# Patient Record
Sex: Male | Born: 1945
Health system: Southern US, Community
[De-identification: ages and names within clinical notes are randomized; demographics above are authoritative.]

## PROBLEM LIST (undated history)

## (undated) DIAGNOSIS — I10 Essential (primary) hypertension: Secondary | ICD-10-CM

## (undated) DIAGNOSIS — K5790 Diverticulosis of intestine, part unspecified, without perforation or abscess without bleeding: Secondary | ICD-10-CM

## (undated) DIAGNOSIS — K219 Gastro-esophageal reflux disease without esophagitis: Secondary | ICD-10-CM

## (undated) DIAGNOSIS — J45909 Unspecified asthma, uncomplicated: Secondary | ICD-10-CM

## (undated) DIAGNOSIS — E785 Hyperlipidemia, unspecified: Secondary | ICD-10-CM

## (undated) DIAGNOSIS — M1990 Unspecified osteoarthritis, unspecified site: Secondary | ICD-10-CM

## (undated) DIAGNOSIS — C801 Malignant (primary) neoplasm, unspecified: Secondary | ICD-10-CM

## (undated) DIAGNOSIS — N400 Enlarged prostate without lower urinary tract symptoms: Secondary | ICD-10-CM

## (undated) HISTORY — DX: Gastro-esophageal reflux disease without esophagitis: K21.9

## (undated) HISTORY — DX: Essential (primary) hypertension: I10

## (undated) HISTORY — DX: Malignant (primary) neoplasm, unspecified: C80.1

## (undated) HISTORY — DX: Unspecified asthma, uncomplicated: J45.909

## (undated) HISTORY — DX: Unspecified osteoarthritis, unspecified site: M19.90

## (undated) HISTORY — DX: Benign prostatic hyperplasia without lower urinary tract symptoms: N40.0

## (undated) HISTORY — DX: Hyperlipidemia, unspecified: E78.5

## (undated) HISTORY — DX: Diverticulosis of intestine, part unspecified, without perforation or abscess without bleeding: K57.90

## (undated) HISTORY — PX: COLONOSCOPY: SHX174

---

## 2000-01-25 ENCOUNTER — Emergency Department (HOSPITAL_COMMUNITY): Admission: EM | Admit: 2000-01-25 | Discharge: 2000-01-25 | Payer: Self-pay | Admitting: Emergency Medicine

## 2000-02-20 ENCOUNTER — Encounter: Payer: Self-pay | Admitting: Gastroenterology

## 2001-12-30 ENCOUNTER — Ambulatory Visit (HOSPITAL_COMMUNITY): Admission: RE | Admit: 2001-12-30 | Discharge: 2001-12-30 | Payer: Self-pay | Admitting: *Deleted

## 2004-07-28 ENCOUNTER — Ambulatory Visit: Payer: Self-pay | Admitting: Gastroenterology

## 2004-08-19 ENCOUNTER — Ambulatory Visit: Payer: Self-pay | Admitting: Gastroenterology

## 2004-08-19 LAB — HM COLONOSCOPY: HM Colonoscopy: NORMAL

## 2005-12-25 ENCOUNTER — Emergency Department (HOSPITAL_COMMUNITY): Admission: EM | Admit: 2005-12-25 | Discharge: 2005-12-26 | Payer: Self-pay | Admitting: Emergency Medicine

## 2006-03-27 ENCOUNTER — Ambulatory Visit: Payer: Self-pay | Admitting: Family Medicine

## 2006-03-29 ENCOUNTER — Ambulatory Visit: Payer: Self-pay | Admitting: Family Medicine

## 2006-06-11 ENCOUNTER — Ambulatory Visit: Payer: Self-pay | Admitting: Family Medicine

## 2006-08-13 ENCOUNTER — Ambulatory Visit: Payer: Self-pay | Admitting: Family Medicine

## 2006-12-31 ENCOUNTER — Ambulatory Visit: Payer: Self-pay | Admitting: Family Medicine

## 2007-01-01 ENCOUNTER — Ambulatory Visit: Payer: Self-pay | Admitting: Family Medicine

## 2007-02-12 ENCOUNTER — Ambulatory Visit: Payer: Self-pay | Admitting: Family Medicine

## 2007-04-16 ENCOUNTER — Ambulatory Visit: Payer: Self-pay | Admitting: Family Medicine

## 2007-05-01 ENCOUNTER — Ambulatory Visit: Payer: Self-pay | Admitting: Family Medicine

## 2007-06-04 ENCOUNTER — Ambulatory Visit: Payer: Self-pay | Admitting: Family Medicine

## 2007-08-28 ENCOUNTER — Ambulatory Visit: Payer: Self-pay | Admitting: Family Medicine

## 2007-09-30 ENCOUNTER — Ambulatory Visit: Payer: Self-pay | Admitting: Family Medicine

## 2008-01-29 ENCOUNTER — Ambulatory Visit: Payer: Self-pay | Admitting: Family Medicine

## 2008-03-02 ENCOUNTER — Ambulatory Visit: Payer: Self-pay | Admitting: Family Medicine

## 2008-03-10 ENCOUNTER — Ambulatory Visit: Payer: Self-pay | Admitting: Family Medicine

## 2008-05-04 ENCOUNTER — Ambulatory Visit: Payer: Self-pay | Admitting: Family Medicine

## 2008-09-16 ENCOUNTER — Ambulatory Visit: Payer: Self-pay | Admitting: Family Medicine

## 2009-01-18 ENCOUNTER — Ambulatory Visit: Payer: Self-pay | Admitting: Family Medicine

## 2009-01-29 ENCOUNTER — Ambulatory Visit: Payer: Self-pay | Admitting: Family Medicine

## 2009-01-29 ENCOUNTER — Encounter: Payer: Self-pay | Admitting: Gastroenterology

## 2009-01-29 ENCOUNTER — Telehealth: Payer: Self-pay | Admitting: Gastroenterology

## 2009-02-01 DIAGNOSIS — E7800 Pure hypercholesterolemia, unspecified: Secondary | ICD-10-CM | POA: Insufficient documentation

## 2009-02-01 DIAGNOSIS — E1169 Type 2 diabetes mellitus with other specified complication: Secondary | ICD-10-CM | POA: Insufficient documentation

## 2009-02-01 DIAGNOSIS — E1159 Type 2 diabetes mellitus with other circulatory complications: Secondary | ICD-10-CM | POA: Insufficient documentation

## 2009-02-01 DIAGNOSIS — I10 Essential (primary) hypertension: Secondary | ICD-10-CM | POA: Insufficient documentation

## 2009-02-01 DIAGNOSIS — K5730 Diverticulosis of large intestine without perforation or abscess without bleeding: Secondary | ICD-10-CM | POA: Insufficient documentation

## 2009-02-01 DIAGNOSIS — J45909 Unspecified asthma, uncomplicated: Secondary | ICD-10-CM | POA: Insufficient documentation

## 2009-02-02 ENCOUNTER — Ambulatory Visit: Payer: Self-pay | Admitting: Gastroenterology

## 2009-02-02 DIAGNOSIS — K219 Gastro-esophageal reflux disease without esophagitis: Secondary | ICD-10-CM | POA: Insufficient documentation

## 2009-02-02 DIAGNOSIS — K921 Melena: Secondary | ICD-10-CM | POA: Insufficient documentation

## 2009-02-19 ENCOUNTER — Ambulatory Visit: Payer: Self-pay | Admitting: Gastroenterology

## 2009-09-15 ENCOUNTER — Ambulatory Visit: Payer: Self-pay | Admitting: Family Medicine

## 2009-11-01 ENCOUNTER — Ambulatory Visit: Payer: Self-pay | Admitting: Family Medicine

## 2009-11-04 ENCOUNTER — Ambulatory Visit: Payer: Self-pay | Admitting: Family Medicine

## 2009-12-06 ENCOUNTER — Ambulatory Visit: Payer: Self-pay | Admitting: Family Medicine

## 2010-02-23 ENCOUNTER — Ambulatory Visit: Payer: Self-pay | Admitting: Family Medicine

## 2010-03-15 ENCOUNTER — Ambulatory Visit: Admission: RE | Admit: 2010-03-15 | Discharge: 2010-06-13 | Payer: Self-pay | Admitting: Radiation Oncology

## 2010-06-13 ENCOUNTER — Ambulatory Visit: Admission: RE | Admit: 2010-06-13 | Discharge: 2010-06-30 | Payer: Self-pay | Admitting: Radiation Oncology

## 2010-07-28 ENCOUNTER — Ambulatory Visit: Payer: Self-pay | Admitting: Family Medicine

## 2010-08-31 ENCOUNTER — Ambulatory Visit
Admission: RE | Admit: 2010-08-31 | Discharge: 2010-08-31 | Payer: Self-pay | Source: Home / Self Care | Attending: Family Medicine | Admitting: Family Medicine

## 2010-09-12 ENCOUNTER — Ambulatory Visit
Admission: RE | Admit: 2010-09-12 | Discharge: 2010-09-12 | Payer: Self-pay | Source: Home / Self Care | Attending: Family Medicine | Admitting: Family Medicine

## 2010-12-26 ENCOUNTER — Other Ambulatory Visit: Payer: Self-pay

## 2010-12-26 ENCOUNTER — Telehealth: Payer: Self-pay | Admitting: Family Medicine

## 2010-12-26 NOTE — Telephone Encounter (Signed)
Called med in to [facility]

## 2010-12-26 NOTE — Telephone Encounter (Signed)
Called [facility] to put in omprazole 40 mg 1 qd # 90 with 4 rf

## 2010-12-30 NOTE — Procedures (Signed)
[facility]  Patient:    Ernest Hughes, Ernest Hughes Visit Number: [id number] MRN: [id number]          Service Type: END Location: ENDO Attending Physician:  Sabino Gasser Dictated by:   Sabino Gasser, M.D. Proc. Date: 12/30/01 Admit Date:  12/30/2001                             Procedure Report  PROCEDURE:  Colonoscopy.  INDICATION:  Colon cancer screening.  ANESTHESIA:  Demerol 70, Versed 7 mg.  DESCRIPTION OF PROCEDURE:  With the patient mildly sedated in the left lateral decubitus position, a rectal exam was performed which was unremarkable. Subsequently the Olympus videoscopic colonoscope was inserted into the rectum and passed under direct vision to the cecum, identified by the ileocecal valve and appendiceal orifice, both of which were photographed.  From this point, the colonoscope was slowly withdrawn, taking circumferential views of the entire colonic mucosa, stopping only in the rectum which appeared normal on direct and showed small hemorrhoids on retroflexed view.  The endoscope was straightened and withdrawn.  The patients vital signs and pulse oximeter remained stable.  The patient tolerated the procedure well without apparent complications.  FINDINGS:  Internal hemorrhoids, otherwise unremarkable examination.  PLAN:  Repeat examination in possibly 5-10 years. Dictated by:   Sabino Gasser, M.D. Attending Physician:  Sabino Gasser DD:  12/30/01 TD:  12/30/01 Job: [id number]

## 2011-03-02 ENCOUNTER — Encounter: Payer: Self-pay | Admitting: Family Medicine

## 2011-03-22 ENCOUNTER — Encounter: Payer: Self-pay | Admitting: Medical

## 2011-03-22 ENCOUNTER — Ambulatory Visit (INDEPENDENT_AMBULATORY_CARE_PROVIDER_SITE_OTHER): Payer: Medicare Other | Admitting: Medical

## 2011-03-22 VITALS — BP 130/88 | HR 60 | Temp 97.7°F | Resp 16 | Ht 69.5 in | Wt 156.0 lb

## 2011-03-22 DIAGNOSIS — T63441A Toxic effect of venom of bees, accidental (unintentional), initial encounter: Secondary | ICD-10-CM

## 2011-03-22 DIAGNOSIS — T6391XA Toxic effect of contact with unspecified venomous animal, accidental (unintentional), initial encounter: Secondary | ICD-10-CM

## 2011-03-22 MED ORDER — HYDROCORTISONE 2.5 % EX CREA: TOPICAL_CREAM | Freq: Two times a day (BID) | CUTANEOUS | Status: DC

## 2011-03-22 NOTE — Patient Instructions (Signed)
Apply ice pack to the sting locations  Hydrocortisone cream topically for itching.   Elevate the extremities to reduce swelling.  You can take 1/2- 1 tablet of OTC Benadryl once to twice daily.   If any area becomes more red, hard, painful, hot, or if you have fever, chills, nausea, call or return.  These would be signs of infection.

## 2011-03-22 NOTE — Progress Notes (Signed)
Subjective:   HPI Ernest Hughes is a 65 y.o. male who presents for bee stings.  He was outside Monday and got into some yellow jackets.  They stung him on left hand, left leg, lower back.  Used Benadryl, cream topically.  Wanted to come in to make sure stinger wasn't still in the leg.  Has some redness of the bites.   No other aggravating or relieving factors.  No other c/o.  The following portions of the patient's history were reviewed and updated as appropriate: allergies, current medications, past family history, past medical history, past social history, past surgical history and problem list.  Past Medical History  Diagnosis Date  . GERD (gastroesophageal reflux disease)   . Hypertension   . BPH (benign prostatic hyperplasia)   . Cancer     PROSTATE  . Diverticulosis     Review of Systems Gen: fever, chills, sweats GI: no nausea, vomiting Lungs: no SOB, wheezing Ext: no edema    Objective:   Physical Exam  General appearance: alert, no distress, WD/WN, AA male, pleasant Skin; left medial leg just proximal to knee with silver dollar size area of mild swelling and erythema, central sting marking, but not clear if small 1mm size dark area is stinger or just the puncture site.  There is similar smaller les erythematous patch on left dorsal wrist and lower right back both from sting.  No area is indurated or fluctuance.  They just appear inflamed.  No drainage.     Assessment :    Encounter Diagnosis  Name Primary?  . Bee sting Yes     Plan:    Script for 2.5% hydrocortisone given.  Call/return if not improved in 48 hours.   Patient Instructions  Apply ice pack to the sting locations  Hydrocortisone cream topically for itching.   Elevate the extremities to reduce swelling.  You can take 1/2- 1 tablet of OTC Benadryl once to twice daily.   If any area becomes more red, hard, painful, hot, or if you have fever, chills, nausea, call or return.  These would be signs of  infection.

## 2011-05-22 ENCOUNTER — Encounter: Payer: Self-pay | Admitting: Family Medicine

## 2011-05-22 ENCOUNTER — Ambulatory Visit (INDEPENDENT_AMBULATORY_CARE_PROVIDER_SITE_OTHER): Payer: Medicare Other | Admitting: Family Medicine

## 2011-05-22 VITALS — BP 134/86 | HR 60 | Ht 69.0 in | Wt 185.0 lb

## 2011-05-22 DIAGNOSIS — Z8546 Personal history of malignant neoplasm of prostate: Secondary | ICD-10-CM | POA: Insufficient documentation

## 2011-05-22 DIAGNOSIS — L723 Sebaceous cyst: Secondary | ICD-10-CM

## 2011-05-22 DIAGNOSIS — Z23 Encounter for immunization: Secondary | ICD-10-CM

## 2011-05-22 DIAGNOSIS — Z79899 Other long term (current) drug therapy: Secondary | ICD-10-CM

## 2011-05-22 DIAGNOSIS — C61 Malignant neoplasm of prostate: Secondary | ICD-10-CM

## 2011-05-22 DIAGNOSIS — K219 Gastro-esophageal reflux disease without esophagitis: Secondary | ICD-10-CM

## 2011-05-22 DIAGNOSIS — I10 Essential (primary) hypertension: Secondary | ICD-10-CM

## 2011-05-22 LAB — COMPREHENSIVE METABOLIC PANEL
Albumin: 4.2 g/dL (ref 3.5–5.2)
BUN: 16 mg/dL (ref 6–23)
CO2: 27 mEq/L (ref 19–32)
Glucose, Bld: 90 mg/dL (ref 70–99)
Potassium: 4.3 mEq/L (ref 3.5–5.3)
Sodium: 139 mEq/L (ref 135–145)
Total Protein: 7.2 g/dL (ref 6.0–8.3)

## 2011-05-22 LAB — LIPID PANEL
HDL: 49 mg/dL (ref >39)
LDL Cholesterol: 73 mg/dL (ref 0–99)
Total CHOL/HDL Ratio: 2.8 Ratio
VLDL: 14 mg/dL (ref 0–40)

## 2011-05-22 NOTE — Progress Notes (Signed)
Subjective:    Patient ID: Ernest Hughes, male    DOB: 02/10/1946, 65 y.o.   MRN: [id number]  HPI He is here for his initial Medicare examination. He is being followed by urology and has had radiation. Apparently his PSA is slowly rising. The note from urology was reviewed. He is scheduled for another PSA and possible bone scan in several months. His reflux is giving him very little difficulty Also said some difficulty with some rectal lesions that he would like me to look at. He is using Metamucil to help with some difficulty with constipation. He also said some difficulty with his left eye draining but has seen Dr. Dione Booze who has him on drops. He is retired and he and his wife are helping to take care of the grandchildren and also doing a lot of traveling.   Review of Systems  Constitutional: Negative.   HENT: Negative.   Eyes: Positive for discharge.  Respiratory: Negative.   Cardiovascular: Negative.   Gastrointestinal: Negative.   Musculoskeletal: Negative.   Skin: Negative.   Hematological: Negative.   Psychiatric/Behavioral: Negative.        Objective:   Physical Exam BP 134/86  Pulse 60  Ht 5\' 9"  (1.753 m)  Wt 185 lb (83.915 kg)  BMI 27.32 kg/m2  General Appearance:    Alert, cooperative, no distress, appears stated age  Head:    Normocephalic, without obvious abnormality, atraumatic  Eyes:    PERRL, conjunctiva/corneas clear, EOM's intact, fundi    benign  Ears:    Normal TM's and external ear canals  Nose:   Nares normal, mucosa normal, no drainage or sinus   tenderness  Throat:   Lips, mucosa, and tongue normal; teeth and gums normal  Neck:   Supple, no lymphadenopathy;  thyroid:  no   enlargement/tenderness/nodules; no carotid   bruit or JVD  Back:    Spine nontender, no curvature, ROM normal, no CVA     tenderness  Lungs:     Clear to auscultation bilaterally without wheezes, rales or     ronchi; respirations unlabored  Chest Wall:    No tenderness or deformity     Heart:    Regular rate and rhythm, S1 and S2 normal, no murmur, rub   or gallop  Breast Exam:    No chest wall tenderness, masses or gynecomastia  Abdomen:     Soft, non-tender, nondistended, normoactive bowel sounds,    no masses, no hepatosplenomegaly  Genitalia:    Normal male external genitalia without lesions.  Testicles without masses.  No inguinal hernias.  Rectal:    a small sebaceous cyst that was easily expressed this material is noted at the 9:00 position.   Extremities:   No clubbing, cyanosis or edema  Pulses:   2+ and symmetric all extremities  Skin:   Skin color, texture, turgor normal, no rashes or lesions  Lymph nodes:   Cervical, supraclavicular, and axillary nodes normal  Neurologic:   CNII-XII intact, normal strength, sensation and gait; reflexes 2+ and symmetric throughout          Psych:   Normal mood, affect, hygiene and grooming.           Assessment & Plan:   1. Prostate cancer  CBC with Differential, Comprehensive metabolic panel, Lipid panel  2. Sebaceous cyst    3. GERD (gastroesophageal reflux disease)    4. Hypertension  CBC with Differential, Comprehensive metabolic panel, Lipid panel  5. Encounter for long-term (current)  use of other medications  CBC with Differential, Comprehensive metabolic panel, Lipid panel   Encouraged him to take his medications separate from taking the Metamucil. He will continue his followup with urology. Recheck here as needed.

## 2011-05-23 LAB — CBC WITH DIFFERENTIAL/PLATELET
HCT: 42.8 % (ref 39.0–52.0)
Hemoglobin: 14.4 g/dL (ref 13.0–17.0)
Lymphs Abs: 1.2 10*3/uL (ref 0.7–4.0)
MCH: 29 pg (ref 26.0–34.0)
Monocytes Absolute: 0.3 10*3/uL (ref 0.1–1.0)
Monocytes Relative: 9 % (ref 3–12)
Neutro Abs: 2.1 10*3/uL (ref 1.7–7.7)
Neutrophils Relative %: 57 % (ref 43–77)
RBC: 4.97 MIL/uL (ref 4.22–5.81)

## 2011-09-13 ENCOUNTER — Telehealth: Payer: Self-pay | Admitting: Family Medicine

## 2011-09-13 MED ORDER — AMLODIPINE-OLMESARTAN 5-20 MG PO TABS: 1.0000 | ORAL_TABLET | Freq: Every day | ORAL | Status: DC

## 2011-09-13 NOTE — Telephone Encounter (Signed)
[facility] called in

## 2011-09-14 ENCOUNTER — Telehealth: Payer: Self-pay | Admitting: Internal Medicine

## 2011-09-14 MED ORDER — AMLODIPINE-OLMESARTAN 5-20 MG PO TABS: 1.0000 | ORAL_TABLET | Freq: Every day | ORAL | Status: DC

## 2011-09-14 NOTE — Telephone Encounter (Signed)
Sent to wrong pharmacy redid it and sent to [facility]

## 2011-10-10 ENCOUNTER — Encounter: Payer: Self-pay | Admitting: Family Medicine

## 2011-10-10 ENCOUNTER — Ambulatory Visit (INDEPENDENT_AMBULATORY_CARE_PROVIDER_SITE_OTHER): Payer: Medicare Other | Admitting: Family Medicine

## 2011-10-10 VITALS — BP 110/70 | HR 82 | Temp 97.8°F | Wt 188.0 lb

## 2011-10-10 DIAGNOSIS — J029 Acute pharyngitis, unspecified: Secondary | ICD-10-CM

## 2011-10-10 NOTE — Progress Notes (Signed)
  Subjective:    Patient ID: Ernest Hughes, male    DOB: 25-Nov-1945, 66 y.o.   MRN: [id number]  HPI He has a three-day history that started with diarrhea followed by sore throat and hoarse voice. No earache, fever, chills, cough or congestion until yesterday and now he has a nonproductive.   Review of Systems     Objective:   Physical Exam alert and in no distress. Tympanic membranes and canals are normal. Throat is clear. Tonsils are normal. Neck is supple without adenopathy or thyromegaly. Cardiac exam shows a regular sinus rhythm without murmurs or gallops. Lungs are clear to auscultation. Screen is negative       Assessment & Plan:   1. Sore throat  POCT rapid strep A  2. Pharyngitis, acute     Symptomatic care including using Afrin nasal spray at night.

## 2011-10-10 NOTE — Patient Instructions (Signed)
Afrin nasal spray only at night sleeping brief review nose.

## 2011-10-19 ENCOUNTER — Other Ambulatory Visit: Payer: Self-pay | Admitting: Family Medicine

## 2011-11-28 ENCOUNTER — Ambulatory Visit (INDEPENDENT_AMBULATORY_CARE_PROVIDER_SITE_OTHER): Payer: Medicare Other | Admitting: Family Medicine

## 2011-11-28 ENCOUNTER — Encounter: Payer: Self-pay | Admitting: Family Medicine

## 2011-11-28 VITALS — BP 130/82 | HR 72 | Wt 189.0 lb

## 2011-11-28 DIAGNOSIS — R1310 Dysphagia, unspecified: Secondary | ICD-10-CM

## 2011-11-28 DIAGNOSIS — D1779 Benign lipomatous neoplasm of other sites: Secondary | ICD-10-CM

## 2011-11-28 DIAGNOSIS — K130 Diseases of lips: Secondary | ICD-10-CM

## 2011-11-28 DIAGNOSIS — D171 Benign lipomatous neoplasm of skin and subcutaneous tissue of trunk: Secondary | ICD-10-CM

## 2011-11-28 NOTE — Patient Instructions (Signed)
Call when you need the patches for your trip. Keep him on the lesion on her lip. If it does not go away, I will send you to a dermatologist. Make sure you chew your food really well.

## 2011-11-28 NOTE — Progress Notes (Signed)
  Subjective:    Patient ID: Ernest Hughes, male    DOB: August 13, 1946, 66 y.o.   MRN: [id number]  HPI He is here for consult concerning several issues. He has a several week history of a slightly swollen area on the mid upper lip. He thinks it has gotten slightly smaller. He also has a lesion on his back and he would like me to look at. He also complains of intermittent difficulty with swallowing. He feels as if food is going down the wrong pipe however he describes drinking water and helping eliminate this symptom. He's had no nausea, vomiting, abdominal pain.   Review of Systems     Objective:   Physical Exam Alert and in no distress. Slightly erythematous raised lesion is noted over the midportion of the upper lip. It is approximately 0.5 cm in size. Exam of his back does show a round smooth movable lesion over the mid back area on the right approximately 4 cm in size      Assessment & Plan:   1. Lipoma of back   2. Dysphagia   3. Lip lesion    I reassured him that nothing needed to be done for the lipoma. He is to watch the lip lesion in his feet has difficulty, he will call for dermatology referral. In terms of the swallowing difficulty, iron recommended that he chew his food better and have smaller sizes. If he has further difficulty, he will call.

## 2011-12-26 ENCOUNTER — Telehealth: Payer: Self-pay | Admitting: Family Medicine

## 2011-12-26 NOTE — Telephone Encounter (Signed)
Called in per jcl 

## 2011-12-26 NOTE — Telephone Encounter (Signed)
Call in Transderm-Scop a total of 4 patches. Use one every 3 days. Have him and his wife both use them

## 2012-03-08 ENCOUNTER — Other Ambulatory Visit: Payer: Self-pay | Admitting: Family Medicine

## 2012-05-02 ENCOUNTER — Ambulatory Visit (INDEPENDENT_AMBULATORY_CARE_PROVIDER_SITE_OTHER): Payer: Medicare Other | Admitting: Family Medicine

## 2012-05-02 ENCOUNTER — Encounter: Payer: Self-pay | Admitting: Family Medicine

## 2012-05-02 VITALS — BP 118/70 | HR 60 | Wt 186.0 lb

## 2012-05-02 DIAGNOSIS — L259 Unspecified contact dermatitis, unspecified cause: Secondary | ICD-10-CM

## 2012-05-02 DIAGNOSIS — Z23 Encounter for immunization: Secondary | ICD-10-CM

## 2012-05-02 DIAGNOSIS — R600 Localized edema: Secondary | ICD-10-CM

## 2012-05-02 DIAGNOSIS — L309 Dermatitis, unspecified: Secondary | ICD-10-CM

## 2012-05-02 DIAGNOSIS — R609 Edema, unspecified: Secondary | ICD-10-CM

## 2012-05-02 NOTE — Progress Notes (Signed)
  Subjective:    Patient ID: Ernest Hughes, male    DOB: Jun 28, 1946, 66 y.o.   MRN: [id number]  HPI A week ago he noted swelling to the right face as well as numbness. He has seen no rash in this area. There's been no change with eating, earache, difficulty swallowing. He did use Benadryl. He also has some lesions present on his face underneath his beard that have been giving him difficulty.   Review of Systems     Objective:   Physical Exam The right masseter area does appear to be slightly more prominent than left however palpation shows no changes. No submandibular adenopathy or lesions. Throat is clear. Exam of the lips does show some slight erythema and raised lesions.       Assessment & Plan:   1. Dermatitis  Ambulatory referral to Dermatology  2. Facial edema     etiology of the facial lesions is unclear. The edema is probably perception rather than reality. Explained that I did not find anything wrong with his jaw, salivary glands or lymph nodes. Recommend watchful waiting for this. Flu shot was given. Discussed benefits and risks.

## 2012-06-17 ENCOUNTER — Encounter: Payer: Self-pay | Admitting: Family Medicine

## 2012-06-17 ENCOUNTER — Ambulatory Visit (INDEPENDENT_AMBULATORY_CARE_PROVIDER_SITE_OTHER): Payer: Medicare Other | Admitting: Family Medicine

## 2012-06-17 VITALS — BP 128/80 | HR 60 | Wt 188.0 lb

## 2012-06-17 DIAGNOSIS — H6120 Impacted cerumen, unspecified ear: Secondary | ICD-10-CM

## 2012-06-17 DIAGNOSIS — K118 Other diseases of salivary glands: Secondary | ICD-10-CM

## 2012-06-17 NOTE — Progress Notes (Signed)
  Subjective:    Patient ID: Ernest Hughes, male    DOB: 06/07/1946, 66 y.o.   MRN: [id number]  HPI He is again having difficulty with fullness sensation in the right mandibular area. He cannot particularly relate this to any particular foods or activities. He does complain of some decreased hearing on the right.  Review of Systems     Objective:   Physical Exam The right tympanic membrane is normal after cerumen was removed from the ear. Left canal and TM normal. Full since sedation noted in the right salivary gland area. No tenderness to palpation. No submandibular adenopathy or lesions. Exam of the oral mucosa shows no lesions.       Assessment & Plan:   1. Salivary duct obstruction   2. Cerumen impaction    recommend he use lemon drops regularly for the next several days and if continued difficulty, ENT referral will be made.

## 2012-06-17 NOTE — Patient Instructions (Signed)
Use lemon drops for the next several days and if no improvement, call me and I will refer you to ENT

## 2012-06-21 ENCOUNTER — Ambulatory Visit (INDEPENDENT_AMBULATORY_CARE_PROVIDER_SITE_OTHER): Payer: Medicare Other | Admitting: Family Medicine

## 2012-06-21 DIAGNOSIS — H1130 Conjunctival hemorrhage, unspecified eye: Secondary | ICD-10-CM

## 2012-06-21 DIAGNOSIS — K118 Other diseases of salivary glands: Secondary | ICD-10-CM

## 2012-06-21 NOTE — Progress Notes (Signed)
  Subjective:    Patient ID: Ernest Hughes, male    DOB: 1946-03-05, 66 y.o.   MRN: [id number]  HPI He is here for consult concerning redness in his right eye. He has no history of injury to this. He's had no coughing or sneezing. He also continues to have difficulty with right mandibular swelling that has not responded to limit drops.   Review of Systems     Objective:   Physical Exam Alert and in no distress. Right conjunctiva is red. Anterior chamber and cornea is normal. The right mandibular area is still slightly swollen.       Assessment & Plan:   1. Conjunctival hemorrhage    2. Other specified diseases of the salivary glands  Ambulatory referral to ENT   I reassured him that the hemorrhage was probably spontaneous another indication of any underlying disease. Recommended watchful waiting.

## 2012-09-12 ENCOUNTER — Telehealth: Payer: Self-pay | Admitting: Family Medicine

## 2012-09-12 MED ORDER — ATORVASTATIN CALCIUM 10 MG PO TABS: 10.0000 mg | ORAL_TABLET | Freq: Every day | ORAL | Status: DC

## 2012-09-12 NOTE — Telephone Encounter (Signed)
done

## 2012-10-17 ENCOUNTER — Encounter: Payer: Self-pay | Admitting: Family Medicine

## 2012-10-17 ENCOUNTER — Ambulatory Visit (INDEPENDENT_AMBULATORY_CARE_PROVIDER_SITE_OTHER): Payer: Medicare Other | Admitting: Family Medicine

## 2012-10-17 VITALS — BP 130/80 | HR 72 | Temp 98.5°F | Wt 186.0 lb

## 2012-10-17 DIAGNOSIS — A084 Viral intestinal infection, unspecified: Secondary | ICD-10-CM

## 2012-10-17 DIAGNOSIS — A088 Other specified intestinal infections: Secondary | ICD-10-CM

## 2012-10-17 NOTE — Patient Instructions (Signed)
Viral Gastroenteritis Viral gastroenteritis is also known as stomach flu. This condition affects the stomach and intestinal tract. It can cause sudden diarrhea and vomiting. The illness typically lasts 3 to 8 days. Most people develop an immune response that eventually gets rid of the virus. While this natural response develops, the virus can make you quite ill. CAUSES  Many different viruses can cause gastroenteritis, such as rotavirus or noroviruses. You can catch one of these viruses by consuming contaminated food or water. You may also catch a virus by sharing utensils or other personal items with an infected person or by touching a contaminated surface. SYMPTOMS  The most common symptoms are diarrhea and vomiting. These problems can cause a severe loss of body fluids (dehydration) and a body salt (electrolyte) imbalance. Other symptoms may include:  Fever.  Headache.  Fatigue.  Abdominal pain. DIAGNOSIS  Your caregiver can usually diagnose viral gastroenteritis based on your symptoms and a physical exam. A stool sample may also be taken to test for the presence of viruses or other infections. TREATMENT  This illness typically goes away on its own. Treatments are aimed at rehydration. The most serious cases of viral gastroenteritis involve vomiting so severely that you are not able to keep fluids down. In these cases, fluids must be given through an intravenous line (IV). HOME CARE INSTRUCTIONS   Drink enough fluids to keep your urine clear or pale yellow. Drink small amounts of fluids frequently and increase the amounts as tolerated.  Ask your caregiver for specific rehydration instructions.  Avoid:  Foods high in sugar.  Alcohol.  Carbonated drinks.  Tobacco.  Juice.  Caffeine drinks.  Extremely hot or cold fluids.  Fatty, greasy foods.  Too much intake of anything at one time.  Dairy products until 24 to 48 hours after diarrhea stops.  You may consume probiotics.  Probiotics are active cultures of beneficial bacteria. They may lessen the amount and number of diarrheal stools in adults. Probiotics can be found in yogurt with active cultures and in supplements.  Wash your hands well to avoid spreading the virus.  Only take over-the-counter or prescription medicines for pain, discomfort, or fever as directed by your caregiver. Do not give aspirin to children. Antidiarrheal medicines are not recommended.  Ask your caregiver if you should continue to take your regular prescribed and over-the-counter medicines.  Keep all follow-up appointments as directed by your caregiver. SEEK IMMEDIATE MEDICAL CARE IF:   You are unable to keep fluids down.  You do not urinate at least once every 6 to 8 hours.  You develop shortness of breath.  You notice blood in your stool or vomit. This may look like coffee grounds.  You have abdominal pain that increases or is concentrated in one small area (localized).  You have persistent vomiting or diarrhea.  You have a fever.  The patient is a child younger than 3 months, and he or she has a fever.  The patient is a child older than 3 months, and he or she has a fever and persistent symptoms.  The patient is a child older than 3 months, and he or she has a fever and symptoms suddenly get worse.  The patient is a baby, and he or she has no tears when crying. MAKE SURE YOU:   Understand these instructions.  Will watch your condition.  Will get help right away if you are not doing well or get worse. Document Released: 07/31/2005 Document Revised: 10/23/2011 Document Reviewed: 05/17/2011   Exit[employer] Patient Information 2013 [place], [place]. Use Imodium help slow you down

## 2012-10-17 NOTE — Progress Notes (Signed)
  Subjective:    Patient ID: Ernest Hughes., male    DOB: Dec 10, 1945, 67 y.o.   MRN: [id number]  HPI He has a three-day history this started with headache and chills followed by diarrhea,4x day. No nausea or vomiting. No one else around him is gotten sick. He did eat tonight for a church but has not heard of anyone else getting sick. No recent travel. No history of recent antibiotic use. He has been using Advil for the headache.   Review of Systems     Objective:   Physical Exam alert and in no distress. Tympanic membranes and canals are normal. Throat is clear. Tonsils are normal. Neck is supple without adenopathy or thyromegaly. Cardiac exam shows a regular sinus rhythm without murmurs or gallops. Lungs are clear to auscultation. Donald exam shows active bowel sounds without masses or tenderness.        Assessment & Plan:  Viral gastroenteritis recommend supportive care. Information given concerning proper treatment in his AVS.

## 2012-12-09 ENCOUNTER — Other Ambulatory Visit: Payer: Self-pay

## 2012-12-09 MED ORDER — AMLODIPINE-OLMESARTAN 5-20 MG PO TABS: 1.0000 | ORAL_TABLET | Freq: Every day | ORAL | Status: DC

## 2012-12-09 NOTE — Telephone Encounter (Signed)
SENT IN [place]

## 2012-12-11 ENCOUNTER — Encounter: Payer: Self-pay | Admitting: Internal Medicine

## 2012-12-30 ENCOUNTER — Ambulatory Visit (INDEPENDENT_AMBULATORY_CARE_PROVIDER_SITE_OTHER): Payer: Medicare Other | Admitting: Family Medicine

## 2012-12-30 ENCOUNTER — Encounter: Payer: Self-pay | Admitting: Family Medicine

## 2012-12-30 VITALS — BP 114/70 | HR 68 | Ht 67.0 in | Wt 180.0 lb

## 2012-12-30 DIAGNOSIS — K219 Gastro-esophageal reflux disease without esophagitis: Secondary | ICD-10-CM

## 2012-12-30 DIAGNOSIS — K5730 Diverticulosis of large intestine without perforation or abscess without bleeding: Secondary | ICD-10-CM

## 2012-12-30 DIAGNOSIS — C61 Malignant neoplasm of prostate: Secondary | ICD-10-CM

## 2012-12-30 DIAGNOSIS — E7800 Pure hypercholesterolemia, unspecified: Secondary | ICD-10-CM

## 2012-12-30 DIAGNOSIS — I10 Essential (primary) hypertension: Secondary | ICD-10-CM

## 2012-12-30 DIAGNOSIS — Z79899 Other long term (current) drug therapy: Secondary | ICD-10-CM

## 2012-12-30 DIAGNOSIS — C44319 Basal cell carcinoma of skin of other parts of face: Secondary | ICD-10-CM

## 2012-12-30 DIAGNOSIS — C44310 Basal cell carcinoma of skin of unspecified parts of face: Secondary | ICD-10-CM

## 2012-12-30 LAB — CBC WITH DIFFERENTIAL/PLATELET
Basophils Absolute: 0 10*3/uL (ref 0.0–0.1)
Basophils Relative: 1 % (ref 0–1)
MCHC: 33.7 g/dL (ref 30.0–36.0)
Monocytes Absolute: 0.3 10*3/uL (ref 0.1–1.0)
Neutro Abs: 1.6 10*3/uL — ABNORMAL LOW (ref 1.7–7.7)
Neutrophils Relative %: 44 % (ref 43–77)
Platelets: 168 10*3/uL (ref 150–400)
RDW: 15.7 % — ABNORMAL HIGH (ref 11.5–15.5)

## 2012-12-30 LAB — POCT URINALYSIS DIPSTICK
Blood, UA: NEGATIVE
Glucose, UA: NEGATIVE
Ketones, UA: NEGATIVE
Spec Grav, UA: 1.02

## 2012-12-30 MED ORDER — ATORVASTATIN CALCIUM 10 MG PO TABS: 10.0000 mg | ORAL_TABLET | Freq: Every day | ORAL | Status: DC

## 2012-12-30 MED ORDER — OMEPRAZOLE 40 MG PO CPDR: DELAYED_RELEASE_CAPSULE | ORAL | Status: DC

## 2012-12-30 MED ORDER — AMLODIPINE-OLMESARTAN 5-20 MG PO TABS: 1.0000 | ORAL_TABLET | Freq: Every day | ORAL | Status: DC

## 2012-12-30 NOTE — Progress Notes (Signed)
  Subjective:    Patient ID: Ernest Hughes., male    DOB: 1945/09/12, 67 y.o.   MRN: [id number]  HPI He is here for a complete examination. He does need a refill on his blood pressure medication. He is up-to-date on his immunizations and has had a colonoscopy. He gets routine followup concerning his prostate cancer.  He does have reflux disease but does not have symptoms on a regular basis. Continues on his Lipitor without difficulties. He has had some discomfort with his right foot but finds that wearing an orthotic does help. He also has a lesion present on his nose that apparently is not healing properly. He is retired and enjoying his retirement. Family and social history were reviewed.   Review of Systems Negative except as above    Objective:   Physical Exam BP 114/70  Pulse 68  Ht 5\' 7"  (1.702 m)  Wt 180 lb (81.647 kg)  BMI 28.19 kg/m2  General Appearance:    Alert, cooperative, no distress, appears stated age  Head:    Normocephalic, without obvious abnormality, atraumatic  Eyes:    PERRL, conjunctiva/corneas clear, EOM's intact, fundi    benign  Ears:    Normal TM's and external ear canals  Nose:   Nares normal, mucosa normal, no drainage or sinus   tenderness  Throat:   Lips, mucosa, and tongue normal; teeth and gums normal  Neck:   Supple, no lymphadenopathy;  thyroid:  no   enlargement/tenderness/nodules; no carotid   bruit or JVD  Back:    Spine nontender, no curvature, ROM normal, no CVA     tenderness  Lungs:     Clear to auscultation bilaterally without wheezes, rales or     ronchi; respirations unlabored  Chest Wall:    No tenderness or deformity   Heart:    Regular rate and rhythm, S1 and S2 normal, no murmur, rub   or gallop  Breast Exam:    No chest wall tenderness, masses or gynecomastia  Abdomen:     Soft, non-tender, nondistended, normoactive bowel sounds,    no masses, no hepatosplenomegaly  Genitalia:  deferred  Rectal:  deferred  Extremities:   No  clubbing, cyanosis or edema  Pulses:   2+ and symmetric all extremities  Skin:   Skin color, texture, turgor normal, no rashes. A very small round slightly waxy lesion is noted on the tip of the nose.  Lymph nodes:   Cervical, supraclavicular, and axillary nodes normal  Neurologic:   CNII-XII intact, normal strength, sensation and gait; reflexes 2+ and symmetric throughout          Psych:   Normal mood, affect, hygiene and grooming.          Assessment & Plan:  HYPERTENSION - Plan: POCT urinalysis dipstick, amLODipine-olmesartan (AZOR) 5-20 MG per tablet, CBC with Differential, Comprehensive metabolic panel  GERD - Plan: omeprazole (PRILOSEC) 40 MG capsule  HYPERCHOLESTEROLEMIA - Plan: atorvastatin (LIPITOR) 10 MG tablet, Lipid panel  DIVERTICULOSIS, COLON  Prostate cancer  BCE (basal cell epithelioma), face - Plan: Ambulatory referral to Dermatology  Encounter for long-term (current) use of other medications - Plan: CBC with Differential, Comprehensive metabolic panel, Lipid panel I encouraged him to continue to take good care of himself. Also recommend using the PPI on an as-needed basis. He is also to use the orthotic on a regular basis.

## 2012-12-30 NOTE — Progress Notes (Signed)
DR.LUPTON [phone] January 20 2013 AT 9:30 AM

## 2012-12-30 NOTE — Patient Instructions (Addendum)
Use the medicine for acid reflux as needed. Use the arch supports. Don't change you stance  When you play golf

## 2012-12-31 LAB — COMPREHENSIVE METABOLIC PANEL
AST: 16 U/L (ref 0–37)
Albumin: 3.8 g/dL (ref 3.5–5.2)
Alkaline Phosphatase: 58 U/L (ref 39–117)
BUN: 14 mg/dL (ref 6–23)
Potassium: 4 mEq/L (ref 3.5–5.3)
Sodium: 140 mEq/L (ref 135–145)

## 2012-12-31 LAB — LIPID PANEL
HDL: 43 mg/dL (ref >39)
LDL Cholesterol: 82 mg/dL (ref 0–99)
Total CHOL/HDL Ratio: 3.3 Ratio

## 2012-12-31 NOTE — Progress Notes (Signed)
Quick Note:  CALLED PT CELL # LEFT MESSAGE LABS LOOK GOOD CONTINUE ON PRESENT MEDS AND TO REVIEW LABS PLEASE GO ON MY CHART ______

## 2013-03-13 ENCOUNTER — Encounter: Payer: Self-pay | Admitting: Family Medicine

## 2013-03-13 ENCOUNTER — Ambulatory Visit (INDEPENDENT_AMBULATORY_CARE_PROVIDER_SITE_OTHER): Payer: Medicare Other | Admitting: Family Medicine

## 2013-03-13 VITALS — BP 122/74 | HR 85 | Temp 98.2°F | Wt 187.0 lb

## 2013-03-13 DIAGNOSIS — J019 Acute sinusitis, unspecified: Secondary | ICD-10-CM

## 2013-03-13 DIAGNOSIS — R51 Headache: Secondary | ICD-10-CM

## 2013-03-13 DIAGNOSIS — R519 Headache, unspecified: Secondary | ICD-10-CM

## 2013-03-13 MED ORDER — AMOXICILLIN-POT CLAVULANATE 875-125 MG PO TABS: 1.0000 | ORAL_TABLET | Freq: Two times a day (BID) | ORAL | Status: DC

## 2013-03-13 MED ORDER — TRAMADOL HCL 50 MG PO TABS: 50.0000 mg | ORAL_TABLET | Freq: Three times a day (TID) | ORAL | Status: DC | PRN

## 2013-03-13 NOTE — Progress Notes (Signed)
  Subjective:    Patient ID: Ernest Hughes., male    DOB: 1946-04-13, 67 y.o.   MRN: [id number]  HPI Approximately 4 days ago he noted the onset of nasal congestion followed by headache that has slowly been getting worse, with fever chills and fatigue. He also complains of tooth discomfort. No earache, PND, sore throat. He also complains of left flank pain that is intermittent in nature the last 3 days. No nausea, vomiting, diarrhea, chest pain or shortness of breath. He does have a family reunion scheduled for tomorrow.   Review of Systems     Objective:   Physical Exam alert and in no distress. Tympanic membranes and canals are normal. Throat is clear. Tonsils are normal. Neck is supple without adenopathy or thyromegaly. Cardiac exam shows a regular sinus rhythm without murmurs or gallops. Lungs are clear to auscultation. No chest wall tenderness. Abdominal exam shows no masses or tenderness Nasal mucosa is normal but has tenderness over all sinuses. Exam of his chest and abdomen shows no palpable tenderness or lesions to     Assessment & Plan:  Acute sinusitis - Plan: amoxicillin-clavulanate (AUGMENTIN) 875-125 MG per tablet  Headache - Plan: traMADol (ULTRAM) 50 MG tablet  also discussed the rib pain and at this point the Aleve should help

## 2013-03-13 NOTE — Patient Instructions (Addendum)
Take 2 Aleve twice per day for the aches and pains. I will call in another pain medicine that you can take as well as the Aleve. Take all the antibiotic and if not only back to normal when you finish give me a call. Stay on all your other medications

## 2013-03-18 ENCOUNTER — Other Ambulatory Visit: Payer: Self-pay | Admitting: Family Medicine

## 2013-04-07 ENCOUNTER — Other Ambulatory Visit: Payer: Self-pay | Admitting: Family Medicine

## 2013-05-19 ENCOUNTER — Encounter: Payer: Self-pay | Admitting: Family Medicine

## 2013-05-19 ENCOUNTER — Ambulatory Visit (INDEPENDENT_AMBULATORY_CARE_PROVIDER_SITE_OTHER): Payer: Medicare Other | Admitting: Family Medicine

## 2013-05-19 VITALS — BP 124/74 | HR 88 | Wt 186.0 lb

## 2013-05-19 DIAGNOSIS — IMO0001 Reserved for inherently not codable concepts without codable children: Secondary | ICD-10-CM

## 2013-05-19 DIAGNOSIS — S6390XA Sprain of unspecified part of unspecified wrist and hand, initial encounter: Secondary | ICD-10-CM

## 2013-05-19 DIAGNOSIS — Z23 Encounter for immunization: Secondary | ICD-10-CM

## 2013-05-19 NOTE — Progress Notes (Signed)
  Subjective:    Patient ID: Ernest Hughes., male    DOB: 11-Apr-1946, 67 y.o.   MRN: [id number]  HPI He injured his left third finger at home. He describes having his finger fully extended in getting it caught and rotated. He complains of pain on the palmar surface of the PIP joint when he fully extends it.   Review of Systems     Objective:   Physical Exam Exam of the left third finger shows no swelling with normal motion. Full extension of all joints is normal however pain is elicited over the PIP joint with full extension. Full flexion of all joints is normal. Slight tenderness palpation over the palmar surface of the PIP joint.       Assessment & Plan:  Need for prophylactic vaccination and inoculation against influenza - Plan: Flu vaccine HIGH DOSE PF (Fluzone Tri High dose)  Finger strain, left, initial encounter  I explained that I think that he stretched and the flexor mechanism at the PIP joint but not to the extent that there is any permanent damage. Recommend conservative care mainly with buddy taping. Flu shot given with risks and benefits discussed.

## 2013-08-21 ENCOUNTER — Ambulatory Visit (INDEPENDENT_AMBULATORY_CARE_PROVIDER_SITE_OTHER): Payer: Medicare Other | Admitting: Family Medicine

## 2013-08-21 ENCOUNTER — Encounter: Payer: Self-pay | Admitting: Family Medicine

## 2013-08-21 VITALS — BP 128/84 | HR 81 | Wt 192.0 lb

## 2013-08-21 DIAGNOSIS — M79609 Pain in unspecified limb: Secondary | ICD-10-CM

## 2013-08-21 DIAGNOSIS — M79605 Pain in left leg: Secondary | ICD-10-CM

## 2013-08-21 NOTE — Progress Notes (Signed)
   Subjective:    Patient ID: Ernest Ora., male    DOB: 07-22-1946, 68 y.o.   MRN: [id number]  HPI He complains of a two-week history of left leg pain. He notes this especially at night. He does complain of a tingling sensation to the lateral thigh area. No weakness. He cannot relate this to position or physical activity. He cannot relate anything making it better or worse. He has tried minimal doses of ibuprofen with some benefit.  Review of Systems     Objective:   Physical Exam Alert and in no distress. Full motion of the hip, knees and ankles. Negative straight leg raising. Pulses were normal in the anterior tibial and dorsalis pedis pulse      Assessment & Plan:  Left leg pain  I will have him use 800 mg 3 times a day of ibuprofen and pay attention to anything that makes this better or worse. Reassured him that I found nothing of any major significance. He will return if symptoms continue.

## 2013-08-21 NOTE — Patient Instructions (Signed)
Take 4 Advil 3 times per day for the next 10 days and pay attention to anything that makes it better or worse

## 2013-09-30 ENCOUNTER — Other Ambulatory Visit: Payer: Self-pay | Admitting: Family Medicine

## 2013-12-12 HISTORY — PX: BELPHAROPTOSIS REPAIR: SHX369

## 2013-12-31 ENCOUNTER — Other Ambulatory Visit: Payer: Self-pay | Admitting: Family Medicine

## 2013-12-31 NOTE — Telephone Encounter (Signed)
PATIENT NEEDS AN OFFICE VISIT ASAP.

## 2014-01-09 ENCOUNTER — Telehealth: Payer: Self-pay | Admitting: Family Medicine

## 2014-01-09 DIAGNOSIS — I10 Essential (primary) hypertension: Secondary | ICD-10-CM

## 2014-01-09 MED ORDER — AMLODIPINE-OLMESARTAN 5-20 MG PO TABS: 1.0000 | ORAL_TABLET | Freq: Every day | ORAL | Status: DC

## 2014-01-09 NOTE — Telephone Encounter (Signed)
Fax refill request from [facility]  [phone] azor 5/20 #90 last dispensed on 10/13/13

## 2014-02-03 ENCOUNTER — Other Ambulatory Visit: Payer: Self-pay | Admitting: Family Medicine

## 2014-02-18 ENCOUNTER — Ambulatory Visit (INDEPENDENT_AMBULATORY_CARE_PROVIDER_SITE_OTHER): Payer: Medicare Other | Admitting: Family Medicine

## 2014-02-18 ENCOUNTER — Encounter: Payer: Self-pay | Admitting: Family Medicine

## 2014-02-18 VITALS — BP 122/72 | HR 72 | Temp 99.1°F | Ht 69.5 in | Wt 188.0 lb

## 2014-02-18 DIAGNOSIS — M545 Low back pain, unspecified: Secondary | ICD-10-CM

## 2014-02-18 DIAGNOSIS — R071 Chest pain on breathing: Secondary | ICD-10-CM

## 2014-02-18 DIAGNOSIS — M79605 Pain in left leg: Secondary | ICD-10-CM

## 2014-02-18 DIAGNOSIS — R0789 Other chest pain: Secondary | ICD-10-CM

## 2014-02-18 LAB — POCT URINALYSIS DIPSTICK
Bilirubin, UA: NEGATIVE
Glucose, UA: NEGATIVE
KETONES UA: NEGATIVE
LEUKOCYTES UA: NEGATIVE
NITRITE UA: NEGATIVE
Protein, UA: NEGATIVE
Spec Grav, UA: 1.015
Urobilinogen, UA: NEGATIVE
pH, UA: 5

## 2014-02-18 MED ORDER — NAPROXEN 500 MG PO TABS: ORAL_TABLET | ORAL | Status: DC

## 2014-02-18 NOTE — Patient Instructions (Signed)
Use heat 15 minutes three times daily.  I recommend doing stretches, and massaging the area after applying the heat. Avoid heavy lifting, bending, twisting. Drink plenty of fluids--I cannot guarantee that there isn't a tiny stone contributing to some of your pain, but I think that most all of your pain is muscular.  STOP taking advil.  Instead, take naproxen twice daily with food.  Continue to take your omeprazole daily. You may also use tylenol, if needed for pain, but don't use any other over-the-counter pain medications. If your muscular pain in the left lower back is not improving after a few days, then we should add in a muscle relaxant.  Call us if you feel that you need this.   Back Pain, Adult Low back pain is very common. About 1 in 5 people have back pain.The cause of low back pain is rarely dangerous. The pain often gets better over time.About half of people with a sudden onset of back pain feel better in just 2 weeks. About 8 in 10 people feel better by 6 weeks.  CAUSES Some common causes of back pain include:  Strain of the muscles or ligaments supporting the spine.  Wear and tear (degeneration) of the spinal discs.  Arthritis.  Direct injury to the back. DIAGNOSIS Most of the time, the direct cause of low back pain is not known.However, back pain can be treated effectively even when the exact cause of the pain is unknown.Answering your caregiver's questions about your overall health and symptoms is one of the most accurate ways to make sure the cause of your pain is not dangerous. If your caregiver needs more information, he or she may order lab work or imaging tests (X-rays or MRIs).However, even if imaging tests show changes in your back, this usually does not require surgery. HOME CARE INSTRUCTIONS For many people, back pain returns.Since low back pain is rarely dangerous, it is often a condition that people can learn to [facility] their own.   Remain active. It is  stressful on the back to sit or stand in one place. Do not sit, drive, or stand in one place for more than 30 minutes at a time. Take short walks on level surfaces as soon as pain allows.Try to increase the length of time you walk each day.  Do not stay in bed.Resting more than 1 or 2 days can delay your recovery.  Do not avoid exercise or work.Your body is made to move.It is not dangerous to be active, even though your back may hurt.Your back will likely heal faster if you return to being active before your pain is gone.  Pay attention to your body when you bend and lift. Many people have less discomfortwhen lifting if they bend their knees, keep the load close to their bodies,and avoid twisting. Often, the most comfortable positions are those that put less stress on your recovering back.  Find a comfortable position to sleep. Use a firm mattress and lie on your side with your knees slightly bent. If you lie on your back, put a pillow under your knees.  Only take over-the-counter or prescription medicines as directed by your caregiver. Over-the-counter medicines to reduce pain and inflammation are often the most helpful.Your caregiver may prescribe muscle relaxant drugs.These medicines help dull your pain so you can more quickly return to your normal activities and healthy exercise.  Put ice on the injured area.  Put ice in a plastic bag.  Place a towel between your skin  and the bag.  Leave the ice on for 15-20 minutes, 03-04 times a day for the first 2 to 3 days. After that, ice and heat may be alternated to reduce pain and spasms.  Ask your caregiver about trying back exercises and gentle massage. This may be of some benefit.  Avoid feeling anxious or stressed.Stress increases muscle tension and can worsen back pain.It is important to recognize when you are anxious or stressed and learn ways to manage it.Exercise is a great option. SEEK MEDICAL CARE IF:  You have pain that is  not relieved with rest or medicine.  You have pain that does not improve in 1 week.  You have new symptoms.  You are generally not feeling well. SEEK IMMEDIATE MEDICAL CARE IF:   You have pain that radiates from your back into your legs.  You develop new bowel or bladder control problems.  You have unusual weakness or numbness in your arms or legs.  You develop nausea or vomiting.  You develop abdominal pain.  You feel faint. Document Released: 07/31/2005 Document Revised: 01/30/2012 Document Reviewed: 12/19/2010 [facility] Patient Information 2015 [place], [place]. This information is not intended to replace advice given to you by your health care provider. Make sure you discuss any questions you have with your health care provider.

## 2014-02-18 NOTE — Progress Notes (Signed)
Chief Complaint  Patient presents with  . Hip Pain    started Sunday am. Has pain that goes from his low back on the left side through his hip and down his leg. He is concerned about his kidney. (UA showed trace blood)   Patient presents with complaint of pain at his left hip and down his left leg.  Sometimes he has a throbbing in his lateral ribs also. He is worried about his kidneys, due to his enlarged prostate.  He notes some increased frequency of urination, which he attributes to tea/caffeine, and improved when he cut back.  He sees Dr. Janice Norrie, and PSA was only slightly higher, has f/u scheduled with him in 6 months (he has h/o prostate cancer, treated with radiation). He is compliant with flomax, and feels like his bladder empties well.  He played golf 2 days prior to onset of pain, no known strain, injury or pain.  Sunday  Morning he woke up with the pain, had a hard time getting out of bed.  Pain has gotten progressively worse.  Last night the pain kept him up.  He hadn't played golf since May the 18th (date of eye surgery).  He tried tylenol at first, then started taking advil.  He took 2 advil twice on Monday with some relief of pain.  Yesterday he took 2 in the morning, 1 in the afternoon, and 1 during the night, due to pain.  He took 1 advil this morning.  Denies any abdominal pain--he is cautious about taking too much advil due to his reflux.  No h/o ulcer, just reflux.  He feels like the left thigh (laterally) is sometimes numb/tingly.  Denies any weakness in the leg.  He describes the pain in his lateral back/flank as "throbbing".  Past Medical History  Diagnosis Date  . GERD (gastroesophageal reflux disease)   . Hypertension   . BPH (benign prostatic hyperplasia)   . Cancer     PROSTATE--treated with radiation  . Diverticulosis    Past Surgical History  Procedure Laterality Date  . Colonoscopy      Dr. Sharlett Iles  . Belpharoptosis repair  12/2013    bilateral   History    Social History  . Marital Status: Married    Spouse Name: N/A    Number of Children: N/A  . Years of Education: N/A   Occupational History  . Not on file.   Social History Main Topics  . Smoking status: Never Smoker   . Smokeless tobacco: Never Used  . Alcohol Use: Yes     Comment: has some wine once in a while   . Drug Use: No  . Sexual Activity: Not Currently   Other Topics Concern  . Not on file   Social History Narrative  . No narrative on file   Outpatient Encounter Prescriptions as of 02/18/2014  Medication Sig Note  . amLODipine-olmesartan (AZOR) 5-20 MG per tablet Take 1 tablet by mouth daily.   Marland Kitchen aspirin 81 MG tablet Take 81 mg by mouth daily.     Marland Kitchen atorvastatin (LIPITOR) 10 MG tablet TAKE 1 TABLET BY MOUTH ONCE A DAY   . Ginkgo Biloba (GINKOBA) 40 MG TABS Take 40 mg by mouth daily.    Marland Kitchen ibuprofen (ADVIL,MOTRIN) 200 MG tablet Take 200 mg by mouth every 6 (six) hours as needed. 02/18/2014: Took one today  . omeprazole (PRILOSEC) 40 MG capsule TAKE 1 CAPSULE DAILY   . psyllium (METAMUCIL) 58.6 % powder Take 1  packet by mouth as needed.     . Tamsulosin HCl (FLOMAX) 0.4 MG CAPS Take 0.4 mg by mouth 2 (two) times daily.    . naproxen (NAPROSYN) 500 MG tablet Take regularly for a week, then as needed for pain   . [DISCONTINUED] traMADol (ULTRAM) 50 MG tablet Take 1 tablet (50 mg total) by mouth every 8 (eight) hours as needed for pain.    No Known Allergies  ROS:  Denies fevers, chills, dysuria, urgency, hematuria.  Denies bleeding, bruising, rash.  Denies nausea, vomiting, abdominal pain, bowel changes.  Denies LE weakness.  +mild intermittent numbness L lateral thigh.  No chest pain, palpitations, headaches, dizziness, shortness of breath or other complaints except as noted in HPI.  PHYSICAL EXAM: BP 122/72  Pulse 72  Temp(Src) 99.1 F (37.3 C) (Tympanic)  Ht 5' 9.5" (1.765 m)  Wt 188 lb (85.276 kg)  BMI 27.37 kg/m2 Pleasant, talkative male.  He has moderate  discomfort with position changes, "pulling" in his left lower back. Neck: no lymphadenopathy or spinal tenderness Back: Spine nontender.  No CVA tenderness, no SI joint tenderness.  Sciatic notch nontender.  Mild discomfort with pyriformis stretch on left, but full ROM.   Soft tissue swelling c/w lipoma right mid-lower back (unchanged per pt). Tender at left paraspinous muscles in lower lumbar region Pain laterally and in hamstring with SLR He is tender along his lower lateral ribs Heart: regular rate and rhythm Lungs: clear bilaterally Abdomen: mild tenderness L mid-abdomen. No rebound tenderness, guarding or mass nontender over trochanteric bursa, slight discomfort with IT band stretch, nontender. Neuro: alert and oriented. DTR's symmetric, normal strength, gait Psych: slightly anxious.  Normal hygiene, grooming, speech, eye contact.  Urine--trace blood, otherwise normal.  ASSESSMENT/PLAN:  LBP radiating to left leg - some mild muscle spasm on exam.  heat, stretches, NSAID, massage. consider adding muscle relaxant if not improving - Plan: POCT Urinalysis Dipstick  Left low back pain, with sciatica presence unspecified - Plan: naproxen (NAPROSYN) 500 MG tablet  Chest wall pain - left sided, at lower ribs.  likely muscular strain (related to golf?). Heat, NSAIDs  We discussed the trace blood in the urine (that wasn't present on last check, per computer). Can't rule out small stone, but his pain is more consistent with muscular pain.  Use heat 15 minutes three times daily.  I recommend doing stretches, and massaging the area after applying the heat. Avoid heavy lifting, bending, twisting. Drink plenty of fluids--I cannot guarantee that there isn't a tiny stone contributing to some of your pain, but I think that most all of your pain is muscular.  STOP taking advil.  Instead, take naproxen twice daily with food.  Continue to take your omeprazole daily. You may also use tylenol, if  needed for pain, but don't use any other over-the-counter pain medications. If your muscular pain in the left lower back is not improving after a few days, then we should add in a muscle relaxant.  Call us if you feel that you need this.  Risks/side effects of meds reviewed Ddx of his pain complaints were reviewed.

## 2014-03-03 ENCOUNTER — Other Ambulatory Visit: Payer: Self-pay | Admitting: Family Medicine

## 2014-03-03 NOTE — Telephone Encounter (Signed)
PLEASE, SCHEDULE A OFFICE VISIT TO FOLLOW UP ON YOUR MEDICATIONS ASAP.

## 2014-04-02 ENCOUNTER — Other Ambulatory Visit: Payer: Self-pay | Admitting: Family Medicine

## 2014-04-02 NOTE — Telephone Encounter (Signed)
CALLED AND LEFT MESSAGE ON PT CELL # THAT I COULD SEND IN 30 DAYS OF CHOLESTEROL MED BUT HE NEEDS TO CALL AND MAKE APPOINTMENT FOR MED CHECK OR PHYSICAL LABS NEED TO BE DONE

## 2014-04-02 NOTE — Telephone Encounter (Signed)
DONE

## 2014-05-05 ENCOUNTER — Ambulatory Visit (INDEPENDENT_AMBULATORY_CARE_PROVIDER_SITE_OTHER): Payer: Medicare Other | Admitting: Family Medicine

## 2014-05-05 ENCOUNTER — Encounter: Payer: Self-pay | Admitting: Family Medicine

## 2014-05-05 ENCOUNTER — Telehealth: Payer: Self-pay | Admitting: Family Medicine

## 2014-05-05 VITALS — BP 122/78 | HR 60 | Ht 69.0 in | Wt 189.0 lb

## 2014-05-05 DIAGNOSIS — Z0000 Encounter for general adult medical examination without abnormal findings: Secondary | ICD-10-CM

## 2014-05-05 DIAGNOSIS — E7800 Pure hypercholesterolemia, unspecified: Secondary | ICD-10-CM

## 2014-05-05 DIAGNOSIS — I10 Essential (primary) hypertension: Secondary | ICD-10-CM

## 2014-05-05 DIAGNOSIS — B351 Tinea unguium: Secondary | ICD-10-CM | POA: Insufficient documentation

## 2014-05-05 DIAGNOSIS — Z23 Encounter for immunization: Secondary | ICD-10-CM

## 2014-05-05 DIAGNOSIS — C61 Malignant neoplasm of prostate: Secondary | ICD-10-CM

## 2014-05-05 DIAGNOSIS — K219 Gastro-esophageal reflux disease without esophagitis: Secondary | ICD-10-CM

## 2014-05-05 LAB — POCT URINALYSIS DIPSTICK
Bilirubin, UA: NEGATIVE
Blood, UA: NEGATIVE
Glucose, UA: NEGATIVE
KETONES UA: NEGATIVE
Leukocytes, UA: NEGATIVE
Nitrite, UA: NEGATIVE
Protein, UA: NEGATIVE
Spec Grav, UA: 1.015
Urobilinogen, UA: NEGATIVE
pH, UA: 6

## 2014-05-05 MED ORDER — OMEPRAZOLE 40 MG PO CPDR: DELAYED_RELEASE_CAPSULE | ORAL | Status: DC

## 2014-05-05 MED ORDER — ATORVASTATIN CALCIUM 10 MG PO TABS: ORAL_TABLET | ORAL | Status: DC

## 2014-05-05 MED ORDER — AMLODIPINE-OLMESARTAN 5-20 MG PO TABS: 1.0000 | ORAL_TABLET | Freq: Every day | ORAL | Status: DC

## 2014-05-05 NOTE — Telephone Encounter (Signed)
Pt called advising his right arm is painful from the flu vaccine.  I advised to him to hold a cold compress to arm and take what ever he usually takes for pain.  He will call us tomorrow if no better.  He states he usually takes Advil.

## 2014-05-05 NOTE — Progress Notes (Signed)
Subjective:    Patient ID: Ernest Ora., male    DOB: [birth year], 68 y.o.   MRN: [id number]  HPI He is here for a complete examination. He has been under stress recently dealing with his children and grandchildren. He seems to have pulled himself away from this slightly by getting more involved in taking care of his own concerns and happiness. He does have prostate cancer and did have radiation on this. He is being followed by urology for this. He does have reflux symptoms and is doing well with every other day dosing. He continues on his but pressure medication without difficulty. He does have thickening of the nails and is wondering about medication for this. His immunizations are up to date. He would like a flu shot today. He had a colonoscopy this year. He is having some difficulty with anal discomfort and has been using an antifungal.   Review of Systems  All other systems reviewed and are negative.      Objective:   Physical Exam BP 122/78  Pulse 60  Ht 5\' 9"  (1.753 m)  Wt 189 lb (85.73 kg)  BMI 27.90 kg/m2  SpO2 99%  General Appearance:    Alert, cooperative, no distress, appears stated age  Head:    Normocephalic, without obvious abnormality, atraumatic  Eyes:    PERRL, conjunctiva/corneas clear, EOM's intact, fundi    benign  Ears:    Normal TM's and external ear canals  Nose:   Nares normal, mucosa normal, no drainage or sinus   tenderness  Throat:   Lips, mucosa, and tongue normal; teeth and gums normal  Neck:   Supple, no lymphadenopathy;  thyroid:  no   enlargement/tenderness/nodules; no carotid   bruit or JVD  Back:    Spine nontender, no curvature, ROM normal, no CVA     tenderness  Lungs:     Clear to auscultation bilaterally without wheezes, rales or     ronchi; respirations unlabored  Chest Wall:    No tenderness or deformity   Heart:    Regular rate and rhythm, S1 and S2 normal, no murmur, rub   or gallop  Breast Exam:    No chest wall tenderness, masses  or gynecomastia  Abdomen:     Soft, non-tender, nondistended, normoactive bowel sounds,    no masses, no hepatosplenomegaly    rectal exam shows no lesions.      Extremities:   No clubbing, cyanosis or edema  Pulses:   2+ and symmetric all extremities  Skin:   Skin color, texture, turgor normal, no rashes or lesions  Lymph nodes:   Cervical, supraclavicular, and axillary nodes normal  Neurologic:   CNII-XII intact, normal strength, sensation and gait; reflexes 2+ and symmetric throughout          Psych:   Normal mood, affect, hygiene and grooming.          Assessment & Plan:  Routine general medical examination at a health care facility - Plan: POCT Urinalysis Dipstick, CBC with Differential, Comprehensive metabolic panel, Lipid panel  Need for prophylactic vaccination against Streptococcus pneumoniae (pneumococcus) - Plan: Pneumococcal polysaccharide vaccine 23-valent greater than or equal to 2yo subcutaneous/IM  Need for prophylactic vaccination and inoculation against influenza - Plan: Flu vaccine HIGH DOSE PF (Fluzone Tri High dose)  Prostate cancer - Plan: PSA  HYPERTENSION - Plan: CBC with Differential, Comprehensive metabolic panel, amLODipine-olmesartan (AZOR) 5-20 MG per tablet  HYPERCHOLESTEROLEMIA - Plan: Lipid panel, atorvastatin (LIPITOR) 10  MG tablet  Gastroesophageal reflux disease without esophagitis - Plan: CBC with Differential, Comprehensive metabolic panel, omeprazole (PRILOSEC) 40 MG capsule  Onychomycosis  I discussed treatment of his toenails with medication and the length of time and he is decided not to pursue this. Recommend cortisone cream for the anal area. Continue on present medications.

## 2014-05-06 LAB — CBC WITH DIFFERENTIAL/PLATELET

## 2014-05-06 LAB — COMPREHENSIVE METABOLIC PANEL
ALT: 20 U/L (ref 0–53)
AST: 17 U/L (ref 0–37)
Albumin: 4.1 g/dL (ref 3.5–5.2)
Alkaline Phosphatase: 64 U/L (ref 39–117)
BUN: 18 mg/dL (ref 6–23)
CALCIUM: 9.1 mg/dL (ref 8.4–10.5)
CO2: 24 mEq/L (ref 19–32)
CREATININE: 1.16 mg/dL (ref 0.50–1.35)
Chloride: 104 mEq/L (ref 96–112)
Glucose, Bld: 84 mg/dL (ref 70–99)
Potassium: 4.4 mEq/L (ref 3.5–5.3)
Sodium: 138 mEq/L (ref 135–145)
Total Bilirubin: 0.5 mg/dL (ref 0.2–1.2)
Total Protein: 7.2 g/dL (ref 6.0–8.3)

## 2014-05-06 LAB — LIPID PANEL
CHOL/HDL RATIO: 2.8 ratio
Cholesterol: 139 mg/dL (ref 0–200)
HDL: 50 mg/dL (ref >39)
LDL Cholesterol: 74 mg/dL (ref 0–99)
TRIGLYCERIDES: 76 mg/dL (ref <150)
VLDL: 15 mg/dL (ref 0–40)

## 2014-05-06 LAB — PSA: PSA: 0.64 ng/mL (ref <=4.00)

## 2014-05-07 ENCOUNTER — Other Ambulatory Visit: Payer: Self-pay | Admitting: Family Medicine

## 2014-05-21 ENCOUNTER — Telehealth: Payer: Self-pay | Admitting: Internal Medicine

## 2014-05-21 NOTE — Telephone Encounter (Signed)
Pt called and states he received his blood work and wants to know why his CBC with Diff was cancelled. I asked Denice Paradise why it was cancelled and she said she is not sure but it states that the specimen clotted. Pt wants to know if he can come back in and get his cbc done again

## 2014-05-22 ENCOUNTER — Other Ambulatory Visit: Payer: Self-pay

## 2014-05-22 DIAGNOSIS — K2100 Gastro-esophageal reflux disease with esophagitis, without bleeding: Secondary | ICD-10-CM

## 2014-05-22 NOTE — Telephone Encounter (Signed)
Left message with his wife that he could call and make a nurse visit and have his cbc redrawn order are in system

## 2014-05-22 NOTE — Telephone Encounter (Signed)
Set this up 

## 2014-05-27 ENCOUNTER — Other Ambulatory Visit: Payer: Medicare Other

## 2014-05-27 DIAGNOSIS — K2100 Gastro-esophageal reflux disease with esophagitis, without bleeding: Secondary | ICD-10-CM

## 2014-05-27 LAB — CBC WITH DIFFERENTIAL/PLATELET
BASOS ABS: 0 10*3/uL (ref 0.0–0.1)
BASOS PCT: 1 % (ref 0–1)
EOS ABS: 0.1 10*3/uL (ref 0.0–0.7)
Eosinophils Relative: 2 % (ref 0–5)
HCT: 40.9 % (ref 39.0–52.0)
Hemoglobin: 13.7 g/dL (ref 13.0–17.0)
Lymphocytes Relative: 37 % (ref 12–46)
Lymphs Abs: 1.7 10*3/uL (ref 0.7–4.0)
MCH: 28.8 pg (ref 26.0–34.0)
MCHC: 33.5 g/dL (ref 30.0–36.0)
MCV: 85.9 fL (ref 78.0–100.0)
MONOS PCT: 8 % (ref 3–12)
Monocytes Absolute: 0.4 10*3/uL (ref 0.1–1.0)
NEUTROS ABS: 2.3 10*3/uL (ref 1.7–7.7)
NEUTROS PCT: 52 % (ref 43–77)
PLATELETS: 182 10*3/uL (ref 150–400)
RBC: 4.76 MIL/uL (ref 4.22–5.81)
RDW: 14.6 % (ref 11.5–15.5)
WBC: 4.5 10*3/uL (ref 4.0–10.5)

## 2014-06-03 ENCOUNTER — Encounter: Payer: Self-pay | Admitting: Family Medicine

## 2014-06-03 ENCOUNTER — Ambulatory Visit (INDEPENDENT_AMBULATORY_CARE_PROVIDER_SITE_OTHER): Payer: Medicare Other | Admitting: Family Medicine

## 2014-06-03 VITALS — BP 122/78 | HR 80 | Temp 98.8°F | Ht 69.25 in | Wt 190.0 lb

## 2014-06-03 DIAGNOSIS — M545 Low back pain, unspecified: Secondary | ICD-10-CM

## 2014-06-03 MED ORDER — NAPROXEN 500 MG PO TABS: 500.0000 mg | ORAL_TABLET | Freq: Two times a day (BID) | ORAL | Status: DC

## 2014-06-03 NOTE — Progress Notes (Signed)
Chief Complaint  Patient presents with  . back pain    hurt back playing golf yesterday. lower back pain. took tynelol once got back to house and then took 2 more later on. no relief   While swinging his driver playing golf yesterday, he developed acute onset of pain across the lower back (bilaterally).  Started as a stinging; wasn't able to bend down to pick up his tee, hard to straighten himself up.  He tried stretching some.  He was unable to complete the last 4 holes.  He took 2 ibuprofen when he got home from golf, which helped some.  He didn't try ice or heat.  When he got up at 3 am to go to the bathroom he had more pain, so he took more advil.  He feels better lying flat on his back than on his side. Currently pain is 6/10.  Denies radiation of pain, no numbness or tingling, denies weakness.  No bowel or bladder dysfunction.  PMH, PSH, SOC were reviewed  Outpatient Encounter Prescriptions as of 06/03/2014  Medication Sig Note  . amLODipine-olmesartan (AZOR) 5-20 MG per tablet Take 1 tablet by mouth daily.   Marland Kitchen aspirin 81 MG tablet Take 81 mg by mouth daily.     Marland Kitchen atorvastatin (LIPITOR) 10 MG tablet TAKE 1 TABLET BY MOUTH ONCE A DAY   . Ginkgo Biloba (GINKOBA) 40 MG TABS Take 40 mg by mouth daily.    Marland Kitchen omeprazole (PRILOSEC) 40 MG capsule TAKE 1 CAPSULE DAILY   . psyllium (METAMUCIL) 58.6 % powder Take 1 packet by mouth as needed.     . Tamsulosin HCl (FLOMAX) 0.4 MG CAPS Take 0.4 mg by mouth 2 (two) times daily.    Marland Kitchen ibuprofen (ADVIL,MOTRIN) 200 MG tablet Take 200 mg by mouth every 6 (six) hours as needed. 06/03/2014: Took 2 yesterday and 2 at 3am today  also has been taking Coricidin (last dose was 2 nights ago)  ROS:  Denies fevers, chills, numbness, tingling, weakness, bowel or bladder problems.  No URI symptoms, chest pain, shortness of breath or other concerns, except as noted in HPI  PHYSICAL EXAM:  BP 122/78  Pulse 80  Temp(Src) 98.8 F (37.1 C)  Ht 5' 9.25" (1.759 m)  Wt  190 lb (86.183 kg)  BMI 27.85 kg/m2  Well developed, pleasant older male in no distress Back: mild tenderness across entire lumbar spine.  He has mild tenderness at paraspinous muscles bilaterally (more lateral, not those right next to spine), only minimal spasm noted.  Very mild R SI joint tenderness. nontender at sciatic notch.  Strength 5/5, normal sensation.  DTR's 2+ and symmetric.  Negative straight leg raise  ASSESSMENT/PLAN:  Bilateral low back pain without sciatica - Plan: naproxen (NAPROSYN) 500 MG tablet  Suspect strain.  No evidence of radiculopathy. Supportive measures.  Reviewed proper lifting. NSAID precautions were reviewed with the patient.  Patient should take medication with food, discontinue if develops GI side effects, not to take other OTC NSAIDs at the same time, and not to use longer than recommended.   STOP taking advil.  Instead, take naproxen twice daily with food. Continue to take your omeprazole daily.  You may also use tylenol, if needed for pain, but don't use any other over-the-counter pain medications.  If your muscular pain in the left lower back is not improving after a few days, then we should add in a muscle relaxant. Call us if you feel that you need this.

## 2014-06-03 NOTE — Patient Instructions (Signed)
STOP taking advil.  Instead, take naproxen twice daily with food. Continue to take your omeprazole daily.  You may also use tylenol, if needed for pain, but don't use any other over-the-counter pain medications.  If your muscular pain in the left lower back is not improving after a few days, then we should add in a muscle relaxant. Call us if you feel that you need this.   Back Pain, Adult Low back pain is very common. About 1 in 5 people have back pain.The cause of low back pain is rarely dangerous. The pain often gets better over time.About half of people with a sudden onset of back pain feel better in just 2 weeks. About 8 in 10 people feel better by 6 weeks.  CAUSES Some common causes of back pain include:  Strain of the muscles or ligaments supporting the spine.  Wear and tear (degeneration) of the spinal discs.  Arthritis.  Direct injury to the back. DIAGNOSIS Most of the time, the direct cause of low back pain is not known.However, back pain can be treated effectively even when the exact cause of the pain is unknown.Answering your caregiver's questions about your overall health and symptoms is one of the most accurate ways to make sure the cause of your pain is not dangerous. If your caregiver needs more information, he or she may order lab work or imaging tests (X-rays or MRIs).However, even if imaging tests show changes in your back, this usually does not require surgery. HOME CARE INSTRUCTIONS For many people, back pain returns.Since low back pain is rarely dangerous, it is often a condition that people can learn to [facility] their own.   Remain active. It is stressful on the back to sit or stand in one place. Do not sit, drive, or stand in one place for more than 30 minutes at a time. Take short walks on level surfaces as soon as pain allows.Try to increase the length of time you walk each day.  Do not stay in bed.Resting more than 1 or 2 days can delay your  recovery.  Do not avoid exercise or work.Your body is made to move.It is not dangerous to be active, even though your back may hurt.Your back will likely heal faster if you return to being active before your pain is gone.  Pay attention to your body when you bend and lift. Many people have less discomfortwhen lifting if they bend their knees, keep the load close to their bodies,and avoid twisting. Often, the most comfortable positions are those that put less stress on your recovering back.  Find a comfortable position to sleep. Use a firm mattress and lie on your side with your knees slightly bent. If you lie on your back, put a pillow under your knees.  Only take over-the-counter or prescription medicines as directed by your caregiver. Over-the-counter medicines to reduce pain and inflammation are often the most helpful.Your caregiver may prescribe muscle relaxant drugs.These medicines help dull your pain so you can more quickly return to your normal activities and healthy exercise.  Put ice on the injured area.  Put ice in a plastic bag.  Place a towel between your skin and the bag.  Leave the ice on for 15-20 minutes, 03-04 times a day for the first 2 to 3 days. After that, ice and heat may be alternated to reduce pain and spasms.  Ask your caregiver about trying back exercises and gentle massage. This may be of some benefit.  Avoid feeling  anxious or stressed.Stress increases muscle tension and can worsen back pain.It is important to recognize when you are anxious or stressed and learn ways to manage it.Exercise is a great option. SEEK MEDICAL CARE IF:  You have pain that is not relieved with rest or medicine.  You have pain that does not improve in 1 week.  You have new symptoms.  You are generally not feeling well. SEEK IMMEDIATE MEDICAL CARE IF:   You have pain that radiates from your back into your legs.  You develop new bowel or bladder control problems.  You  have unusual weakness or numbness in your arms or legs.  You develop nausea or vomiting.  You develop abdominal pain.  You feel faint. Document Released: 07/31/2005 Document Revised: 01/30/2012 Document Reviewed: 12/02/2013 [facility] Patient Information 2015 [place], [place]. This information is not intended to replace advice given to you by your health care provider. Make sure you discuss any questions you have with your health care provider.

## 2015-01-08 ENCOUNTER — Other Ambulatory Visit: Payer: Self-pay | Admitting: Family Medicine

## 2015-02-01 ENCOUNTER — Ambulatory Visit (INDEPENDENT_AMBULATORY_CARE_PROVIDER_SITE_OTHER): Payer: Medicare Other | Admitting: Family Medicine

## 2015-02-01 ENCOUNTER — Encounter: Payer: Self-pay | Admitting: Family Medicine

## 2015-02-01 VITALS — BP 100/60 | HR 65 | Wt 192.0 lb

## 2015-02-01 DIAGNOSIS — K219 Gastro-esophageal reflux disease without esophagitis: Secondary | ICD-10-CM

## 2015-02-01 DIAGNOSIS — E7800 Pure hypercholesterolemia, unspecified: Secondary | ICD-10-CM

## 2015-02-01 DIAGNOSIS — R072 Precordial pain: Secondary | ICD-10-CM

## 2015-02-01 DIAGNOSIS — L503 Dermatographic urticaria: Secondary | ICD-10-CM | POA: Diagnosis not present

## 2015-02-01 DIAGNOSIS — I10 Essential (primary) hypertension: Secondary | ICD-10-CM | POA: Diagnosis not present

## 2015-02-01 LAB — CBC WITH DIFFERENTIAL/PLATELET
BASOS PCT: 0 % (ref 0–1)
Basophils Absolute: 0 10*3/uL (ref 0.0–0.1)
EOS ABS: 0.1 10*3/uL (ref 0.0–0.7)
EOS PCT: 3 % (ref 0–5)
HCT: 41.2 % (ref 39.0–52.0)
Hemoglobin: 13.5 g/dL (ref 13.0–17.0)
Lymphocytes Relative: 38 % (ref 12–46)
Lymphs Abs: 1.8 10*3/uL (ref 0.7–4.0)
MCH: 27.9 pg (ref 26.0–34.0)
MCHC: 32.8 g/dL (ref 30.0–36.0)
MCV: 85.1 fL (ref 78.0–100.0)
MONO ABS: 0.4 10*3/uL (ref 0.1–1.0)
MPV: 10.1 fL (ref 8.6–12.4)
Monocytes Relative: 8 % (ref 3–12)
NEUTROS ABS: 2.4 10*3/uL (ref 1.7–7.7)
Neutrophils Relative %: 51 % (ref 43–77)
Platelets: 175 10*3/uL (ref 150–400)
RBC: 4.84 MIL/uL (ref 4.22–5.81)
RDW: 15.4 % (ref 11.5–15.5)
WBC: 4.8 10*3/uL (ref 4.0–10.5)

## 2015-02-01 NOTE — Progress Notes (Signed)
   Subjective:    Patient ID: Ernest Ora., male    DOB: 04/08/1946, 69 y.o.   MRN: [id number]  HPI  His main concern today is that in the last 3 weeks he has had 4 episodes of chest and back pain with radiation into left shoulder and into his teeth. He would last roughly 4 minutes. There is no associated shortness of breath, weakness, diaphoresis. He cannot relate this to any particular activities. He notes nothing makes this better or worse. There is no family history of heart disease nor has he had a past history of difficulty. He does have underlying reflux symptoms But states that these symptoms are different.Marland Kitchen He also has hypercholesterolemia. He also is had difficulty with diffuse itching especially when he scratches an area. He did bring in pictures which do show evidence of dermatographia.   Review of Systems     Objective:   Physical Exam  Alert and in no distress. Cardiac exam shows regular rhythm without murmurs or gallops. Lungs are clear to auscultation. EKG shows no acute changes. It was compared with a previous EKG and there is essentially no change. Exam of his skin does show evidence of Dermatographia         Assessment & Plan:  HYPERCHOLESTEROLEMIA - Plan: Lipid panel  Essential hypertension - Plan: EKG 12-Lead, CBC with Differential/Platelet, Comprehensive metabolic panel  Gastroesophageal reflux disease without esophagitis  Precordial pain - Plan: EKG 12-Lead, CBC with Differential/Platelet, Comprehensive metabolic panel, Lipid panel  Dermatographia Discussed the use of an and his pain help with his skin. I will also refer him to cardiology to be safe. He did see a cardiologist in the past and will call me with the name. Also discussed possibility referring him to dermatology.

## 2015-02-02 ENCOUNTER — Telehealth: Payer: Self-pay | Admitting: Internal Medicine

## 2015-02-02 LAB — COMPREHENSIVE METABOLIC PANEL
ALK PHOS: 57 U/L (ref 39–117)
ALT: 12 U/L (ref 0–53)
AST: 13 U/L (ref 0–37)
Albumin: 3.7 g/dL (ref 3.5–5.2)
BILIRUBIN TOTAL: 0.4 mg/dL (ref 0.2–1.2)
BUN: 15 mg/dL (ref 6–23)
CO2: 29 meq/L (ref 19–32)
CREATININE: 1.07 mg/dL (ref 0.50–1.35)
Calcium: 8.7 mg/dL (ref 8.4–10.5)
Chloride: 102 mEq/L (ref 96–112)
Glucose, Bld: 67 mg/dL — ABNORMAL LOW (ref 70–99)
Potassium: 4.7 mEq/L (ref 3.5–5.3)
Sodium: 140 mEq/L (ref 135–145)
TOTAL PROTEIN: 6.6 g/dL (ref 6.0–8.3)

## 2015-02-02 LAB — LIPID PANEL
Cholesterol: 126 mg/dL (ref 0–200)
HDL: 48 mg/dL (ref >=40)
LDL Cholesterol: 38 mg/dL (ref 0–99)
Total CHOL/HDL Ratio: 2.6 Ratio
Triglycerides: 198 mg/dL — ABNORMAL HIGH (ref <150)
VLDL: 40 mg/dL (ref 0–40)

## 2015-02-02 NOTE — Telephone Encounter (Signed)
Okay but what for?

## 2015-02-02 NOTE — Telephone Encounter (Signed)
Dr.Lalonde what do I need to send patient to cardiology for

## 2015-02-02 NOTE — Telephone Encounter (Signed)
Pt needs a referral to cardiologist as he can not find the one he used to go to. Please call pt and let him know when the appt is scheduled. If he is does not answer you can leave a detailed message on his phone

## 2015-02-02 NOTE — Telephone Encounter (Signed)
Set him up to see Dr. Wynonia Lawman

## 2015-03-11 ENCOUNTER — Encounter: Payer: Self-pay | Admitting: Family Medicine

## 2015-04-01 ENCOUNTER — Other Ambulatory Visit: Payer: Self-pay | Admitting: Family Medicine

## 2015-04-20 ENCOUNTER — Encounter: Payer: Self-pay | Admitting: Family Medicine

## 2015-04-20 ENCOUNTER — Ambulatory Visit (INDEPENDENT_AMBULATORY_CARE_PROVIDER_SITE_OTHER): Payer: Medicare Other | Admitting: Family Medicine

## 2015-04-20 VITALS — BP 110/70 | HR 64 | Wt 189.4 lb

## 2015-04-20 DIAGNOSIS — R55 Syncope and collapse: Secondary | ICD-10-CM

## 2015-04-20 DIAGNOSIS — Z23 Encounter for immunization: Secondary | ICD-10-CM | POA: Diagnosis not present

## 2015-04-20 NOTE — Progress Notes (Signed)
   Subjective:    Patient ID: Ernest Ora., male    DOB: 1946-03-11, 69 y.o.   MRN: [id number]  HPI He is here for follow-up visit. He was seen on September 4 in the emergency room and evaluated for a syncopal episode. He did bring in the blood work and EKG. This was reviewed and both were normal. He does state that he did not eat breakfast the morning that this occurred nor did he drink his normal amounts of fluids. He was diagnosed with a vasovagal response.   Review of Systems     Objective:   Physical Exam Urgent and in no distress. Cardiac exam shows regular rhythm without murmurs or gallop her lungs are clear to auscultation.       Assessment & Plan:  Vasovagal reaction  Need for prophylactic vaccination and inoculation against influenza - Plan: Flu vaccine HIGH DOSE PF (Fluzone High dose)  I explained that this probably was a vasovagal response. Encouraged him to make sure that he does not miss breakfast and keep his fluid status adequate. Flu shot also given.

## 2015-04-29 ENCOUNTER — Other Ambulatory Visit: Payer: Self-pay | Admitting: Family Medicine

## 2015-05-10 ENCOUNTER — Ambulatory Visit (INDEPENDENT_AMBULATORY_CARE_PROVIDER_SITE_OTHER): Payer: Medicare Other | Admitting: Family Medicine

## 2015-05-10 ENCOUNTER — Encounter: Payer: Self-pay | Admitting: Family Medicine

## 2015-05-10 VITALS — BP 130/90 | HR 67 | Ht 69.0 in | Wt 187.0 lb

## 2015-05-10 DIAGNOSIS — I10 Essential (primary) hypertension: Secondary | ICD-10-CM | POA: Diagnosis not present

## 2015-05-10 DIAGNOSIS — K219 Gastro-esophageal reflux disease without esophagitis: Secondary | ICD-10-CM | POA: Diagnosis not present

## 2015-05-10 DIAGNOSIS — Z0000 Encounter for general adult medical examination without abnormal findings: Secondary | ICD-10-CM

## 2015-05-10 DIAGNOSIS — C61 Malignant neoplasm of prostate: Secondary | ICD-10-CM | POA: Diagnosis not present

## 2015-05-10 DIAGNOSIS — E7800 Pure hypercholesterolemia, unspecified: Secondary | ICD-10-CM

## 2015-05-10 MED ORDER — ATORVASTATIN CALCIUM 10 MG PO TABS: 10.0000 mg | ORAL_TABLET | Freq: Every day | ORAL | Status: DC

## 2015-05-10 MED ORDER — AMLODIPINE-OLMESARTAN 5-20 MG PO TABS: 1.0000 | ORAL_TABLET | Freq: Every day | ORAL | Status: DC

## 2015-05-10 NOTE — Progress Notes (Signed)
   Subjective:    Patient ID: Ernest Ora., male    DOB: 1946-05-19, 69 y.o.   MRN: [id number]  HPI He is here for a complete examination. He is enjoying his retirement. He does play golf and always has things to do on the "Honey Do" list! He and his wife are getting along quite well. He remains physically active with golf and other activities in and around the house. He does have an advanced directive. No issues with deconditioning or risk for fall. He does have a history of prostate cancer and gets follow-up concerning this. He also continues on his Lipitor as well as urologic medications and blood pressure medications. He has had recent blood work. Social and family history was also reviewed. He has had no difficulty with chest pain, shortness of breath, abdominal distress. Family and social history as well as immunizations and health maintenance was reviewed.   Review of Systems  All other systems reviewed and are negative.      Objective:   Physical Exam BP 130/90 mmHg  Pulse 67  Ht 5\' 9"  (1.753 m)  Wt 187 lb (84.823 kg)  BMI 27.60 kg/m2  SpO2 97%  General Appearance:    Alert, cooperative, no distress, appears stated age  Head:    Normocephalic, without obvious abnormality, atraumatic  Eyes:    PERRL, conjunctiva/corneas clear, EOM's intact, fundi    benign  Ears:    Normal TM's and external ear canals  Nose:   Nares normal, mucosa normal, no drainage or sinus   tenderness  Throat:   Lips, mucosa, and tongue normal; teeth and gums normal  Neck:   Supple, no lymphadenopathy;  thyroid:  no   enlargement/tenderness/nodules; no carotid   bruit or JVD  Back:    Spine nontender, no curvature, ROM normal, no CVA     tenderness  Lungs:     Clear to auscultation bilaterally without wheezes, rales or     ronchi; respirations unlabored  Chest Wall:    No tenderness or deformity   Heart:    Regular rate and rhythm, S1 and S2 normal, no murmur, rub   or gallop     Abdomen:      Soft, non-tender, nondistended, normoactive bowel sounds,    no masses, no hepatosplenomegaly        Extremities:   No clubbing, cyanosis or edema  Pulses:   2+ and symmetric all extremities  Skin:   Skin color, texture, turgor normal, no rashes or lesions  Lymph nodes:   Cervical, supraclavicular, and axillary nodes normal  Neurologic:   CNII-XII intact, normal strength, sensation and gait; reflexes 2+ and symmetric throughout          Psych:   Normal mood, affect, hygiene and grooming.          Assessment & Plan:  Routine general medical examination at a health care facility  Prostate cancer  HYPERCHOLESTEROLEMIA  Essential hypertension  Gastroesophageal reflux disease without esophagitis he is also due for colon cancer screening. He was given information concerning [employer]

## 2015-05-10 NOTE — Patient Instructions (Signed)
Try Axid Zantac or Pepcid and see if it works

## 2015-05-12 ENCOUNTER — Encounter: Payer: Self-pay | Admitting: Family Medicine

## 2015-05-25 LAB — COLOGUARD: COLOGUARD: NEGATIVE

## 2015-05-28 LAB — HM COLONOSCOPY: HM COLON: NORMAL

## 2015-05-31 ENCOUNTER — Telehealth: Payer: Self-pay

## 2015-05-31 NOTE — Telephone Encounter (Signed)
Called to inform pt wife cologaurd was negative

## 2015-06-02 ENCOUNTER — Encounter: Payer: Self-pay | Admitting: Family Medicine

## 2015-07-02 ENCOUNTER — Other Ambulatory Visit: Payer: Self-pay | Admitting: Family Medicine

## 2015-07-19 ENCOUNTER — Encounter: Payer: Self-pay | Admitting: Gastroenterology

## 2015-11-02 ENCOUNTER — Other Ambulatory Visit: Payer: Self-pay | Admitting: Family Medicine

## 2016-01-17 ENCOUNTER — Ambulatory Visit (INDEPENDENT_AMBULATORY_CARE_PROVIDER_SITE_OTHER): Payer: Medicare Other | Admitting: Family Medicine

## 2016-01-17 ENCOUNTER — Encounter: Payer: Self-pay | Admitting: Family Medicine

## 2016-01-17 VITALS — BP 126/82 | HR 70 | Wt 192.0 lb

## 2016-01-17 DIAGNOSIS — M5442 Lumbago with sciatica, left side: Secondary | ICD-10-CM

## 2016-01-17 NOTE — Patient Instructions (Signed)
Take 4 Advil 3 times per day and you can also take 2 Tylenol 4 times per day for pain relief

## 2016-01-17 NOTE — Progress Notes (Signed)
   Subjective:    Patient ID: Ernest Ora., male    DOB: 1946/07/26, 70 y.o.   MRN: [id number]  HPI he complains of a one-week history of left-sided low back pain. He has been slowly getting worse and has radiated down his left leg to the inner medial thigh area. He has had previous difficulty with low back pain and did respond to conservative care in the past. No numbness, tingling, weakness, bowel or bladder problems. He has been using Tylenol very sparingly.  Review of Systems     Objective:   Physical Exam Pain on motion of his back. No tenderness to palpation. DTRs normal. Straight leg raising positive at 45 with positive sciatic stretch. Normal strength.       Assessment & Plan:  Left-sided low back pain with left-sided sciatica - Plan: DG Lumbar Spine Complete He does have radicular symptoms at this point. Recommend anti-inflammatory. Discussed use of codeine but at this time he would like to hold off on this. I will follow-up in 10 days unless his symptoms worsen. Will also get LS spine. He is comfortable with this approach.

## 2016-01-18 ENCOUNTER — Other Ambulatory Visit: Payer: Self-pay

## 2016-01-18 ENCOUNTER — Ambulatory Visit
Admission: RE | Admit: 2016-01-18 | Discharge: 2016-01-18 | Disposition: A | Discharge status: Home / Self Care | Payer: Medicare Other | Source: Ambulatory Visit | Attending: Family Medicine | Admitting: Family Medicine

## 2016-01-18 DIAGNOSIS — M5442 Lumbago with sciatica, left side: Secondary | ICD-10-CM

## 2016-01-18 IMAGING — CR DG LUMBAR SPINE COMPLETE 4+V
5 series · 5 of 5 positions shown · non-contrast
Comparison: None available in PACs

CLINICAL DATA: Three weeks of worsening low back pain radiating
into the left buttock and leg with no known trauma

EXAM:
LUMBAR SPINE - COMPLETE 4+ VIEW

[w lumbar spine ap]
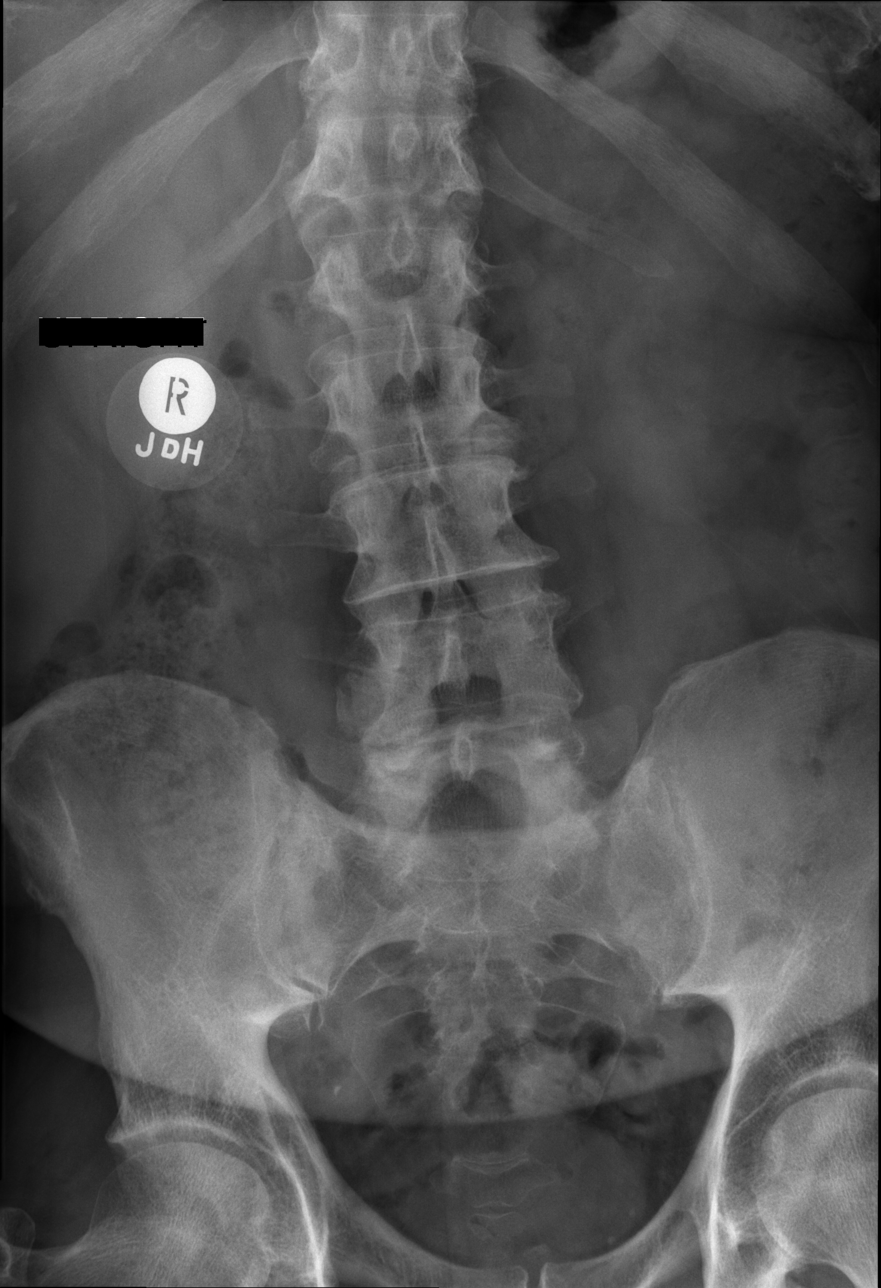

[w lumbar spine obl (1 of 2)]
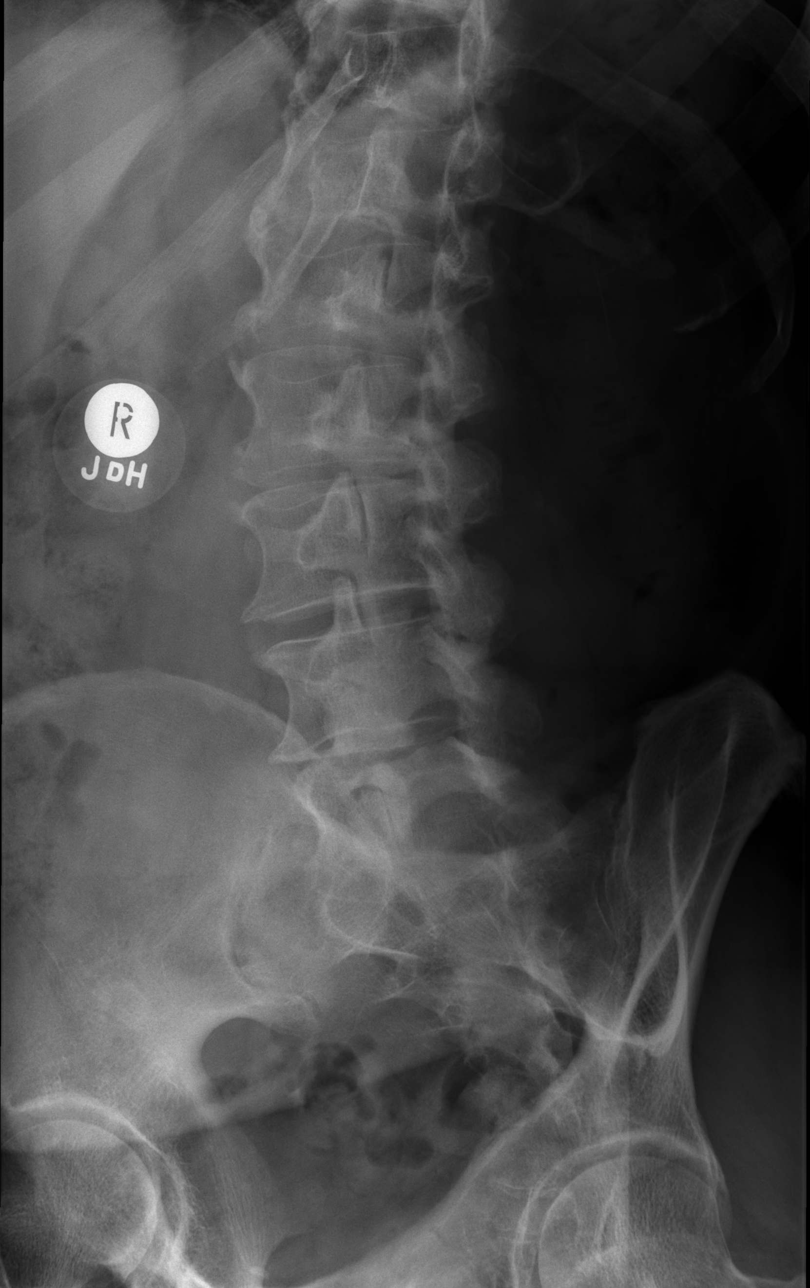

[w lumbar spine obl (2 of 2)]
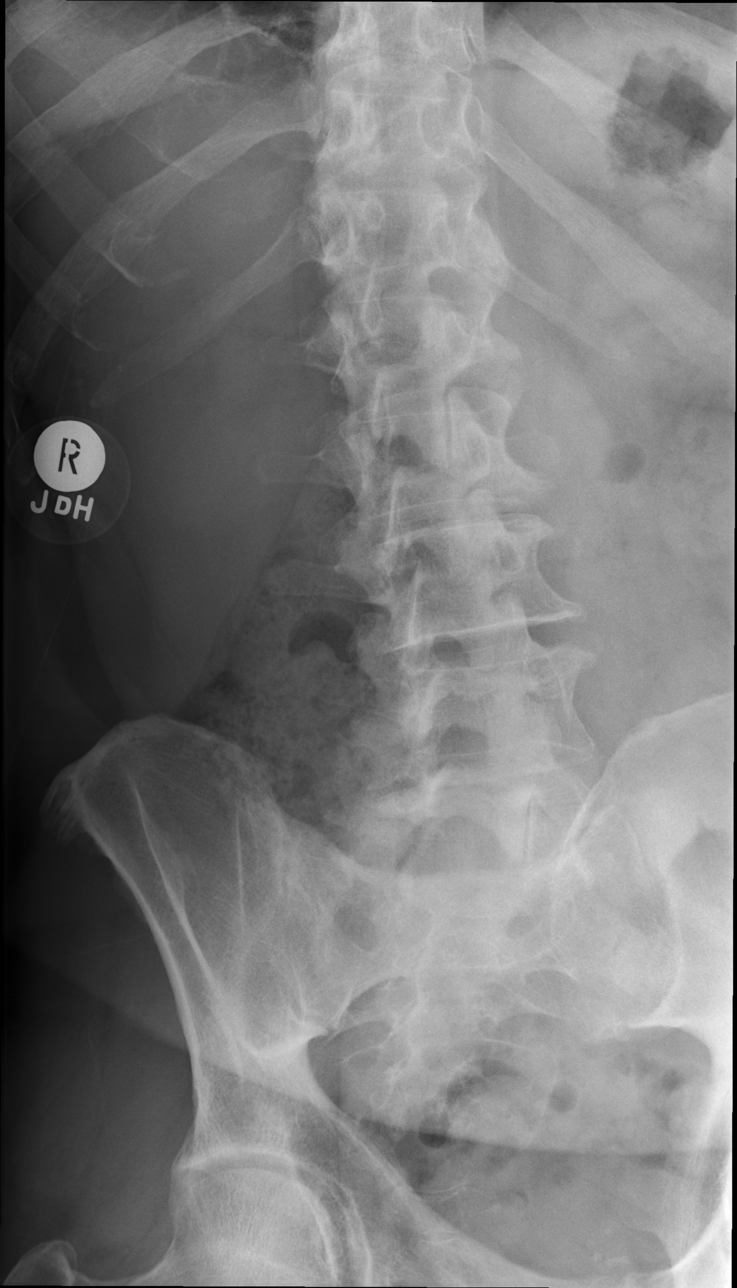

[w lumbar spine lat]
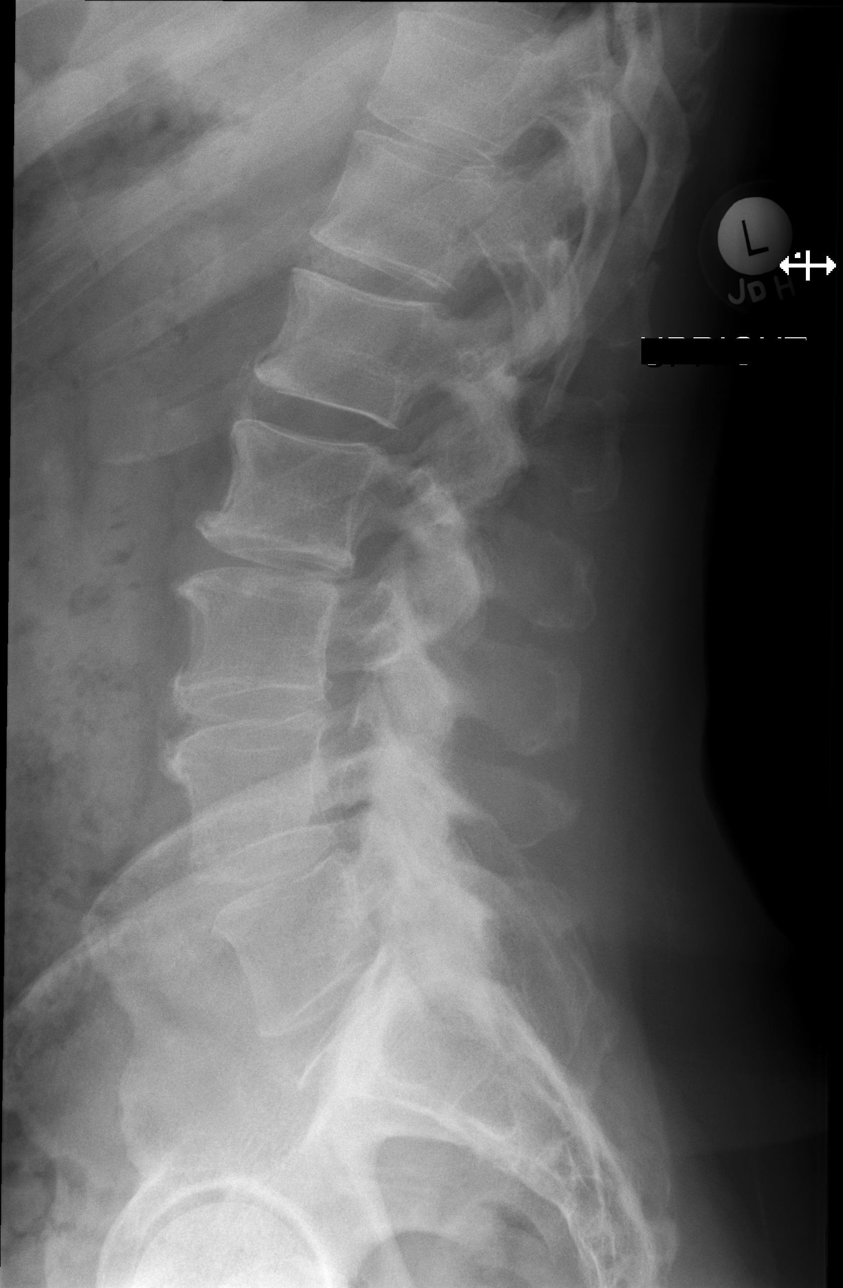

[w lumbar l-5 s-1 spot]
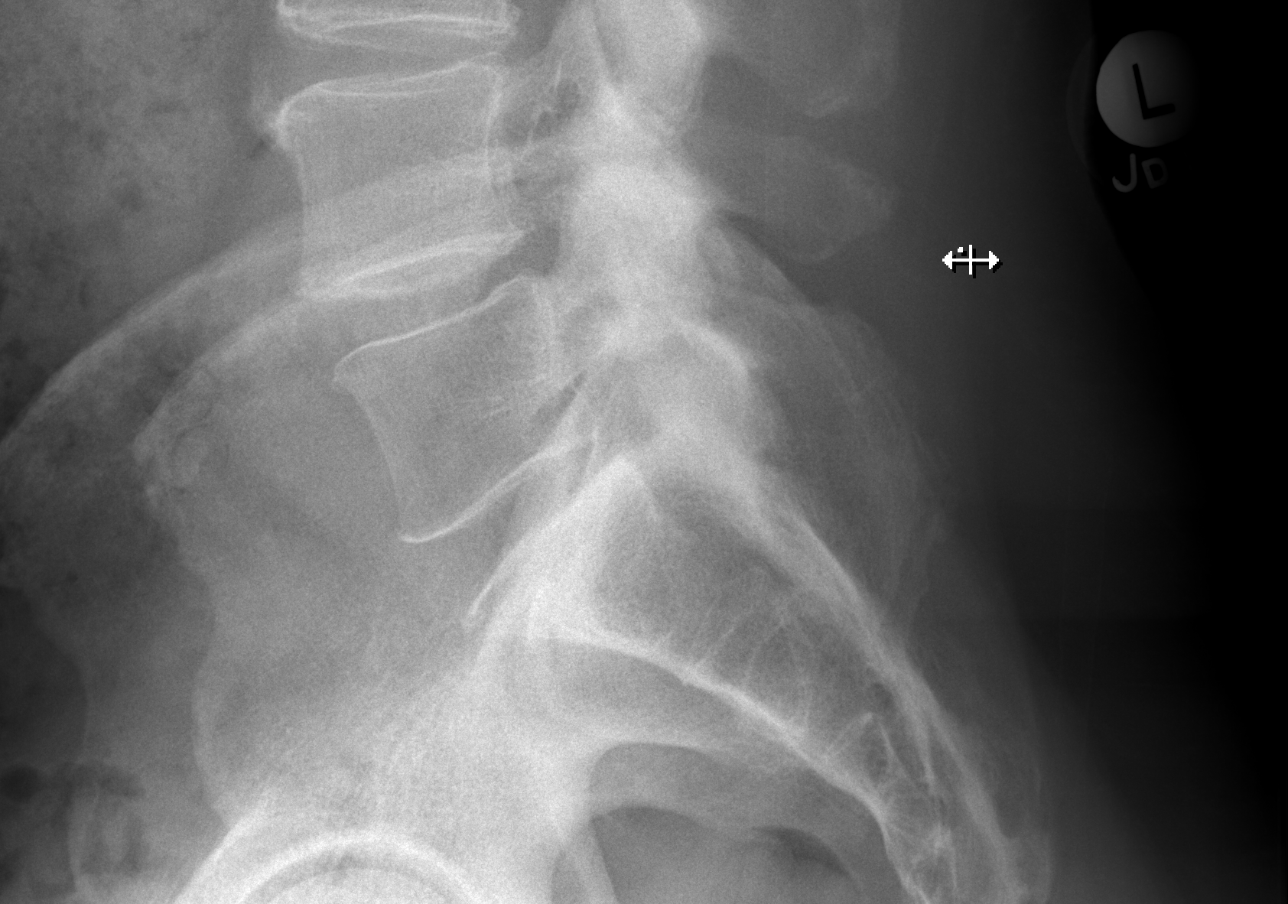

[5 of 5 positions shown; findings below may reference images not displayed]

FINDINGS: The lumbar vertebral bodies are preserved in height. There is mild
disc space narrowing at L2-3, L3-4, and L4-5. This is accentuated by
mild dextrocurvature centered at L2-3. The pedicles and transverse
processes are intact. There is facet joint hypertrophy at L4-5 and
at L5-S1. The sacrum and SI joints are unremarkable.
IMPRESSION: Mild degenerative disc disease at L2-3, L3-4, and L4-5 without
high-grade disc space narrowing. There is facet joint hypertrophy at
L4-5 and L5-S1.

If the patient's radicular symptoms persist, lumbar spine MRI may be
a useful next imaging step.

## 2016-01-19 ENCOUNTER — Telehealth: Payer: Self-pay | Admitting: Family Medicine

## 2016-01-19 NOTE — Telephone Encounter (Signed)
Pt called and stated that he got his results and has additional questions. He would like to speak to you. Please call pt at [phone]. Pt was informed that JCL not in office today.

## 2016-01-26 ENCOUNTER — Encounter: Payer: Self-pay | Admitting: Family Medicine

## 2016-01-26 ENCOUNTER — Ambulatory Visit (INDEPENDENT_AMBULATORY_CARE_PROVIDER_SITE_OTHER): Payer: Medicare Other | Admitting: Family Medicine

## 2016-01-26 VITALS — BP 104/70 | HR 67 | Wt 192.0 lb

## 2016-01-26 DIAGNOSIS — M25512 Pain in left shoulder: Secondary | ICD-10-CM | POA: Diagnosis not present

## 2016-01-26 DIAGNOSIS — M7552 Bursitis of left shoulder: Secondary | ICD-10-CM

## 2016-01-26 DIAGNOSIS — M5442 Lumbago with sciatica, left side: Secondary | ICD-10-CM

## 2016-01-26 MED ORDER — TRIAMCINOLONE ACETONIDE 40 MG/ML IJ SUSP
40.0000 mg | Freq: Once | INTRAMUSCULAR | Status: AC
  Administered 2016-01-26: 40 mg via INTRAMUSCULAR

## 2016-01-26 MED ORDER — LIDOCAINE HCL (PF) 1 % IJ SOLN
2.0000 mL | Freq: Once | INTRAMUSCULAR | Status: AC
  Administered 2016-01-26: 2 mL via INTRADERMAL

## 2016-01-26 NOTE — Patient Instructions (Signed)
Cutting back on the Advil and increase your physical activity based on discomfort

## 2016-01-26 NOTE — Progress Notes (Signed)
   Subjective:    Patient ID: Renie Ora., male    DOB: [birth year], 70 y.o.   MRN: [id number]  HPI He is here for a recheck. He states that he is feeling 75% better. He has been using a back brace. No numbness, tingling or weakness is noted. He also has a one-month history of difficulty with left shoulder pain especially with abduction and external rotation. He has had difficulty with this several years ago and did get relief with an injection. She had injury. No numbness tingling or weakness.   Review of Systems     Objective:   Physical Exam Back not examined. Straight leg raising is positive at 75. Left shoulder exam shows negative drop arm test and supraspinatus testing was uncomfortable. Slight crepitus was noted with near's and Hawkins test. No laxity noted.       Assessment & Plan:  Left shoulder pain - Plan: triamcinolone acetonide (KENALOG-40) injection 40 mg, lidocaine (PF) (XYLOCAINE) 1 % injection 2 mL  Left-sided low back pain with left-sided sciatica  Shoulder bursitis, left After discussion with him he has decided to have an injection. 40 mg of Kenalog and 3 mL of Xylocaine was injected into the left subacromial area. Small amount of blood was initially aspirated the stopped. It was slightly difficult to inject but he did get good relief of his symptoms relatively quickly. He will call if further trouble.  He will also continue with conservative care for his back slowly increasing his physical activity based on discomfort. I will not necessarily schedule him back unless he runs into some trouble.

## 2016-05-09 ENCOUNTER — Other Ambulatory Visit: Payer: Self-pay | Admitting: Family Medicine

## 2016-05-10 NOTE — Telephone Encounter (Signed)
LM on pt VCM that he is due for OV.

## 2016-06-16 ENCOUNTER — Encounter: Payer: Self-pay | Admitting: Family Medicine

## 2016-06-16 ENCOUNTER — Ambulatory Visit (INDEPENDENT_AMBULATORY_CARE_PROVIDER_SITE_OTHER): Payer: Medicare Other | Admitting: Family Medicine

## 2016-06-16 VITALS — BP 132/84 | HR 78 | Ht 70.0 in | Wt 190.0 lb

## 2016-06-16 DIAGNOSIS — E7800 Pure hypercholesterolemia, unspecified: Secondary | ICD-10-CM | POA: Diagnosis not present

## 2016-06-16 DIAGNOSIS — C61 Malignant neoplasm of prostate: Secondary | ICD-10-CM | POA: Diagnosis not present

## 2016-06-16 DIAGNOSIS — G8929 Other chronic pain: Secondary | ICD-10-CM | POA: Diagnosis not present

## 2016-06-16 DIAGNOSIS — Z1159 Encounter for screening for other viral diseases: Secondary | ICD-10-CM | POA: Diagnosis not present

## 2016-06-16 DIAGNOSIS — Z23 Encounter for immunization: Secondary | ICD-10-CM

## 2016-06-16 DIAGNOSIS — B351 Tinea unguium: Secondary | ICD-10-CM

## 2016-06-16 DIAGNOSIS — I10 Essential (primary) hypertension: Secondary | ICD-10-CM

## 2016-06-16 DIAGNOSIS — L738 Other specified follicular disorders: Secondary | ICD-10-CM | POA: Diagnosis not present

## 2016-06-16 DIAGNOSIS — M25512 Pain in left shoulder: Secondary | ICD-10-CM

## 2016-06-16 DIAGNOSIS — Z0000 Encounter for general adult medical examination without abnormal findings: Secondary | ICD-10-CM

## 2016-06-16 DIAGNOSIS — K219 Gastro-esophageal reflux disease without esophagitis: Secondary | ICD-10-CM | POA: Diagnosis not present

## 2016-06-16 LAB — CBC WITH DIFFERENTIAL/PLATELET
BASOS ABS: 0 {cells}/uL (ref 0–200)
Basophils Relative: 0 %
EOS PCT: 2 %
Eosinophils Absolute: 116 cells/uL (ref 15–500)
HCT: 41.9 % (ref 38.5–50.0)
Hemoglobin: 14 g/dL (ref 13.2–17.1)
Lymphocytes Relative: 43 %
Lymphs Abs: 2494 cells/uL (ref 850–3900)
MCH: 29.3 pg (ref 27.0–33.0)
MCHC: 33.4 g/dL (ref 32.0–36.0)
MCV: 87.7 fL (ref 80.0–100.0)
MONOS PCT: 8 %
MPV: 10.1 fL (ref 7.5–12.5)
Monocytes Absolute: 464 cells/uL (ref 200–950)
NEUTROS ABS: 2726 {cells}/uL (ref 1500–7800)
NEUTROS PCT: 47 %
PLATELETS: 191 10*3/uL (ref 140–400)
RBC: 4.78 MIL/uL (ref 4.20–5.80)
RDW: 14.8 % (ref 11.0–15.0)
WBC: 5.8 10*3/uL (ref 4.0–10.5)

## 2016-06-16 LAB — POCT URINALYSIS DIPSTICK
Bilirubin, UA: NEGATIVE
Blood, UA: NEGATIVE
Glucose, UA: NEGATIVE
KETONES UA: NEGATIVE
LEUKOCYTES UA: NEGATIVE
Nitrite, UA: NEGATIVE
PH UA: 7
Protein, UA: NEGATIVE
Spec Grav, UA: 1.015
UROBILINOGEN UA: NEGATIVE

## 2016-06-16 MED ORDER — TERBINAFINE HCL 250 MG PO TABS: 250.0000 mg | ORAL_TABLET | Freq: Every day | ORAL | 0 refills | Status: DC

## 2016-06-16 MED ORDER — ATORVASTATIN CALCIUM 10 MG PO TABS: 10.0000 mg | ORAL_TABLET | Freq: Every day | ORAL | 3 refills | Status: DC

## 2016-06-16 MED ORDER — AMLODIPINE-OLMESARTAN 5-20 MG PO TABS: 1.0000 | ORAL_TABLET | Freq: Every day | ORAL | 3 refills | Status: DC

## 2016-06-16 NOTE — Patient Instructions (Signed)
  Mr. Bostic , Thank you for taking time to come for your Medicare Wellness Visit. I appreciate your ongoing commitment to your health goals. Please review the following plan we discussed and let me know if I can assist you in the future.   These are the goals we discussed: Goals    None      This is a list of the screening recommended for you and due dates:  Health Maintenance  Topic Date Due  .  Hepatitis C: One time screening is recommended by Center for Disease Control  (CDC) for  adults born from [birth year] through 1965.   Apr 02, 1946  . Tetanus Vaccine  06/15/2015  . Flu Shot  03/14/2016  . Colon Cancer Screening  05/27/2025  . Shingles Vaccine  Completed  . Pneumonia vaccines  Completed

## 2016-06-16 NOTE — Progress Notes (Signed)
Subjective:  - HPI  Ernest Hughes. is a 70 y.o. male who presents for a complete physicaland an annual wellness visit.  Medical care team includes:  Dr.McKinen Urolog.  Dr.[facility] Preventative care: Last ophthalmology visit:07/14/15 Last dental visit: 10/17 Last colonoscopy:05/28/15 Last prostate exam: 12/2015 Last EKG:04/18/15 Last labs:02/01/15  Prior vaccinations: TD or Tdap:06/23/05 Influenza:06/16/16 Pneumococcal: 01/29/08 05/05/14 Shingles/Zostavax:06/11/06 Advanced directive:yes Concerns: He continues to have difficulty with left shoulder pain. He states the injection lasted roughly 1 month. He notes difficulty with full range of motion of the left arm. He also is noting a numb sensation along the lateral aspect of the leg but states that stretching does help with that. He does have a history of prostate cancer and is being followed by urology for this. He continues on Azor for treatment of his hypertension. He is taking Zantac on a regular basis for treatment of his reflux symptoms. He doesn't complain of thickening of his toenails. This is been going on for several years. He also has had some follicular type lesions present in his beard that is causing some slight difficulty. He continues on atorvastatin for his hyperlipidemia. He has no other concerns or complaints. He is now retired and enjoying his retirement. He does exercise regularly.  Reviewed their medical, surgical, family, social, medication, and allergy history and updated chart as appropriate.    Review of Systems Negative except as above     Objective:   Physical Exam  Vitals:   06/16/16 0931  BP: 132/84  Pulse: 78    General appearance: alert, no distress, WD/WN,  Skin:Several follicular lesions noted in the beard area on his face especially on the right. HEENT: normocephalic, conjunctiva/corneas normal, sclerae anicteric, PERRLA, EOMi, nares patent, no discharge or erythema, pharynx normal Oral  cavity: MMM, tongue normal, teeth normal Neck: supple, no lymphadenopathy, no thyromegaly, no masses, normal ROM Chest: non tender, normal shape and expansion Heart: RRR, normal S1, S2, no murmurs Lungs: CTA bilaterally, no wheezes, rhonchi, or rales Abdomen: +bs, soft, non tender, non distended, no masses, no hepatomegaly, no splenomegaly, no bruits  Musculoskeletal: upper extremities non tender, no obvious deformity, decreased range of motion noted of the left shoulder lower extremities non tender, no obvious deformity, normal ROM throughout Extremities: no edema, no cyanosis, no clubbing and thickening of toenails on both feet his noted. Pulses: 2+ symmetric, upper and lower extremities, normal cap refill Neurological: alert, oriented x 3, CN2-12 intact, strength normal upper extremities and lower extremities, sensation normal throughout, DTRs 2+ throughout, no cerebellar signs, gait normal Psychiatric: normal affect, behavior normal, pleasant   Assessment and Plan :   Routine general medical examination at a health care facility - Plan: POCT Urinalysis Dipstick, CBC with Differential/Platelet, Comprehensive metabolic panel, Lipid panel  Need for prophylactic vaccination and inoculation against influenza - Plan: Flu vaccine HIGH DOSE PF (Fluzone High dose)  Chronic left shoulder pain - Plan: Ambulatory referral to Physical Therapy  Prostate cancer ([place])  HYPERCHOLESTEROLEMIA - Plan: Lipid panel, atorvastatin (LIPITOR) 10 MG tablet  Essential hypertension - Plan: amLODipine-olmesartan (AZOR) 5-20 MG tablet  Gastroesophageal reflux disease without esophagitis  Onychomycosis - Plan: terbinafine (LAMISIL) 250 MG tablet  Need for hepatitis C screening test - Plan: Hepatitis C antibody  Folliculitis barbae Cursed him to continue with his physical activities. I will refer him to physical therapy for rehabilitation his shoulder and also low back. Continue on his previous medications. I  will also give him Lamisil. Discussed the need  to return here in 3 months for recheck and the fact that it is very slow growing. Discussed the folliculitis with him and recommended he continue to treat this conservatively by having a beard. Continue to be followed by urology. Also recommend he use the Zantac for his reflux symptoms on an as-needed basis.  Physical exam - discussed healthy lifestyle, diet, exercise, preventative care, vaccinations, and addressed their concerns.   Follow-up 3 months.

## 2016-06-17 LAB — HEPATITIS C ANTIBODY: HCV AB: NEGATIVE

## 2016-06-17 LAB — LIPID PANEL
CHOLESTEROL: 147 mg/dL (ref 125–200)
HDL: 55 mg/dL (ref >=40)
LDL Cholesterol: 67 mg/dL (ref <130)
TRIGLYCERIDES: 126 mg/dL (ref <150)
Total CHOL/HDL Ratio: 2.7 Ratio (ref <=5.0)
VLDL: 25 mg/dL (ref <30)

## 2016-06-17 LAB — COMPREHENSIVE METABOLIC PANEL
ALK PHOS: 62 U/L (ref 40–115)
ALT: 15 U/L (ref 9–46)
AST: 15 U/L (ref 10–35)
Albumin: 3.9 g/dL (ref 3.6–5.1)
BILIRUBIN TOTAL: 0.5 mg/dL (ref 0.2–1.2)
BUN: 13 mg/dL (ref 7–25)
CO2: 27 mmol/L (ref 20–31)
Calcium: 8.8 mg/dL (ref 8.6–10.3)
Chloride: 104 mmol/L (ref 98–110)
Creat: 1.03 mg/dL (ref 0.70–1.18)
GLUCOSE: 95 mg/dL (ref 65–99)
Potassium: 4.4 mmol/L (ref 3.5–5.3)
SODIUM: 138 mmol/L (ref 135–146)
Total Protein: 7.2 g/dL (ref 6.1–8.1)

## 2016-06-20 ENCOUNTER — Encounter: Payer: Self-pay | Admitting: Internal Medicine

## 2016-06-28 ENCOUNTER — Ambulatory Visit: Payer: Medicare Other | Attending: Family Medicine | Admitting: Physical Therapy

## 2016-06-28 DIAGNOSIS — G8929 Other chronic pain: Secondary | ICD-10-CM | POA: Diagnosis present

## 2016-06-28 DIAGNOSIS — M25612 Stiffness of left shoulder, not elsewhere classified: Secondary | ICD-10-CM

## 2016-06-28 DIAGNOSIS — M25512 Pain in left shoulder: Secondary | ICD-10-CM | POA: Insufficient documentation

## 2016-06-28 NOTE — Therapy (Signed)
[address] [place], [place], [address] Phone: [phone]   Fax:  [phone]  Physical Therapy Evaluation  Patient Details  Name: Ernest Hughes. MRN: [id number] Date of Birth: [birth year] Referring Provider: Dr. Redmond School  Encounter Date: 06/28/2016      PT End of Session - 06/28/16 1103    Visit Number 1   Date for PT Re-Evaluation 08/23/16   PT Start Time [id number]   PT Stop Time 1110   PT Time Calculation (min) 55 min   Activity Tolerance Patient tolerated treatment well;Patient limited by pain   Behavior During Therapy [facility] for tasks assessed/performed      Past Medical History:  Diagnosis Date  . BPH (benign prostatic hyperplasia)   . Cancer ([place])    PROSTATE--treated with radiation  . Diverticulosis   . GERD (gastroesophageal reflux disease)   . Hypertension     Past Surgical History:  Procedure Laterality Date  . BELPHAROPTOSIS REPAIR  12/2013   bilateral  . COLONOSCOPY     Dr. Sharlett Iles    There were no vitals filed for this visit.       Subjective Assessment - 06/28/16 1018    Subjective Patient presents with left shoulder pain. He is unable to sleep on the shoulder without it feeling numb. He reports that the arm is more stiff when it is "inactive".  As the day progresses, the patient is able to move it more however it remains painful and he has to do motions very slowly.    Limitations Lifting;House hold activities   Diagnostic tests not sure if he has recevied imaging on the left shoulder   Patient Stated Goals achieve full ROM, learn proper exercises   Currently in Pain? Yes   Pain Score 3   at worst: 7/10   Pain Location Shoulder   Pain Orientation Left;Anterior;Lateral   Pain Descriptors / Indicators Aching;Sharp   Pain Type Chronic pain   Pain Radiating Towards down to fingers   Pain Onset Other (comment)  "about a year"   Pain Frequency Intermittent   Aggravating  Factors  overhead motions, golf swing (on backswing),    Pain Relieving Factors advil (2 every morning)   Effect of Pain on Daily Activities unable to use left arm with same consistency   Multiple Pain Sites Yes   Pain Score 3  at worst: 7/10   Pain Location Back   Pain Orientation Left            [facility] PT Assessment - 06/28/16 0001      Assessment   Medical Diagnosis left shoulder pain   Referring Provider Dr. Redmond School   Hand Dominance Left   Next MD Visit "90 days"   Prior Therapy no     Precautions   Precautions None     Balance Screen   Has the patient fallen in the past 6 months No     [place] residence   Living Arrangements Spouse/significant other   Type of [place] to enter   Entrance Stairs-Number of Steps 13     Prior Function   Level of [place] Retired   Leisure golf, walking, grandkids, bowling (unable to do)     ROM / Strength   AROM / PROM / Strength AROM;Strength;PROM     AROM   Overall AROM Comments ER/IR bilaterally [facility]  AROM Assessment Site Shoulder   Right/Left Shoulder Right;Left   Right Shoulder Flexion 155 Degrees   Right Shoulder ABduction 145 Degrees   Left Shoulder Flexion 105 Degrees  P!   Left Shoulder ABduction 90 Degrees  p!     PROM   PROM Assessment Site Shoulder   Right/Left Shoulder Left   Left Shoulder Flexion 115 Degrees   Left Shoulder ABduction 105 Degrees     Strength   Strength Assessment Site Shoulder;Elbow   Right/Left Shoulder Right;Left   Right Shoulder Flexion 5/5   Right Shoulder ABduction 5/5   Left Shoulder Flexion 4-/5  P!   Left Shoulder Extension --  P!   Left Shoulder ABduction 4-/5   Left Shoulder Internal Rotation --  P!   Right/Left Elbow Right;Left   Right Elbow Flexion 5/5   Right Elbow Extension 5/5   Left Elbow Flexion 4+/5   Left Elbow Extension 4+/5  Slight p! in posterior arm     Palpation    Palpation comment TTP posterior GH joint     Special Tests    Special Tests Rotator Cuff Impingement   Rotator Cuff Impingment tests Full Can test;Empty Can test;Hawkins- Kennedy test     Hawkins-Kennedy test   Findings Positive   Side Left     Empty Can test   Findings Positive   Side Left     Full Can test   Findings Positive   Side Left                   [facility] Adult PT Treatment/Exercise - 06/28/16 0001      Modalities   Modalities Electrical Stimulation;Moist Heat     Moist Heat Therapy   Number Minutes Moist Heat 15 Minutes   Moist Heat Location Shoulder     Electrical Stimulation   Electrical Stimulation Location Left Shoulder   Electrical Stimulation Goals Pain;Strength                PT Education - 06/28/16 1102    Education provided Yes   Education Details HEP: HS stretch, gastroc stretch, piriformis stetch, TA activation (for low back); towel slide shoulder flexion/abduction   Person(s) Educated Patient   Methods Handout;Explanation;Demonstration   Comprehension Verbalized understanding;Returned demonstration          PT Short Term Goals - 06/28/16 1129      PT SHORT TERM GOAL #1   Title Pt will demonstrate proper recall with HEP.   Time 1   Period Weeks   Status New           PT Long Term Goals - 06/28/16 1129      PT LONG TERM GOAL #1   Title Patient will demonstrate 4+/5 strength in the left shoulder with flexion and abduction in order to show increased strength and be able to perform functional lifting tasks.   Time 8   Period Weeks   Status New     PT LONG TERM GOAL #2   Title Patient will report 50% decrease in worst pain rating.   Time 8   Period Weeks   Status New     PT LONG TERM GOAL #3   Title Patient will be able to perform overhead activities with no increase in symptoms in order to help with household activities more consistently.   Time 8   Period Weeks   Status New               Plan  -  06/28/16 1103    Clinical Impression Statement Patient presents with limited AROM/PROM in left shoulder flexion/abduction. Functionally, patient is still able to perform IR/ER actvities such as shaving head and putting on belt with left arm however he states there is some pain in the shoulder region. Patient has positive Michel Bickers test, empty can test and full can tests which may be indicative of shoulder impingement in the left shoulder.  Patient is also TTP in the posterior [place] joint region around teres minor and posterior deltiod however he reports his pain with movement in the anterolateral portion of the shoulder. Patient strength tests with left shoulder flexion and abduction is limited by pain.   Rehab Potential Good   PT Frequency 2x / week   PT Duration 8 weeks   PT Treatment/Interventions ADLs/Self Care Home Management;Cryotherapy;Electrical Stimulation;Moist Heat;Ultrasound;Functional mobility training;Patient/family education;Therapeutic exercise;Therapeutic activities;Balance training;Manual techniques;Passive range of motion;Taping   PT Next Visit Plan PROM, AROM, resistance exercises, postural correction   PT Home Exercise Plan Low back exercise handout, shoulder stretches   Consulted and Agree with Plan of Care Patient      Patient will benefit from skilled therapeutic intervention in order to improve the following deficits and impairments:  Decreased activity tolerance, Decreased mobility, Decreased range of motion, Decreased strength, Hypomobility, Impaired flexibility, Improper body mechanics, Postural dysfunction, Pain  Visit Diagnosis: Chronic left shoulder pain  Stiffness of left shoulder, not elsewhere classified     Problem List Patient Active Problem List   Diagnosis Date Noted  . Onychomycosis 05/05/2014  . Prostate cancer ([place]) 05/22/2011  . GERD 02/02/2009  . HYPERCHOLESTEROLEMIA 02/01/2009  . Essential hypertension 02/01/2009  . DIVERTICULOSIS, COLON  02/01/2009    Toy Baker, SPT 06/28/2016, 11:39 AM  [address], [place], [address] Phone: [phone]   Fax:  [phone]  Name: Ernest Hughes. MRN: [id number] Date of Birth: [birth year]

## 2016-07-03 ENCOUNTER — Ambulatory Visit: Payer: Medicare Other | Admitting: Physical Therapy

## 2016-07-03 ENCOUNTER — Encounter: Payer: Self-pay | Admitting: Physical Therapy

## 2016-07-03 DIAGNOSIS — M25512 Pain in left shoulder: Secondary | ICD-10-CM | POA: Diagnosis not present

## 2016-07-03 DIAGNOSIS — G8929 Other chronic pain: Secondary | ICD-10-CM

## 2016-07-03 DIAGNOSIS — M25612 Stiffness of left shoulder, not elsewhere classified: Secondary | ICD-10-CM

## 2016-07-03 NOTE — Therapy (Signed)
[address] [place], [place], [address] Phone: [phone]   Fax:  [phone]  Physical Therapy Treatment  Patient Details  Name: Ernest Hughes. MRN: [id number] Date of Birth: Mar 07, 1946 Referring Provider: Dr. Redmond School  Encounter Date: 07/03/2016      PT End of Session - 07/03/16 1602    Visit Number 2   Date for PT Re-Evaluation 08/23/16   PT Start Time 1515   PT Stop Time 1614   PT Time Calculation (min) 59 min   Activity Tolerance Patient tolerated treatment well   Behavior During Therapy [facility] for tasks assessed/performed      Past Medical History:  Diagnosis Date  . BPH (benign prostatic hyperplasia)   . Cancer ([place])    PROSTATE--treated with radiation  . Diverticulosis   . GERD (gastroesophageal reflux disease)   . Hypertension     Past Surgical History:  Procedure Laterality Date  . BELPHAROPTOSIS REPAIR  12/2013   bilateral  . COLONOSCOPY     Dr. Sharlett Iles    There were no vitals filed for this visit.      Subjective Assessment - 07/03/16 1524    Subjective "Im doing pretty good" "The shoulder about the same, was taking tylenol but when I leave Im going to Advil"   Currently in Pain? Yes   Pain Score 5    Pain Location Shoulder   Pain Orientation Left                         [facility] Adult PT Treatment/Exercise - 07/03/16 0001      Exercises   Exercises Shoulder     Shoulder Exercises: Seated   Row 10 reps;Weights  x2,    Row Weight (lbs) 20lb    External Rotation 10 reps;Theraband  x2   Theraband Level (Shoulder External Rotation) Level 1 (Yellow)     Shoulder Exercises: Standing   Flexion 10 reps;Left  up wall with pillow case    ABduction 10 reps  up wall with pillow case    Other Standing Exercises Shoulder flex 2lb 2x10; Shoulder abd 2lb 2x10   Other Standing Exercises CW/CCW x10 each; Wall push ups with ball 2x10     Shoulder Exercises:  ROM/Strengthening   UBE (Upper Arm Bike) L1 x8 min      Modalities   Modalities Electrical Stimulation;Moist Heat     Moist Heat Therapy   Number Minutes Moist Heat 15 Minutes   Moist Heat Location Shoulder     Electrical Stimulation   Electrical Stimulation Location Left Shoulder   Electrical Stimulation Action IFC   Electrical Stimulation Parameters sidelying   Electrical Stimulation Goals Pain                  PT Short Term Goals - 06/28/16 1129      PT SHORT TERM GOAL #1   Title Pt will demonstrate proper recall with HEP.   Time 1   Period Weeks   Status New           PT Long Term Goals - 06/28/16 1129      PT LONG TERM GOAL #1   Title Patient will demonstrate 4+/5 strength in the left shoulder with flexion and abduction in order to show increased strength and be able to perform functional lifting tasks.   Time 8   Period Weeks   Status New  PT LONG TERM GOAL #2   Title Patient will report 50% decrease in worst pain rating.   Time 8   Period Weeks   Status New     PT LONG TERM GOAL #3   Title Patient will be able to perform overhead activities with no increase in symptoms in order to help with household activities more consistently.   Time 8   Period Weeks   Status New               Plan - 07/03/16 1603    Clinical Impression Statement Pt tolerated an initial progression to exercises well. P demos some limitation with L shoulder flexions and abduction in regards to strength at end range. Pt reports most difficulty with abduction.   Rehab Potential Good   PT Frequency 2x / week   PT Duration 8 weeks   PT Treatment/Interventions ADLs/Self Care Home Management;Cryotherapy;Electrical Stimulation;Moist Heat;Ultrasound;Functional mobility training;Patient/family education;Therapeutic exercise;Therapeutic activities;Balance training;Manual techniques;Passive range of motion;Taping   PT Next Visit Plan PROM, AROM, resistance exercises,  postural correction      Patient will benefit from skilled therapeutic intervention in order to improve the following deficits and impairments:  Decreased activity tolerance, Decreased mobility, Decreased range of motion, Decreased strength, Hypomobility, Impaired flexibility, Improper body mechanics, Postural dysfunction, Pain  Visit Diagnosis: Stiffness of left shoulder, not elsewhere classified  Chronic left shoulder pain     Problem List Patient Active Problem List   Diagnosis Date Noted  . Onychomycosis 05/05/2014  . Prostate cancer ([place]) 05/22/2011  . GERD 02/02/2009  . HYPERCHOLESTEROLEMIA 02/01/2009  . Essential hypertension 02/01/2009  . DIVERTICULOSIS, COLON 02/01/2009    [name], PTA 07/03/2016, 4:06 PM  [address] [place], [place], [address] Phone: [phone]   Fax:  [phone]  Name: Ernest Hughes. MRN: [id number] Date of Birth: [birth year]

## 2016-07-05 ENCOUNTER — Encounter: Payer: Self-pay | Admitting: Physical Therapy

## 2016-07-05 ENCOUNTER — Ambulatory Visit: Payer: Medicare Other | Admitting: Physical Therapy

## 2016-07-05 DIAGNOSIS — M25512 Pain in left shoulder: Secondary | ICD-10-CM | POA: Diagnosis not present

## 2016-07-05 DIAGNOSIS — G8929 Other chronic pain: Secondary | ICD-10-CM

## 2016-07-05 DIAGNOSIS — M25612 Stiffness of left shoulder, not elsewhere classified: Secondary | ICD-10-CM

## 2016-07-05 NOTE — Therapy (Signed)
[facility] [place] [address] [place], [place], [address] Phone: [phone]   Fax:  [phone]  Physical Therapy Treatment  Patient Details  Name: Ernest Hughes. MRN: [id number] Date of Birth: 1946/02/22 Referring Provider: Dr. Susann Givens  Encounter Date: 07/05/2016      PT End of Session - 07/05/16 1231    Visit Number 3   Date for PT Re-Evaluation 08/23/16   PT Start Time 1145   PT Stop Time 1230   PT Time Calculation (min) 45 min   Activity Tolerance Patient tolerated treatment well   Behavior During Therapy [facility] for tasks assessed/performed      Past Medical History:  Diagnosis Date  . BPH (benign prostatic hyperplasia)   . Cancer ([facility])    PROSTATE--treated with radiation  . Diverticulosis   . GERD (gastroesophageal reflux disease)   . Hypertension     Past Surgical History:  Procedure Laterality Date  . BELPHAROPTOSIS REPAIR  12/2013   bilateral  . COLONOSCOPY     Dr. Jarold Motto    There were no vitals filed for this visit.      Subjective Assessment - 07/05/16 1154    Subjective "Pretty good"   Currently in Pain? Yes   Pain Score 4    Pain Location Shoulder   Pain Orientation Left                         [facility] Adult PT Treatment/Exercise - 07/05/16 0001      Shoulder Exercises: Seated   Row 10 reps;Weights  x2   Row Weight (lbs) 25   Other Seated Exercises Lat 25lb 2x10     Shoulder Exercises: Standing   Other Standing Exercises 3 way scap stabe ret tband x10 each    Other Standing Exercises half moon on wall with pillow case 2x10;  Wall push ups with ball 2x10     Shoulder Exercises: ROM/Strengthening   UBE (Upper Arm Bike) L4 90frd/3rev     Manual Therapy   Manual Therapy Passive ROM   Passive ROM L shoulder all directions.                   PT Short Term Goals - 06/28/16 1129      PT SHORT TERM GOAL #1   Title Pt will demonstrate proper recall  with HEP.   Time 1   Period Weeks   Status New           PT Long Term Goals - 07/05/16 1238      PT LONG TERM GOAL #1   Title Patient will demonstrate 4+/5 strength in the left shoulder with flexion and abduction in order to show increased strength and be able to perform functional lifting tasks.   Status On-going     PT LONG TERM GOAL #2   Title Patient will report 50% decrease in worst pain rating.   Status Partially Met     PT LONG TERM GOAL #3   Title Patient will be able to perform overhead activities with no increase in symptoms in order to help with household activities more consistently.   Status On-going               Plan - 07/05/16 1231    Clinical Impression Statement Pt reports some soreness after last PT treatment. Pt tolerated all of today's exercises well, he does report some difficulty with half moons against  wall. Pt initially reports pain at end range with MT. As MT progresses pt able to reach full PROM without pain at end range. Pt denied modality post treatment to assess tolerance of exercises.   Rehab Potential Good   PT Frequency 2x / week   PT Duration 8 weeks   PT Treatment/Interventions ADLs/Self Care Home Management;Cryotherapy;Electrical Stimulation;Moist Heat;Ultrasound;Functional mobility training;Patient/family education;Therapeutic exercise;Therapeutic activities;Balance training;Manual techniques;Passive range of motion;Taping   PT Next Visit Plan PROM, AROM, resistance exercises, postural correction      Patient will benefit from skilled therapeutic intervention in order to improve the following deficits and impairments:  Decreased activity tolerance, Decreased mobility, Decreased range of motion, Decreased strength, Hypomobility, Impaired flexibility, Improper body mechanics, Postural dysfunction, Pain  Visit Diagnosis: Stiffness of left shoulder, not elsewhere classified  Chronic left shoulder pain     Problem List Patient  Active Problem List   Diagnosis Date Noted  . Onychomycosis 05/05/2014  . Prostate cancer ([place]) 05/22/2011  . GERD 02/02/2009  . HYPERCHOLESTEROLEMIA 02/01/2009  . Essential hypertension 02/01/2009  . DIVERTICULOSIS, COLON 02/01/2009    [name], PTA 07/05/2016, 12:40 PM  [address] [place], [place], [address] Phone: [phone]   Fax:  [phone]  Name: Ernest Hughes. MRN: [id number] Date of Birth: 1945-12-06

## 2016-07-10 ENCOUNTER — Ambulatory Visit: Payer: Medicare Other | Admitting: Physical Therapy

## 2016-07-10 ENCOUNTER — Encounter: Payer: Self-pay | Admitting: Physical Therapy

## 2016-07-10 DIAGNOSIS — G8929 Other chronic pain: Secondary | ICD-10-CM

## 2016-07-10 DIAGNOSIS — M25512 Pain in left shoulder: Secondary | ICD-10-CM | POA: Diagnosis not present

## 2016-07-10 DIAGNOSIS — M25612 Stiffness of left shoulder, not elsewhere classified: Secondary | ICD-10-CM

## 2016-07-10 NOTE — Therapy (Signed)
[address] [place], [place], [address] Phone: [phone]   Fax:  [phone]  Physical Therapy Treatment  Patient Details  Name: Ernest Hughes. MRN: [id number] Date of Birth: 12-11-1945 Referring Provider: Dr. Redmond School  Encounter Date: 07/10/2016      PT End of Session - 07/10/16 1110    Visit Number 4   Date for PT Re-Evaluation 08/23/16   PT Start Time 3888   PT Stop Time 1103   PT Time Calculation (min) 48 min   Activity Tolerance Patient tolerated treatment well   Behavior During Therapy [facility] for tasks assessed/performed      Past Medical History:  Diagnosis Date  . BPH (benign prostatic hyperplasia)   . Cancer ([place])    PROSTATE--treated with radiation  . Diverticulosis   . GERD (gastroesophageal reflux disease)   . Hypertension     Past Surgical History:  Procedure Laterality Date  . BELPHAROPTOSIS REPAIR  12/2013   bilateral  . COLONOSCOPY     Dr. Sharlett Iles    There were no vitals filed for this visit.      Subjective Assessment - 07/10/16 1018    Subjective "Its been up and down, I didn't some yard work Saturday, yesterday it was a little throbbing some"   Currently in Pain? Yes   Pain Score 4    Pain Location Shoulder   Pain Orientation Left            [facility] PT Assessment - 07/10/16 0001      AROM   Left Shoulder Flexion 147 Degrees   Left Shoulder ABduction 155 Degrees                     [facility] Adult PT Treatment/Exercise - 07/10/16 0001      Shoulder Exercises: Seated   Row Weights;15 reps   Row Weight (lbs) 25     Shoulder Exercises: Standing   Flexion 10 reps;Both;Weights   Shoulder Flexion Weight (lbs) 2   ABduction 10 reps;Weights;Both   Shoulder ABduction Weight (lbs) 2   Other Standing Exercises 3 way scap stabe ret tband x10 each; hlaf moon 2x10   Other Standing Exercises wall pushups with ball 2x15      Shoulder Exercises:  ROM/Strengthening   UBE (Upper Arm Bike) L3.5 80fd/4rev     Modalities   Modalities Ultrasound     Ultrasound   Ultrasound Location Lateral  L shoulder   Ultrasound Parameters 100% 1MHz 1.1w/cm2    Ultrasound Goals Pain                  PT Short Term Goals - 07/10/16 1110      PT SHORT TERM GOAL #1   Title Pt will demonstrate proper recall with HEP.   Status Achieved           PT Long Term Goals - 07/10/16 1110      PT LONG TERM GOAL #1   Title Patient will demonstrate 4+/5 strength in the left shoulder with flexion and abduction in order to show increased strength and be able to perform functional lifting tasks.   Status On-going     PT LONG TERM GOAL #2   Title Patient will report 50% decrease in worst pain rating.   Status Partially Met     PT LONG TERM GOAL #3   Title Patient will be able to perform overhead activities with no increase in  symptoms in order to help with household activities more consistently.   Status Partially Met             Patient will benefit from skilled therapeutic intervention in order to improve the following deficits and impairments:  Decreased activity tolerance, Decreased mobility, Decreased range of motion, Decreased strength, Hypomobility, Impaired flexibility, Improper body mechanics, Postural dysfunction, Pain  Visit Diagnosis: Stiffness of left shoulder, not elsewhere classified  Chronic left shoulder pain     Problem List Patient Active Problem List   Diagnosis Date Noted  . Onychomycosis 05/05/2014  . Prostate cancer ([place]) 05/22/2011  . GERD 02/02/2009  . HYPERCHOLESTEROLEMIA 02/01/2009  . Essential hypertension 02/01/2009  . DIVERTICULOSIS, COLON 02/01/2009    [name], PTA 07/10/2016, 11:14 AM  [place] [address] [place], [place], [address] Phone: [phone]   Fax:  [phone]  Name: Ernest Hughes. MRN:  [id number] Date of Birth: 06-20-1946

## 2016-07-12 ENCOUNTER — Encounter: Payer: Self-pay | Admitting: Physical Therapy

## 2016-07-12 ENCOUNTER — Ambulatory Visit: Payer: Medicare Other | Admitting: Physical Therapy

## 2016-07-12 DIAGNOSIS — M25512 Pain in left shoulder: Secondary | ICD-10-CM

## 2016-07-12 DIAGNOSIS — G8929 Other chronic pain: Secondary | ICD-10-CM

## 2016-07-12 DIAGNOSIS — M25612 Stiffness of left shoulder, not elsewhere classified: Secondary | ICD-10-CM

## 2016-07-12 NOTE — Therapy (Signed)
[facility] [address] [place], [place], [address] Phone: [phone]   Fax:  [phone]  Physical Therapy Treatment  Patient Details  Name: Ernest Hughes. MRN: [id number] Date of Birth: 1946-07-28 Referring Provider: Dr. Susann Givens  Encounter Date: 07/12/2016      PT End of Session - 07/12/16 1106    Visit Number 5   Date for PT Re-Evaluation 08/23/16   PT Start Time 1015   PT Stop Time 1100   PT Time Calculation (min) 45 min   Activity Tolerance Patient tolerated treatment well   Behavior During Therapy [facility] for tasks assessed/performed      Past Medical History:  Diagnosis Date  . BPH (benign prostatic hyperplasia)   . Cancer (HCC)    PROSTATE--treated with radiation  . Diverticulosis   . GERD (gastroesophageal reflux disease)   . Hypertension     Past Surgical History:  Procedure Laterality Date  . BELPHAROPTOSIS REPAIR  12/2013   bilateral  . COLONOSCOPY     Dr. Jarold Motto    There were no vitals filed for this visit.      Subjective Assessment - 07/12/16 1025    Subjective "It was a little bit better after last time"   Currently in Pain? Yes   Pain Score 3    Pain Location Shoulder   Pain Orientation Left                         [facility] Adult PT Treatment/Exercise - 07/12/16 0001      Shoulder Exercises: Standing   External Rotation Left;15 reps;Theraband  x2   Theraband Level (Shoulder External Rotation) Level 2 (Red)   Internal Rotation Left;15 reps;Theraband   Theraband Level (Shoulder Internal Rotation) Level 2 (Red)   Extension 15 reps;Theraband  x2   Theraband Level (Shoulder Extension) Level 3 (Green)   Row [employer] reps  x2   Theraband Level (Shoulder Row) Level 3 (Green)   Other Standing Exercises 3 way scap stabe ret tband x10 each; tricep press downs 20lb 2x15; three level cabinet reaches abd 2lb x10   Other Standing Exercises wall pushups with 2x15,  Bicep curls 20lb 2x15; Flex up wall with pillow case 2x10; abd up wall with pillow case 3x5     Shoulder Exercises: ROM/Strengthening   UBE (Upper Arm Bike) L3.5 53frd/4rev     Ultrasound   Ultrasound Location LAteral L soulder    Ultrasound Parameters 100% 1.2w/cm2   Ultrasound Goals Pain                  PT Short Term Goals - 07/10/16 1110      PT SHORT TERM GOAL #1   Title Pt will demonstrate proper recall with HEP.   Status Achieved           PT Long Term Goals - 07/10/16 1110      PT LONG TERM GOAL #1   Title Patient will demonstrate 4+/5 strength in the left shoulder with flexion and abduction in order to show increased strength and be able to perform functional lifting tasks.   Status On-going     PT LONG TERM GOAL #2   Title Patient will report 50% decrease in worst pain rating.   Status Partially Met     PT LONG TERM GOAL #3   Title Patient will be able to perform overhead activities with no increase in symptoms in order  to help with household activities more consistently.   Status Partially Met               Plan - 07/12/16 1107    Clinical Impression Statement pt tolerated treatment interventions well. does reports some pain with standing shoulder abduction with pillow case at end range. L shoulder fatigues quickly with IR/ER with red tband resistance.    Rehab Potential Good   PT Frequency 2x / week   PT Duration 8 weeks   PT Treatment/Interventions ADLs/Self Care Home Management;Cryotherapy;Electrical Stimulation;Moist Heat;Ultrasound;Functional mobility training;Patient/family education;Therapeutic exercise;Therapeutic activities;Balance training;Manual techniques;Passive range of motion;Taping   PT Next Visit Plan PROM, AROM, resistance exercises, postural correction      Patient will benefit from skilled therapeutic intervention in order to improve the following deficits and impairments:  Decreased activity tolerance, Decreased  mobility, Decreased range of motion, Decreased strength, Hypomobility, Impaired flexibility, Improper body mechanics, Postural dysfunction, Pain  Visit Diagnosis: Stiffness of left shoulder, not elsewhere classified  Chronic left shoulder pain     Problem List Patient Active Problem List   Diagnosis Date Noted  . Onychomycosis 05/05/2014  . Prostate cancer ([place]) 05/22/2011  . GERD 02/02/2009  . HYPERCHOLESTEROLEMIA 02/01/2009  . Essential hypertension 02/01/2009  . DIVERTICULOSIS, COLON 02/01/2009    Willey Blade 07/12/2016, 11:09 AM  [address] [place] [place], [place], [address] Phone: [phone]   Fax:  [phone]  Name: Ernest Hughes. MRN: [id number] Date of Birth: 09/25/1945

## 2016-07-19 ENCOUNTER — Encounter: Payer: Self-pay | Admitting: Physical Therapy

## 2016-07-19 ENCOUNTER — Ambulatory Visit: Payer: Medicare Other | Attending: Family Medicine | Admitting: Physical Therapy

## 2016-07-19 DIAGNOSIS — M25512 Pain in left shoulder: Secondary | ICD-10-CM | POA: Insufficient documentation

## 2016-07-19 DIAGNOSIS — G8929 Other chronic pain: Secondary | ICD-10-CM | POA: Insufficient documentation

## 2016-07-19 DIAGNOSIS — M25612 Stiffness of left shoulder, not elsewhere classified: Secondary | ICD-10-CM | POA: Diagnosis not present

## 2016-07-19 NOTE — Therapy (Signed)
[address] [place], [place], [address] Phone: [phone]   Fax:  [phone]  Physical Therapy Treatment  Patient Details  Name: Ernest Hughes. MRN: [id number] Date of Birth: Jun 23, 1946 Referring Provider: Dr. Redmond School  Encounter Date: 07/19/2016      PT End of Session - 07/19/16 1059    Visit Number 6   Date for PT Re-Evaluation 08/23/16   PT Start Time 1638   PT Stop Time 1100   PT Time Calculation (min) 45 min   Activity Tolerance Patient tolerated treatment well   Behavior During Therapy [facility] for tasks assessed/performed      Past Medical History:  Diagnosis Date  . BPH (benign prostatic hyperplasia)   . Cancer ([place])    PROSTATE--treated with radiation  . Diverticulosis   . GERD (gastroesophageal reflux disease)   . Hypertension     Past Surgical History:  Procedure Laterality Date  . BELPHAROPTOSIS REPAIR  12/2013   bilateral  . COLONOSCOPY     Dr. Sharlett Iles    There were no vitals filed for this visit.      Subjective Assessment - 07/19/16 1021    Subjective "Pretty good, its getting a lot better, still throbbing a little bit, Donnald Garre been doing a lot of yard work"   Currently in Pain? Yes   Pain Score 3    Pain Location Shoulder   Pain Orientation Left                         [facility] Adult PT Treatment/Exercise - 07/19/16 0001      Shoulder Exercises: Seated   Row Weights;15 reps  x2   Row Weight (lbs) 25   Other Seated Exercises Lat 25lb 2x15     Shoulder Exercises: Standing   Other Standing Exercises 4 level cabinet reaches 3lb flex and abd x10 each 3 way scap stab red x10   Other Standing Exercises wall pushups with 2x15, Bicep curls 25lb 2x15;; Tricep ext 25lb 2x15     Shoulder Exercises: ROM/Strengthening   UBE (Upper Arm Bike) Constant work 20 watts 6 min      Modalities   Modalities Ultrasound     Ultrasound   Ultrasound Location Lateral L  shoulder   Ultrasound Parameters 100% 1Mz 1.2w/cm2   Ultrasound Goals Pain                  PT Short Term Goals - 07/10/16 1110      PT SHORT TERM GOAL #1   Title Pt will demonstrate proper recall with HEP.   Status Achieved           PT Long Term Goals - 07/10/16 1110      PT LONG TERM GOAL #1   Title Patient will demonstrate 4+/5 strength in the left shoulder with flexion and abduction in order to show increased strength and be able to perform functional lifting tasks.   Status On-going     PT LONG TERM GOAL #2   Title Patient will report 50% decrease in worst pain rating.   Status Partially Met     PT LONG TERM GOAL #3   Title Patient will be able to perform overhead activities with no increase in symptoms in order to help with household activities more consistently.   Status Partially Met               Plan -  07/19/16 1100    Clinical Impression Statement Pt reports better L shoulder mobility with yard work. Pt reports little pain with the occasional throbbing sensation. Performed all of today's interventions well, does have some difficulty with three way scapular stabilization.   Rehab Potential Good   PT Frequency 2x / week   PT Duration 8 weeks   PT Treatment/Interventions ADLs/Self Care Home Management;Cryotherapy;Electrical Stimulation;Moist Heat;Ultrasound;Functional mobility training;Patient/family education;Therapeutic exercise;Therapeutic activities;Balance training;Manual techniques;Passive range of motion;Taping   PT Next Visit Plan PROM, AROM, resistance exercises, postural correction      Patient will benefit from skilled therapeutic intervention in order to improve the following deficits and impairments:  Decreased activity tolerance, Decreased mobility, Decreased range of motion, Decreased strength, Hypomobility, Impaired flexibility, Improper body mechanics, Postural dysfunction, Pain  Visit Diagnosis: Stiffness of left shoulder, not  elsewhere classified  Chronic left shoulder pain     Problem List Patient Active Problem List   Diagnosis Date Noted  . Onychomycosis 05/05/2014  . Prostate cancer ([place]) 05/22/2011  . GERD 02/02/2009  . HYPERCHOLESTEROLEMIA 02/01/2009  . Essential hypertension 02/01/2009  . DIVERTICULOSIS, COLON 02/01/2009    [name], PTA 07/19/2016, 11:03 AM  [address] [place], [place], [address] Phone: [phone]   Fax:  [phone]  Name: Matteo Banke. MRN: [id number] Date of Birth: 17-Nov-1945

## 2016-07-20 ENCOUNTER — Encounter: Payer: Self-pay | Admitting: Physical Therapy

## 2016-07-20 ENCOUNTER — Ambulatory Visit: Payer: Medicare Other | Admitting: Physical Therapy

## 2016-07-20 DIAGNOSIS — G8929 Other chronic pain: Secondary | ICD-10-CM

## 2016-07-20 DIAGNOSIS — M25512 Pain in left shoulder: Principal | ICD-10-CM

## 2016-07-20 DIAGNOSIS — M25612 Stiffness of left shoulder, not elsewhere classified: Secondary | ICD-10-CM | POA: Diagnosis not present

## 2016-07-20 NOTE — Therapy (Signed)
[address] [place], [place], [address] Phone: [phone]   Fax:  [phone]  Physical Therapy Treatment  Patient Details  Name: Ernest Hughes. MRN: [id number] Date of Birth: May 19, 1946 Referring Provider: Dr. Redmond School  Encounter Date: 07/20/2016      PT End of Session - 07/20/16 1429    Visit Number 7   Date for PT Re-Evaluation 08/23/16   PT Start Time 1345   PT Stop Time 1430   PT Time Calculation (min) 45 min   Activity Tolerance Patient tolerated treatment well   Behavior During Therapy [facility] for tasks assessed/performed      Past Medical History:  Diagnosis Date  . BPH (benign prostatic hyperplasia)   . Cancer ([place])    PROSTATE--treated with radiation  . Diverticulosis   . GERD (gastroesophageal reflux disease)   . Hypertension     Past Surgical History:  Procedure Laterality Date  . BELPHAROPTOSIS REPAIR  12/2013   bilateral  . COLONOSCOPY     Dr. Sharlett Iles    There were no vitals filed for this visit.      Subjective Assessment - 07/20/16 1344    Subjective "Its pretty good, I carried some groceries this morning"   Currently in Pain? Yes   Pain Score 3    Pain Location Shoulder   Pain Orientation Left                         [facility] Adult PT Treatment/Exercise - 07/20/16 0001      Shoulder Exercises: Seated   Row Weights;15 reps  x2   Row Weight (lbs) 25   Other Seated Exercises Lat 25lb 2x15     Shoulder Exercises: Standing   External Rotation Left;15 reps;Theraband  x2   Theraband Level (Shoulder External Rotation) Level 2 (Red)   Internal Rotation Left;15 reps;Theraband  x2   Theraband Level (Shoulder Internal Rotation) Level 3 (Green)   Internal Rotation Weight (lbs)     Other Standing Exercises 4 level cabinet reaches 3lb flex and abd x10 each 3 way scap stab red x10; OHP 3lb 2x10   Other Standing Exercises wall pushups with 2x15, Bicep curls  25lb 2x15;; Tricep ext 25lb 2x15     Shoulder Exercises: ROM/Strengthening   UBE (Upper Arm Bike) L2.5 86fd/3rev     Ultrasound   Ultrasound Location Lateral L shoulder   Ultrasound Parameters 100% 1MHz 1.2w/cm2     Ultrasound Goals Pain                  PT Short Term Goals - 07/10/16 1110      PT SHORT TERM GOAL #1   Title Pt will demonstrate proper recall with HEP.   Status Achieved           PT Long Term Goals - 07/20/16 1432      PT LONG TERM GOAL #1   Title Patient will demonstrate 4+/5 strength in the left shoulder with flexion and abduction in order to show increased strength and be able to perform functional lifting tasks.   Status On-going     PT LONG TERM GOAL #2   Title Patient will report 50% decrease in worst pain rating.   Status Partially Met     PT LONG TERM GOAL #3   Title Patient will be able to perform overhead activities with no increase in symptoms in order to help with household  activities more consistently.   Status Partially Met               Plan - 07/20/16 1430    Clinical Impression Statement Pt continues to report that ADL with L shoulder have become easier. His pain is usually 3/10. Does have some weakness at the end range of L shoulder flexion and abduction.   Rehab Potential Good   PT Frequency 2x / week   PT Duration 8 weeks   PT Treatment/Interventions ADLs/Self Care Home Management;Cryotherapy;Electrical Stimulation;Moist Heat;Ultrasound;Functional mobility training;Patient/family education;Therapeutic exercise;Therapeutic activities;Balance training;Manual techniques;Passive range of motion;Taping   PT Next Visit Plan PROM, AROM, resistance exercises, postural correction      Patient will benefit from skilled therapeutic intervention in order to improve the following deficits and impairments:  Decreased activity tolerance, Decreased mobility, Decreased range of motion, Decreased strength, Hypomobility, Impaired  flexibility, Improper body mechanics, Postural dysfunction, Pain  Visit Diagnosis: Chronic left shoulder pain  Stiffness of left shoulder, not elsewhere classified     Problem List Patient Active Problem List   Diagnosis Date Noted  . Onychomycosis 05/05/2014  . Prostate cancer ([place]) 05/22/2011  . GERD 02/02/2009  . HYPERCHOLESTEROLEMIA 02/01/2009  . Essential hypertension 02/01/2009  . DIVERTICULOSIS, COLON 02/01/2009    [name] 07/20/2016, 2:34 PM  [address] [place], [place], [address] Phone: [phone]   Fax:  [phone]  Name: Ernest Hughes. MRN: [id number] Date of Birth: Feb 28, 1946

## 2016-07-21 ENCOUNTER — Other Ambulatory Visit: Payer: Self-pay | Admitting: Family Medicine

## 2016-07-21 ENCOUNTER — Ambulatory Visit: Payer: Medicare Other | Admitting: Physical Therapy

## 2016-07-25 ENCOUNTER — Encounter: Payer: Self-pay | Admitting: Physical Therapy

## 2016-07-25 ENCOUNTER — Ambulatory Visit: Payer: Medicare Other | Admitting: Physical Therapy

## 2016-07-25 DIAGNOSIS — M25512 Pain in left shoulder: Principal | ICD-10-CM

## 2016-07-25 DIAGNOSIS — M25612 Stiffness of left shoulder, not elsewhere classified: Secondary | ICD-10-CM

## 2016-07-25 DIAGNOSIS — G8929 Other chronic pain: Secondary | ICD-10-CM

## 2016-07-25 NOTE — Therapy (Signed)
[facility] [address] [place], [place], [address] Phone: [phone]   Fax:  [phone]  Physical Therapy Treatment  Patient Details  Name: Ernest J Perella Jr. MRN: [id number] Date of Birth: 09/21/1945 Referring Provider: Dr. Lalonde  Encounter Date: 07/25/2016      PT End of Session - 07/25/16 1142    Visit Number 8   Date for PT Re-Evaluation 08/23/16   PT Start Time 1100   PT Stop Time 1142   PT Time Calculation (min) 42 min   Activity Tolerance Patient tolerated treatment well   Behavior During Therapy [facility] for tasks assessed/performed      Past Medical History:  Diagnosis Date  . BPH (benign prostatic hyperplasia)   . Cancer (HCC)    PROSTATE--treated with radiation  . Diverticulosis   . GERD (gastroesophageal reflux disease)   . Hypertension     Past Surgical History:  Procedure Laterality Date  . BELPHAROPTOSIS REPAIR  12/2013   bilateral  . COLONOSCOPY     Dr. patterson    There were no vitals filed for this visit.      Subjective Assessment - 07/25/16 1107    Subjective Pt states he is doing well with the shoulder howveer he states he has a cold. He reports that some days are better than others and the shoulder can be more stiff at times. This weekend he performed some yard work and feels that the shoulde rmay have become aggravated from that.   Currently in Pain? Yes   Pain Score 5    Pain Location Shoulder   Pain Orientation Left                         [facility] Adult PT Treatment/Exercise - 07/25/16 0001      Therapeutic Activites    Therapeutic Activities Other Therapeutic Activities;ADL's   ADL's reachingin cabinet at different levels, flexion and abduction, 1x10, 2lb weight   Other Therapeutic Activities Golf swing with cane, x10  felt a little tight on backswing     Exercises   Exercises Shoulder;Lumbar     Lumbar Exercises: Aerobic   UBE (Upper Arm Bike) 4  min forward, 4 min backward, lvl 2     Shoulder Exercises: Seated   Row Strengthening;Both;15 reps;Weights   Row Weight (lbs) 25   Other Seated Exercises lat pulldown 2x15, 25 lbs     Shoulder Exercises: Standing   External Rotation Left;Strengthening;10 reps;Weights;Theraband  2 sets   Theraband Level (Shoulder External Rotation) Level 2 (Red)   Internal Rotation Left;Strengthening;10 reps  2 sets   Internal Rotation Weight (lbs) 5 lb, cable cord   Other Standing Exercises pillow slides on wall, lateral, scaption, flexion and adduction 10 x     Manual Therapy   Manual Therapy Joint mobilization;Passive ROM   Joint Mobilization AP jt mobilizaton grade 3 arm in various degrees of abduction, superior to inferior grade 3 jt mobilization with arm 90 degrees abducted   Passive ROM abduction and flexion in supine position                PT Education - 07/25/16 1142    Education provided No          PT Short Term Goals - 07/10/16 1110      PT SHORT TERM GOAL #1   Title Pt will demonstrate proper recall with HEP.   Status Achieved             PT Long Term Goals - 07/20/16 1432      PT LONG TERM GOAL #1   Title Patient will demonstrate 4+/5 strength in the left shoulder with flexion and abduction in order to show increased strength and be able to perform functional lifting tasks.   Status On-going     PT LONG TERM GOAL #2   Title Patient will report 50% decrease in worst pain rating.   Status Partially Met     PT LONG TERM GOAL #3   Title Patient will be able to perform overhead activities with no increase in symptoms in order to help with household activities more consistently.   Status Partially Met               Plan - 07/25/16 1143    Clinical Impression Statement Pt tolerated treatment well but continues to report stiffness when the arm is flexed or abducted above 90 degrees. Pt states that its like a catching sensation and although he can still work  through full AROM, it takes him longer to perfrom. Abduction of the shoulder with cabinet exercise proved very difficult and caused pain. Exercise was discontinued after rep 8. Pt worked on golf swing today and only reported "tightness" on the backswing. Pt was encouraged to begin swinging irons and putting at the driving range in order to transition back into playing golf, a goal he stated during the intial eval. Continue to progress prt pt tolerance and assess the effects of joint mobilizations.    Rehab Potential Good   PT Frequency 2x / week   PT Duration 8 weeks   PT Treatment/Interventions ADLs/Self Care Home Management;Cryotherapy;Electrical Stimulation;Moist Heat;Ultrasound;Functional mobility training;Patient/family education;Therapeutic exercise;Therapeutic activities;Balance training;Manual techniques;Passive range of motion;Taping   PT Next Visit Plan PROM, AROM, resistance exercises, postural correction, assess how jt mobilization felt, practice golf swing, scap stab   Consulted and Agree with Plan of Care Patient      Patient will benefit from skilled therapeutic intervention in order to improve the following deficits and impairments:  Decreased activity tolerance, Decreased mobility, Decreased range of motion, Decreased strength, Hypomobility, Impaired flexibility, Improper body mechanics, Postural dysfunction, Pain  Visit Diagnosis: Chronic left shoulder pain  Stiffness of left shoulder, not elsewhere classified     Problem List Patient Active Problem List   Diagnosis Date Noted  . Onychomycosis 05/05/2014  . Prostate cancer ([facility]) 05/22/2011  . GERD 02/02/2009  . HYPERCHOLESTEROLEMIA 02/01/2009  . Essential hypertension 02/01/2009  . DIVERTICULOSIS, COLON 02/01/2009     Cade , SPT 07/25/2016, 11:53 AM  [facility] [address] [place], [place], [address] Phone: [phone]   Fax:   [phone]  Name: Ernest J Dishon Jr. MRN: [id number] Date of Birth: 07/12/1946    

## 2016-07-27 ENCOUNTER — Ambulatory Visit: Payer: Medicare Other | Admitting: Physical Therapy

## 2016-07-27 ENCOUNTER — Encounter: Payer: Self-pay | Admitting: Physical Therapy

## 2016-07-27 DIAGNOSIS — G8929 Other chronic pain: Secondary | ICD-10-CM

## 2016-07-27 DIAGNOSIS — M25612 Stiffness of left shoulder, not elsewhere classified: Secondary | ICD-10-CM

## 2016-07-27 DIAGNOSIS — M25512 Pain in left shoulder: Secondary | ICD-10-CM

## 2016-07-27 NOTE — Therapy (Signed)
[address] [place], [place], [address] Phone: [phone]   Fax:  [phone]  Physical Therapy Treatment  Patient Details  Name: Ernest Hughes. MRN: [id number] Date of Birth: 1946-08-03 Referring Provider: Dr. Redmond School  Encounter Date: 07/27/2016      PT End of Session - 07/27/16 1059    Visit Number 9   Date for PT Re-Evaluation 08/23/16   PT Start Time 8786   PT Stop Time 1058   PT Time Calculation (min) 43 min   Activity Tolerance Patient tolerated treatment well   Behavior During Therapy [facility] for tasks assessed/performed      Past Medical History:  Diagnosis Date  . BPH (benign prostatic hyperplasia)   . Cancer ([place])    PROSTATE--treated with radiation  . Diverticulosis   . GERD (gastroesophageal reflux disease)   . Hypertension     Past Surgical History:  Procedure Laterality Date  . BELPHAROPTOSIS REPAIR  12/2013   bilateral  . COLONOSCOPY     Dr. Sharlett Iles    There were no vitals filed for this visit.      Subjective Assessment - 07/27/16 1016    Subjective "Ive been coming alone ok"   Currently in Pain? No/denies   Pain Score 0-No pain                         [facility] Adult PT Treatment/Exercise - 07/27/16 0001      Therapeutic Activites    ADL's reachingin cabinet at different levels, flexion and abduction, 1x10, 2lb weight     Lumbar Exercises: Aerobic   UBE (Upper Arm Bike) 4 min forward, 4 min backward, lvl 2     Lumbar Exercises: Machines for Strengthening   Cybex Lumbar Extension L6 37fd/3rev     Shoulder Exercises: Seated   Row Strengthening;15 reps;Weights;Both  x3   Row Weight (lbs) 35   Other Seated Exercises lat pulldown 3x10, 35 lbs   Other Seated Exercises chest press 15lb 3x10; bent over rows 4lb 2x10     Shoulder Exercises: Standing   External Rotation Left;Strengthening;Theraband;15 reps  x2   Theraband Level (Shoulder External  Rotation) Level 3 (Green)   Internal Rotation Left;Strengthening;15 reps;Theraband  x2   Theraband Level (Shoulder Internal Rotation) Level 3 (Green)   Other Standing Exercises LUE D@ flex yellow tband 2x10      Manual Therapy   Manual Therapy Joint mobilization;Passive ROM   Joint Mobilization AP jt mobilizaton grade 3 arm in various degrees of abduction, superior to inferior grade 3 jt mobilization with arm 90 degrees abducted   Passive ROM abduction and flexion in supine position                  PT Short Term Goals - 07/10/16 1110      PT SHORT TERM GOAL #1   Title Pt will demonstrate proper recall with HEP.   Status Achieved           PT Long Term Goals - 07/27/16 1101      PT LONG TERM GOAL #1   Title Patient will demonstrate 4+/5 strength in the left shoulder with flexion and abduction in order to show increased strength and be able to perform functional lifting tasks.   Status Partially Met     PT LONG TERM GOAL #2   Title Patient will report 50% decrease in worst pain rating.   Status  Partially Met     PT LONG TERM GOAL #3   Title Patient will be able to perform overhead activities with no increase in symptoms in order to help with household activities more consistently.   Status Partially Met               Plan - 07/27/16 1059    Clinical Impression Statement Pt able to complete today's interventions. He reports less throbbing with ADLS. Cabinet reaches with abduction give pt the most difficulty. Performed some chest press with light resistance, pt reported some lateral L shoulder pain.     Rehab Potential Good   PT Frequency 2x / week   PT Duration 8 weeks   PT Treatment/Interventions ADLs/Self Care Home Management;Cryotherapy;Electrical Stimulation;Moist Heat;Ultrasound;Functional mobility training;Patient/family education;Therapeutic exercise;Therapeutic activities;Balance training;Manual techniques;Passive range of motion;Taping   PT Next  Visit Plan PROM, AROM, resistance exercises, postural correction, assess how jt mobilization felt, practice golf swing, scap stab      Patient will benefit from skilled therapeutic intervention in order to improve the following deficits and impairments:  Decreased activity tolerance, Decreased mobility, Decreased range of motion, Decreased strength, Hypomobility, Impaired flexibility, Improper body mechanics, Postural dysfunction, Pain  Visit Diagnosis: Stiffness of left shoulder, not elsewhere classified  Chronic left shoulder pain     Problem List Patient Active Problem List   Diagnosis Date Noted  . Onychomycosis 05/05/2014  . Prostate cancer ([place]) 05/22/2011  . GERD 02/02/2009  . HYPERCHOLESTEROLEMIA 02/01/2009  . Essential hypertension 02/01/2009  . DIVERTICULOSIS, COLON 02/01/2009    [name] 07/27/2016, 11:03 AM  [address] [place], [place], [address] Phone: [phone]   Fax:  [phone]  Name: Ernest Hughes. MRN: [id number] Date of Birth: 1945-09-28

## 2016-08-01 ENCOUNTER — Encounter: Payer: Self-pay | Admitting: Physical Therapy

## 2016-08-01 ENCOUNTER — Ambulatory Visit: Payer: Medicare Other | Admitting: Physical Therapy

## 2016-08-01 DIAGNOSIS — M25612 Stiffness of left shoulder, not elsewhere classified: Secondary | ICD-10-CM | POA: Diagnosis not present

## 2016-08-01 DIAGNOSIS — G8929 Other chronic pain: Secondary | ICD-10-CM

## 2016-08-01 DIAGNOSIS — M25512 Pain in left shoulder: Secondary | ICD-10-CM

## 2016-08-01 NOTE — Therapy (Signed)
[address] [place], [place], [address] Phone: [phone]   Fax:  [phone]  Physical Therapy Treatment  Patient Details  Name: Ernest Hughes. MRN: [id number] Date of Birth: 1946-05-03 Referring Provider: Dr. Redmond School  Encounter Date: 08/01/2016      PT End of Session - 08/01/16 1139    Visit Number 10   Date for PT Re-Evaluation 08/23/16   PT Start Time 1055   PT Stop Time 1135   PT Time Calculation (min) 40 min   Activity Tolerance Patient tolerated treatment well   Behavior During Therapy [facility] for tasks assessed/performed      Past Medical History:  Diagnosis Date  . BPH (benign prostatic hyperplasia)   . Cancer ([place])    PROSTATE--treated with radiation  . Diverticulosis   . GERD (gastroesophageal reflux disease)   . Hypertension     Past Surgical History:  Procedure Laterality Date  . BELPHAROPTOSIS REPAIR  12/2013   bilateral  . COLONOSCOPY     Dr. Sharlett Iles    There were no vitals filed for this visit.      Subjective Assessment - 08/01/16 1054    Subjective "Things going pretty good, I don't have that throbbing as much"   Currently in Pain? Yes   Pain Score 3                          [facility] Adult PT Treatment/Exercise - 08/01/16 0001      Shoulder Exercises: Seated   Other Seated Exercises lat pulldown & Rows  3x10, 35 lbs   Other Seated Exercises Bent over flys, rows 5lb 2x10, Seated shoulder flexion 4lb 2x10     Shoulder Exercises: Standing   External Rotation Strengthening;Theraband;15 reps;Both   Theraband Level (Shoulder External Rotation) Level 3 (Green)   Row 10 reps;Weights  x2   Row Weight (lbs) 35   Other Standing Exercises 3 level cabinet reaches 2lnflex and abd LUE x10 each   Other Standing Exercises wall pushups 2x15     Shoulder Exercises: ROM/Strengthening   UBE (Upper Arm Bike) Constant work 15watts x 6 min      Manual Therapy   Manual Therapy Joint mobilization;Passive ROM   Joint Mobilization AP jt mobilizaton grade 3 arm in various degrees of abduction, superior to inferior grade 3 jt mobilization with arm 90 degrees abducted   Passive ROM abduction and flexion in supine position                  PT Short Term Goals - 07/10/16 1110      PT SHORT TERM GOAL #1   Title Pt will demonstrate proper recall with HEP.   Status Achieved           PT Long Term Goals - 07/27/16 1101      PT LONG TERM GOAL #1   Title Patient will demonstrate 4+/5 strength in the left shoulder with flexion and abduction in order to show increased strength and be able to perform functional lifting tasks.   Status Partially Met     PT LONG TERM GOAL #2   Title Patient will report 50% decrease in worst pain rating.   Status Partially Met     PT LONG TERM GOAL #3   Title Patient will be able to perform overhead activities with no increase in symptoms in order to help with household activities more consistently.  Status Partially Met               Plan - 08/01/16 1139    Clinical Impression Statement Pt tolerated today's treatment well and reports an overall improvement at home. Some difficulty with weighted abduction. Pt with some L shoulder elevation with seated shoulder flexion.   Rehab Potential Good   PT Duration 8 weeks   PT Treatment/Interventions ADLs/Self Care Home Management;Cryotherapy;Electrical Stimulation;Moist Heat;Ultrasound;Functional mobility training;Patient/family education;Therapeutic exercise;Therapeutic activities;Balance training;Manual techniques;Passive range of motion;Taping   PT Next Visit Plan PROM, AROM, resistance exercises, postural correction, assess how jt mobilization felt, practice golf swing, scap stab      Patient will benefit from skilled therapeutic intervention in order to improve the following deficits and impairments:  Decreased activity tolerance, Decreased mobility,  Decreased range of motion, Decreased strength, Hypomobility, Impaired flexibility, Improper body mechanics, Postural dysfunction, Pain  Visit Diagnosis: Stiffness of left shoulder, not elsewhere classified  Chronic left shoulder pain     Problem List Patient Active Problem List   Diagnosis Date Noted  . Onychomycosis 05/05/2014  . Prostate cancer ([place]) 05/22/2011  . GERD 02/02/2009  . HYPERCHOLESTEROLEMIA 02/01/2009  . Essential hypertension 02/01/2009  . DIVERTICULOSIS, COLON 02/01/2009    [name] 08/01/2016, 11:41 AM  [address] [place], [place], [address] Phone: [phone]   Fax:  [phone]  Name: Ernest Hughes. MRN: [id number] Date of Birth: Apr 03, 1946

## 2016-08-02 ENCOUNTER — Ambulatory Visit: Payer: Medicare Other | Admitting: Physical Therapy

## 2016-08-02 ENCOUNTER — Encounter: Payer: Self-pay | Admitting: Physical Therapy

## 2016-08-02 DIAGNOSIS — M25612 Stiffness of left shoulder, not elsewhere classified: Secondary | ICD-10-CM | POA: Diagnosis not present

## 2016-08-02 NOTE — Therapy (Signed)
[address] [place], [place], [address] Phone: [phone]   Fax:  [phone]  Physical Therapy Treatment  Patient Details  Name: Ernest Hughes. MRN: [id number] Date of Birth: 06-07-1946 Referring Provider: Dr. Redmond School  Encounter Date: 08/02/2016      PT End of Session - 08/02/16 1144    Visit Number 11   Date for PT Re-Evaluation 08/23/16   PT Start Time 1100   PT Stop Time 1143   PT Time Calculation (min) 43 min   Activity Tolerance Patient tolerated treatment well   Behavior During Therapy [facility] for tasks assessed/performed      Past Medical History:  Diagnosis Date  . BPH (benign prostatic hyperplasia)   . Cancer ([place])    PROSTATE--treated with radiation  . Diverticulosis   . GERD (gastroesophageal reflux disease)   . Hypertension     Past Surgical History:  Procedure Laterality Date  . BELPHAROPTOSIS REPAIR  12/2013   bilateral  . COLONOSCOPY     Dr. Sharlett Iles    There were no vitals filed for this visit.      Subjective Assessment - 08/02/16 1057    Subjective "My arm is alright, I woke up this morning and started moving something, my lower back acting crazy"   Currently in Pain? No/denies   Pain Score 0-No pain                         [facility] Adult PT Treatment/Exercise - 08/02/16 0001      Shoulder Exercises: Seated   Other Seated Exercises lat pulldown & Rows  3x10, 35 lbs     Shoulder Exercises: Standing   External Rotation Strengthening;Theraband;15 reps;Both  x2   Theraband Level (Shoulder External Rotation) Level 3 (Green)   Internal Rotation Left;Strengthening;15 reps;Theraband  x2   Theraband Level (Shoulder Internal Rotation) Level 2 (Red)   Row 10 reps;Weights;Both  x3   Row Weight (lbs) 35   Other Standing Exercises 3 level cabinet reaches 2lb flex and abd LUE x10 each   Other Standing Exercises wall pushups 2x15; tricep ext 35lb 2x15;  bicep curls 35lb 2x15; scap stab red tband x10 each     Shoulder Exercises: ROM/Strengthening   UBE (Upper Arm Bike) Constant work 15watts x 6 min      Manual Therapy   Manual Therapy Joint mobilization;Passive ROM   Joint Mobilization AP jt mobilizaton grade 3 arm in various degrees of abduction, superior to inferior grade 3 jt mobilization with arm 90 degrees abducted   Passive ROM abduction and flexion in supine position                  PT Short Term Goals - 07/10/16 1110      PT SHORT TERM GOAL #1   Title Pt will demonstrate proper recall with HEP.   Status Achieved           PT Long Term Goals - 08/02/16 1144      PT LONG TERM GOAL #1   Title Patient will demonstrate 4+/5 strength in the left shoulder with flexion and abduction in order to show increased strength and be able to perform functional lifting tasks.   Status Partially Met     PT LONG TERM GOAL #2   Title Patient will report 50% decrease in worst pain rating.   Status Achieved     PT LONG TERM GOAL #3  Title Patient will be able to perform overhead activities with no increase in symptoms in order to help with household activities more consistently.   Status Partially Met               Plan - 08/02/16 1145    Clinical Impression Statement Pt continues to progress towards all goals. Pt with full L shoulder PROM and AROM in supine position. Continues to have difficulty with 3 way scap stabe exercise. Pt reports some soreness from yesterdays treatment.   Rehab Potential Good   PT Frequency 2x / week   PT Duration 8 weeks   PT Treatment/Interventions ADLs/Self Care Home Management;Cryotherapy;Electrical Stimulation;Moist Heat;Ultrasound;Functional mobility training;Patient/family education;Therapeutic exercise;Therapeutic activities;Balance training;Manual techniques;Passive range of motion;Taping   PT Next Visit Plan PROM, AROM, resistance exercises, postural correction, assess how jt  mobilization felt, practice golf swing, scap stab, D/C within next two visits.      Patient will benefit from skilled therapeutic intervention in order to improve the following deficits and impairments:  Decreased activity tolerance, Decreased mobility, Decreased range of motion, Decreased strength, Hypomobility, Impaired flexibility, Improper body mechanics, Postural dysfunction, Pain  Visit Diagnosis: Stiffness of left shoulder, not elsewhere classified     Problem List Patient Active Problem List   Diagnosis Date Noted  . Onychomycosis 05/05/2014  . Prostate cancer ([place]) 05/22/2011  . GERD 02/02/2009  . HYPERCHOLESTEROLEMIA 02/01/2009  . Essential hypertension 02/01/2009  . DIVERTICULOSIS, COLON 02/01/2009    [name] 08/02/2016, 11:47 AM  [address] [place], [place], [address] Phone: [phone]   Fax:  [phone]  Name: Ernest Hughes. MRN: [id number] Date of Birth: 1945/11/28

## 2016-08-03 ENCOUNTER — Other Ambulatory Visit: Payer: Self-pay | Admitting: Family Medicine

## 2016-08-10 ENCOUNTER — Encounter: Payer: Self-pay | Admitting: Physical Therapy

## 2016-08-10 ENCOUNTER — Ambulatory Visit: Payer: Medicare Other | Admitting: Physical Therapy

## 2016-08-10 DIAGNOSIS — M25612 Stiffness of left shoulder, not elsewhere classified: Secondary | ICD-10-CM

## 2016-08-10 DIAGNOSIS — M25512 Pain in left shoulder: Secondary | ICD-10-CM

## 2016-08-10 DIAGNOSIS — G8929 Other chronic pain: Secondary | ICD-10-CM

## 2016-08-10 NOTE — Therapy (Signed)
[address] [place], [place], [address] Phone: [phone]   Fax:  [phone]  Physical Therapy Treatment  Patient Details  Name: Ernest Hughes. MRN: [id number] Date of Birth: [birth year] Referring Provider: Dr. Redmond School  Encounter Date: 08/10/2016      PT End of Session - 08/10/16 1055    Visit Number 12   Date for PT Re-Evaluation 08/23/16   PT Start Time 6503   PT Stop Time 1105   PT Time Calculation (min) 50 min   Activity Tolerance Patient tolerated treatment well   Behavior During Therapy [facility] for tasks assessed/performed      Past Medical History:  Diagnosis Date  . BPH (benign prostatic hyperplasia)   . Cancer ([place])    PROSTATE--treated with radiation  . Diverticulosis   . GERD (gastroesophageal reflux disease)   . Hypertension     Past Surgical History:  Procedure Laterality Date  . BELPHAROPTOSIS REPAIR  12/2013   bilateral  . COLONOSCOPY     Dr. Sharlett Iles    There were no vitals filed for this visit.      Subjective Assessment - 08/10/16 1021    Subjective Pt states he is doing better. He feels that PT is helping. There are still moments when he feels pain however they have decreased overall.   Currently in Pain? Yes   Pain Score 3             [facility] PT Assessment - 08/10/16 0001      AROM   Right Shoulder Flexion 165 Degrees  standing   Right Shoulder ABduction 175 Degrees  standing   Left Shoulder Flexion 170 Degrees  standing, pain at end range   Left Shoulder ABduction 160 Degrees  standing, painful at end range     Strength   Left Shoulder Flexion 4-/5  post therex   Left Shoulder ABduction 4-/5  post therex                     [facility] Adult PT Treatment/Exercise - 08/10/16 0001      Lumbar Exercises: Aerobic   UBE (Upper Arm Bike) 2 min fwd, 2 min back     Shoulder Exercises: Seated   Row Strengthening;Both;10 reps;Weights   Row  Weight (lbs) 25   Other Seated Exercises lat pulldown 2x10 25 lb     Shoulder Exercises: Standing   External Rotation Strengthening;Left;10 reps   Extension Strengthening;Both;10 reps;Weights  2 sets   Extension Weight (lbs) 25   Other Standing Exercises washing walls with left shoulder (20xclockwise, 20x counterclockwise), PNF D1/2 patterns with yelow tband, golf swing 2x10  counterclockwis hurts more     Modalities   Modalities Moist Heat     Moist Heat Therapy   Number Minutes Moist Heat 10 Minutes   Moist Heat Location Shoulder                PT Education - 08/10/16 1055    Education provided No          PT Short Term Goals - 07/10/16 1110      PT SHORT TERM GOAL #1   Title Pt will demonstrate proper recall with HEP.   Status Achieved           PT Long Term Goals - 08/10/16 1100      PT LONG TERM GOAL #1   Status Partially Met  Plan - 08/10/16 1055    Clinical Impression Statement Pt toelrated treatment well and was able to complete all exercises. Pt AROM was taken in standing position post therex. He has improved tremendously and demonstrates almost equal range compared to the right shoulder. Strength was assessed today however it was taken after therex was done so pt reports fatigue. Assess strength before therex next treatment session to compare. Pt was able to perform full golf swing 20x without increased symptoms. Progress per pt tolerance.    Rehab Potential Good   PT Frequency 2x / week   PT Duration 8 weeks   PT Treatment/Interventions ADLs/Self Care Home Management;Cryotherapy;Electrical Stimulation;Moist Heat;Ultrasound;Functional mobility training;Patient/family education;Therapeutic exercise;Therapeutic activities;Balance training;Manual techniques;Passive range of motion;Taping   PT Next Visit Plan Assess strength before therapy session, Shoulder ROM, pain management   Consulted and Agree with Plan of Care Patient       Patient will benefit from skilled therapeutic intervention in order to improve the following deficits and impairments:  Decreased activity tolerance, Decreased mobility, Decreased range of motion, Decreased strength, Hypomobility, Impaired flexibility, Improper body mechanics, Postural dysfunction, Pain  Visit Diagnosis: Stiffness of left shoulder, not elsewhere classified  Chronic left shoulder pain     Problem List Patient Active Problem List   Diagnosis Date Noted  . Onychomycosis 05/05/2014  . Prostate cancer ([place]) 05/22/2011  . GERD 02/02/2009  . HYPERCHOLESTEROLEMIA 02/01/2009  . Essential hypertension 02/01/2009  . DIVERTICULOSIS, COLON 02/01/2009    Toy Baker, SPT 08/10/2016, 11:00 AM  [address] [place], [place], [address] Phone: [phone]   Fax:  [phone]  Name: Ernest Hughes. MRN: [id number] Date of Birth: Aug 10, 1946

## 2016-08-15 ENCOUNTER — Ambulatory Visit: Payer: Medicare Other | Attending: Family Medicine | Admitting: Physical Therapy

## 2016-08-15 ENCOUNTER — Encounter: Payer: Self-pay | Admitting: Physical Therapy

## 2016-08-15 DIAGNOSIS — M25512 Pain in left shoulder: Secondary | ICD-10-CM | POA: Insufficient documentation

## 2016-08-15 DIAGNOSIS — M25612 Stiffness of left shoulder, not elsewhere classified: Secondary | ICD-10-CM | POA: Diagnosis present

## 2016-08-15 DIAGNOSIS — G8929 Other chronic pain: Secondary | ICD-10-CM | POA: Insufficient documentation

## 2016-08-15 NOTE — Therapy (Signed)
[address] [place], [place], [address] Phone: [phone]   Fax:  [phone]  Physical Therapy Treatment  Patient Details  Name: Ernest Hughes. MRN: [id number] Date of Birth: 07-22-1946 Referring Provider: Dr. Redmond School  Encounter Date: 08/15/2016      PT End of Session - 08/15/16 1056    Visit Number 13   Date for PT Re-Evaluation 08/23/16   PT Start Time 6283   PT Stop Time 1107   PT Time Calculation (min) 52 min   Activity Tolerance Patient tolerated treatment well   Behavior During Therapy [facility] for tasks assessed/performed      Past Medical History:  Diagnosis Date  . BPH (benign prostatic hyperplasia)   . Cancer ([place])    PROSTATE--treated with radiation  . Diverticulosis   . GERD (gastroesophageal reflux disease)   . Hypertension     Past Surgical History:  Procedure Laterality Date  . BELPHAROPTOSIS REPAIR  12/2013   bilateral  . COLONOSCOPY     Dr. Sharlett Iles    There were no vitals filed for this visit.      Subjective Assessment - 08/15/16 1017    Subjective "Feeling pretty good, don't have that throbbing, but still have that soreness "   Currently in Pain? Yes   Pain Score 3    Pain Location Shoulder   Pain Orientation Left            [facility] PT Assessment - 08/15/16 0001      Strength   Left Shoulder Flexion 4/5  pre tx    Left Shoulder ABduction 4/5  pre tx                     [facility] Adult PT Treatment/Exercise - 08/15/16 0001      Lumbar Exercises: Aerobic   UBE (Upper Arm Bike) L4.5 70fd/3rev      Shoulder Exercises: Seated   Other Seated Exercises lat pulldown & rows  2x15 35 lb   Other Seated Exercises Rows 35lb 3x10     Shoulder Exercises: Standing   External Rotation Strengthening;Left;10 reps;Weights  x3   External Rotation Weight (lbs) 10   Internal Rotation Left;10 reps;Weights  x3   Internal Rotation Weight (lbs) 10   Flexion  Both;Weights;15 reps  x2   Shoulder Flexion Weight (lbs) 3   ABduction 10 reps;Weights;Both  x2   Shoulder ABduction Weight (lbs) 2   Other Standing Exercises 3 level cabinet reaches 3lb flex and abd 2x10; 3 way scap stabe 2x10   Other Standing Exercises flex up wall 3lb 2x15      Modalities   Modalities Moist Heat     Moist Heat Therapy   Number Minutes Moist Heat 10 Minutes   Moist Heat Location Shoulder                  PT Short Term Goals - 07/10/16 1110      PT SHORT TERM GOAL #1   Title Pt will demonstrate proper recall with HEP.   Status Achieved           PT Long Term Goals - 08/10/16 1100      PT LONG TERM GOAL #1   Status Partially Met               Plan - 08/15/16 1057    Clinical Impression Statement Pt with some elevation with L  shoulder active flexion and  abduction. Cues needed to rest more between exercise sets. Does report some pain with 3 level cabinet reaches with flexion and extension.   Rehab Potential Good   PT Frequency 2x / week   PT Duration 8 weeks   PT Treatment/Interventions ADLs/Self Care Home Management;Cryotherapy;Electrical Stimulation;Moist Heat;Ultrasound;Functional mobility training;Patient/family education;Therapeutic exercise;Therapeutic activities;Balance training;Manual techniques;Passive range of motion;Taping   PT Next Visit Plan Shoulder ROM, pain management      Patient will benefit from skilled therapeutic intervention in order to improve the following deficits and impairments:  Decreased activity tolerance, Decreased mobility, Decreased range of motion, Decreased strength, Hypomobility, Impaired flexibility, Improper body mechanics, Postural dysfunction, Pain  Visit Diagnosis: Stiffness of left shoulder, not elsewhere classified  Chronic left shoulder pain     Problem List Patient Active Problem List   Diagnosis Date Noted  . Onychomycosis 05/05/2014  . Prostate cancer ([place]) 05/22/2011  . GERD  02/02/2009  . HYPERCHOLESTEROLEMIA 02/01/2009  . Essential hypertension 02/01/2009  . DIVERTICULOSIS, COLON 02/01/2009    [name], PTA 08/15/2016, 11:00 AM  [address] [place], [place], [address] Phone: [phone]   Fax:  [phone]  Name: Ernest Hughes. MRN: [id number] Date of Birth: 09/02/1945

## 2016-08-22 ENCOUNTER — Ambulatory Visit: Payer: Medicare Other | Admitting: Physical Therapy

## 2016-08-22 ENCOUNTER — Encounter: Payer: Self-pay | Admitting: Physical Therapy

## 2016-08-22 DIAGNOSIS — M25612 Stiffness of left shoulder, not elsewhere classified: Secondary | ICD-10-CM | POA: Diagnosis not present

## 2016-08-22 DIAGNOSIS — M25512 Pain in left shoulder: Secondary | ICD-10-CM

## 2016-08-22 DIAGNOSIS — G8929 Other chronic pain: Secondary | ICD-10-CM

## 2016-08-22 NOTE — Therapy (Signed)
[address] [place], [place], [address] Phone: [phone]   Fax:  [phone]  Physical Therapy Treatment  Patient Details  Name: Geoff Dacanay. MRN: [id number] Date of Birth: [birth year] Referring Provider: Dr. Redmond School  Encounter Date: 08/22/2016      PT End of Session - 08/22/16 1139    Visit Number 16   Date for PT Re-Evaluation 08/23/16   PT Start Time 1100   PT Stop Time 1148   PT Time Calculation (min) 48 min   Activity Tolerance Patient tolerated treatment well   Behavior During Therapy [facility] for tasks assessed/performed      Past Medical History:  Diagnosis Date  . BPH (benign prostatic hyperplasia)   . Cancer ([place])    PROSTATE--treated with radiation  . Diverticulosis   . GERD (gastroesophageal reflux disease)   . Hypertension     Past Surgical History:  Procedure Laterality Date  . BELPHAROPTOSIS REPAIR  12/2013   bilateral  . COLONOSCOPY     Dr. Sharlett Iles    There were no vitals filed for this visit.      Subjective Assessment - 08/22/16 1100    Subjective "Doing pretty good"   Currently in Pain? Yes   Pain Score 3    Pain Location Shoulder   Pain Orientation Left   Pain Descriptors / Indicators Throbbing                         [facility] Adult PT Treatment/Exercise - 08/22/16 0001      Lumbar Exercises: Aerobic   UBE (Upper Arm Bike) L5.0  26fd/3rev      Shoulder Exercises: Seated   Row Strengthening;Both;10 reps;Weights  x3   Row Weight (lbs) 25   Other Seated Exercises lat pulldown & rows  3x15 25 lb; Chest press 15lb 3x10     Shoulder Exercises: Standing   Extension 20 reps;Theraband;Both   Theraband Level (Shoulder Extension) Level 2 (Red)   Row Both;20 reps;Theraband   Theraband Level (Shoulder Row) Level 2 (Red)   Other Standing Exercises 3 level cabinet reaches 3lb flex, 2lb abd x10   Other Standing Exercises shoulder arcs on wall with  pillow case 2x10; mall push ups 2x15      Moist Heat Therapy   Number Minutes Moist Heat 10 Minutes   Moist Heat Location Shoulder                  PT Short Term Goals - 07/10/16 1110      PT SHORT TERM GOAL #1   Title Pt will demonstrate proper recall with HEP.   Status Achieved           PT Long Term Goals - 08/22/16 1139      PT LONG TERM GOAL #1   Title Patient will demonstrate 4+/5 strength in the left shoulder with flexion and abduction in order to show increased strength and be able to perform functional lifting tasks.   Status Partially Met     PT LONG TERM GOAL #2   Title Patient will report 50% decrease in worst pain rating.   Status Achieved     PT LONG TERM GOAL #3   Title Patient will be able to perform overhead activities with no increase in symptoms in order to help with household activities more consistently.   Status Partially Met  Plan - 08/22/16 1139    Clinical Impression Statement Pt with overall improvement, he does reports som difficulty reaching overhead, decrease throbbing but continues to have soreness. Performed all of today's exercises well but reports pain with seated chest press. Again difficulty with 3 level cabinet reaches.    Rehab Potential Good   PT Frequency 2x / week   PT Duration 8 weeks   PT Treatment/Interventions ADLs/Self Care Home Management;Cryotherapy;Electrical Stimulation;Moist Heat;Ultrasound;Functional mobility training;Patient/family education;Therapeutic exercise;Therapeutic activities;Balance training;Manual techniques;Passive range of motion;Taping      Patient will benefit from skilled therapeutic intervention in order to improve the following deficits and impairments:  Decreased activity tolerance, Decreased mobility, Decreased range of motion, Decreased strength, Hypomobility, Impaired flexibility, Improper body mechanics, Postural dysfunction, Pain  Visit Diagnosis: Stiffness of left  shoulder, not elsewhere classified  Chronic left shoulder pain     Problem List Patient Active Problem List   Diagnosis Date Noted  . Onychomycosis 05/05/2014  . Prostate cancer ([place]) 05/22/2011  . GERD 02/02/2009  . HYPERCHOLESTEROLEMIA 02/01/2009  . Essential hypertension 02/01/2009  . DIVERTICULOSIS, COLON 02/01/2009    [name], PTA 08/22/2016, 11:43 AM  [address] [place], [place], [address] Phone: [phone]   Fax:  [phone]  Name: Sante Biedermann. MRN: [id number] Date of Birth: [birth year]

## 2016-08-24 ENCOUNTER — Ambulatory Visit: Payer: Medicare Other | Admitting: Physical Therapy

## 2016-08-24 ENCOUNTER — Encounter: Payer: Self-pay | Admitting: Physical Therapy

## 2016-08-24 DIAGNOSIS — G8929 Other chronic pain: Secondary | ICD-10-CM

## 2016-08-24 DIAGNOSIS — M25612 Stiffness of left shoulder, not elsewhere classified: Secondary | ICD-10-CM

## 2016-08-24 DIAGNOSIS — M25512 Pain in left shoulder: Principal | ICD-10-CM

## 2016-08-24 NOTE — Therapy (Signed)
[address] [place], [place], [address] Phone: [phone]   Fax:  [phone]  Physical Therapy Treatment  Patient Details  Name: Ernest Hughes. MRN: [id number] Date of Birth: 1945/09/16 Referring Provider: Dr. Redmond School  Encounter Date: 08/24/2016      PT End of Session - 08/24/16 1101    Visit Number 17   Date for PT Re-Evaluation 08/23/16   PT Start Time 6834   PT Stop Time 1108   PT Time Calculation (min) 53 min   Activity Tolerance Patient tolerated treatment well   Behavior During Therapy [facility] for tasks assessed/performed      Past Medical History:  Diagnosis Date  . BPH (benign prostatic hyperplasia)   . Cancer ([place])    PROSTATE--treated with radiation  . Diverticulosis   . GERD (gastroesophageal reflux disease)   . Hypertension     Past Surgical History:  Procedure Laterality Date  . BELPHAROPTOSIS REPAIR  12/2013   bilateral  . COLONOSCOPY     Dr. Sharlett Iles    There were no vitals filed for this visit.      Subjective Assessment - 08/24/16 1015    Subjective "Doing pretty good today"   Currently in Pain? No/denies   Pain Score 0-No pain                         [facility] Adult PT Treatment/Exercise - 08/24/16 0001      Lumbar Exercises: Aerobic   UBE (Upper Arm Bike) L5.0  67fd/3rev      Shoulder Exercises: Seated   Row Strengthening;Both;10 reps;Weights  x2, rev grip   Row Weight (lbs) 35   Other Seated Exercises lat pulldown 3x10 35 lb; Chest press 15lb 3x10   Other Seated Exercises Rows 35lb 3x10     Shoulder Exercises: Standing   Flexion Both;Weights;15 reps   Shoulder Flexion Weight (lbs) 3   ABduction 10 reps;Weights;Both   Shoulder ABduction Weight (lbs) 3   Extension 20 reps;Theraband;Both   Theraband Level (Shoulder Extension) Level 2 (Red)   Row Both;20 reps;Theraband   Theraband Level (Shoulder Row) Level 2 (Red)   Other Standing  Exercises 3 level cabinet reaches 2lb flex, 1lb abd x15   Other Standing Exercises shoulder arcs on wall with pillow case 2x10; mall push ups 2x15; Biceps & triceps 35lb 2x10     Modalities   Modalities Moist Heat     Moist Heat Therapy   Number Minutes Moist Heat 10 Minutes   Moist Heat Location Shoulder                  PT Short Term Goals - 07/10/16 1110      PT SHORT TERM GOAL #1   Title Pt will demonstrate proper recall with HEP.   Status Achieved           PT Long Term Goals - 08/24/16 1107      PT LONG TERM GOAL #1   Title Patient will demonstrate 4+/5 strength in the left shoulder with flexion and abduction in order to show increased strength and be able to perform functional lifting tasks.   Status Partially Met     PT LONG TERM GOAL #2   Title Patient will report 50% decrease in worst pain rating.   Status Achieved     PT LONG TERM GOAL #3   Title Patient will be able to perform overhead activities  with no increase in symptoms in order to help with household activities more consistently.   Status Partially Met               Plan - 08/24/16 1101    Clinical Impression Statement Pt tolerated today's treatment well, able to progress with weights will all exercises. Pt does have some L shoulder elevation with active shoulder flex and abduction. Does have some soreness with 3 level shoulder cabinet reaches and arcs.   Rehab Potential Good   PT Frequency 2x / week   PT Duration 8 weeks   PT Treatment/Interventions ADLs/Self Care Home Management;Cryotherapy;Electrical Stimulation;Moist Heat;Ultrasound;Functional mobility training;Patient/family education;Therapeutic exercise;Therapeutic activities;Balance training;Manual techniques;Passive range of motion;Taping   PT Next Visit Plan Shoulder ROM, pain management      Patient will benefit from skilled therapeutic intervention in order to improve the following deficits and impairments:  Decreased  activity tolerance, Decreased mobility, Decreased range of motion, Decreased strength, Hypomobility, Impaired flexibility, Improper body mechanics, Postural dysfunction, Pain  Visit Diagnosis: Chronic left shoulder pain  Stiffness of left shoulder, not elsewhere classified     Problem List Patient Active Problem List   Diagnosis Date Noted  . Onychomycosis 05/05/2014  . Prostate cancer ([place]) 05/22/2011  . GERD 02/02/2009  . HYPERCHOLESTEROLEMIA 02/01/2009  . Essential hypertension 02/01/2009  . DIVERTICULOSIS, COLON 02/01/2009    [name], PTA 08/24/2016, 11:08 AM  [address] [place], [place], [address] Phone: [phone]   Fax:  [phone]  Name: Marquarius Lofton. MRN: [id number] Date of Birth: 1946-03-25

## 2016-08-29 ENCOUNTER — Ambulatory Visit: Payer: Medicare Other | Admitting: Physical Therapy

## 2016-08-29 ENCOUNTER — Encounter: Payer: Self-pay | Admitting: Physical Therapy

## 2016-08-29 DIAGNOSIS — M25612 Stiffness of left shoulder, not elsewhere classified: Secondary | ICD-10-CM | POA: Diagnosis not present

## 2016-08-29 DIAGNOSIS — G8929 Other chronic pain: Secondary | ICD-10-CM

## 2016-08-29 DIAGNOSIS — M25512 Pain in left shoulder: Principal | ICD-10-CM

## 2016-08-29 NOTE — Therapy (Signed)
[address] [place], [place], [address] Phone: [phone]   Fax:  [phone]  Physical Therapy Treatment  Patient Details  Name: Ernest Hughes. MRN: [id number] Date of Birth: [birth year] Referring Provider: Dr. Redmond School  Encounter Date: 08/29/2016      PT End of Session - 08/29/16 1557    Visit Number 18   Date for PT Re-Evaluation 08/23/16   PT Start Time 1515   PT Stop Time 1605   PT Time Calculation (min) 50 min   Activity Tolerance Patient tolerated treatment well   Behavior During Therapy [facility] for tasks assessed/performed      Past Medical History:  Diagnosis Date  . BPH (benign prostatic hyperplasia)   . Cancer ([place])    PROSTATE--treated with radiation  . Diverticulosis   . GERD (gastroesophageal reflux disease)   . Hypertension     Past Surgical History:  Procedure Laterality Date  . BELPHAROPTOSIS REPAIR  12/2013   bilateral  . COLONOSCOPY     Dr. Sharlett Iles    There were no vitals filed for this visit.      Subjective Assessment - 08/29/16 1515    Subjective "Im feeling ok, not as much throbbing"   Currently in Pain? Yes   Pain Score 2    Pain Location Shoulder   Pain Orientation Left                         [facility] Adult PT Treatment/Exercise - 08/29/16 0001      Lumbar Exercises: Aerobic   UBE (Upper Arm Bike) L5.0  42fd/3rev      Lumbar Exercises: Standing   Other Standing Lumbar Exercises wall push ups 2x10     Shoulder Exercises: Seated   Other Seated Exercises lat pulldown 3x10 35 lb; Chest press 15lb 2x15; bent over rows 3lb 2x15; bent over flys 3lb 2x10; seated front raises 3lb 2x10    Other Seated Exercises Rows 35lb 2x15     Shoulder Exercises: Standing   External Rotation Strengthening;Left;10 reps;Weights;15 reps  x2   External Rotation Weight (lbs) 5   Internal Rotation Left;Weights;15 reps  x2   Internal Rotation Weight (lbs) 5   Flexion Both;Weights;15 reps   Shoulder Flexion Weight (lbs) 3   ABduction 10 reps;Weights;Both   Shoulder ABduction Weight (lbs) 3   Other Standing Exercises 3 level cabinet reaches 2lb flex, 2lb abd x15   Other Standing Exercises shoulder arcs on wall with pillow case 2x10     Modalities   Modalities Moist Heat     Moist Heat Therapy   Number Minutes Moist Heat 10 Minutes   Moist Heat Location Shoulder                  PT Short Term Goals - 07/10/16 1110      PT SHORT TERM GOAL #1   Title Pt will demonstrate proper recall with HEP.   Status Achieved           PT Long Term Goals - 08/24/16 1107      PT LONG TERM GOAL #1   Title Patient will demonstrate 4+/5 strength in the left shoulder with flexion and abduction in order to show increased strength and be able to perform functional lifting tasks.   Status Partially Met     PT LONG TERM GOAL #2   Title Patient will report 50% decrease in worst pain rating.  Status Achieved     PT LONG TERM GOAL #3   Title Patient will be able to perform overhead activities with no increase in symptoms in order to help with household activities more consistently.   Status Partially Met               Plan - 08/29/16 1557    Clinical Impression Statement Pt able to complete all of today's exercises well. He continues to reports pain at end range of flexion and extension.   Rehab Potential Good   PT Frequency 2x / week   PT Treatment/Interventions ADLs/Self Care Home Management;Cryotherapy;Electrical Stimulation;Moist Heat;Ultrasound;Functional mobility training;Patient/family education;Therapeutic exercise;Therapeutic activities;Balance training;Manual techniques;Passive range of motion;Taping   PT Next Visit Plan Shoulder ROM, pain management      Patient will benefit from skilled therapeutic intervention in order to improve the following deficits and impairments:  Decreased activity tolerance, Decreased mobility,  Decreased range of motion, Decreased strength, Hypomobility, Impaired flexibility, Improper body mechanics, Postural dysfunction, Pain  Visit Diagnosis: Chronic left shoulder pain  Stiffness of left shoulder, not elsewhere classified     Problem List Patient Active Problem List   Diagnosis Date Noted  . Onychomycosis 05/05/2014  . Prostate cancer ([place]) 05/22/2011  . GERD 02/02/2009  . HYPERCHOLESTEROLEMIA 02/01/2009  . Essential hypertension 02/01/2009  . DIVERTICULOSIS, COLON 02/01/2009    [name], PTA 08/29/2016, 3:59 PM  [address] [place], [place], [address] Phone: [phone]   Fax:  [phone]  Name: Ernest Hughes. MRN: [id number] Date of Birth: 16-Mar-1946

## 2016-08-31 ENCOUNTER — Ambulatory Visit: Payer: Medicare Other | Admitting: Physical Therapy

## 2016-09-04 ENCOUNTER — Telehealth: Payer: Self-pay | Admitting: Family Medicine

## 2016-09-04 DIAGNOSIS — I10 Essential (primary) hypertension: Secondary | ICD-10-CM

## 2016-09-04 MED ORDER — AMLODIPINE-OLMESARTAN 5-20 MG PO TABS: 1.0000 | ORAL_TABLET | Freq: Every day | ORAL | 3 refills | Status: DC

## 2016-09-04 NOTE — Telephone Encounter (Signed)
Pt got letter from insurance company that Orrick now going to cost Tier 3, and there is new generic available.  I called pharmacy & verified that there is new generic Amlodipine/Olmesartan and will be Tier 1 for our pt.  Pt wants to switch.  Please send in generic Azor  to [facility].  Mickel Baas)

## 2016-09-05 ENCOUNTER — Encounter: Payer: Self-pay | Admitting: Physical Therapy

## 2016-09-05 ENCOUNTER — Ambulatory Visit: Payer: Medicare Other | Admitting: Physical Therapy

## 2016-09-05 DIAGNOSIS — G8929 Other chronic pain: Secondary | ICD-10-CM

## 2016-09-05 DIAGNOSIS — M25612 Stiffness of left shoulder, not elsewhere classified: Secondary | ICD-10-CM | POA: Diagnosis not present

## 2016-09-05 DIAGNOSIS — M25512 Pain in left shoulder: Principal | ICD-10-CM

## 2016-09-05 NOTE — Therapy (Signed)
[address] [place], [place], [address] Phone: [phone]   Fax:  [phone]  Physical Therapy Treatment  Patient Details  Name: Ernest Hughes. MRN: [id number] Date of Birth: [birth year] Referring Provider: Dr. Redmond School  Encounter Date: 09/05/2016      PT End of Session - 09/05/16 1009    Visit Number 19   Date for PT Re-Evaluation 08/23/16   PT Start Time 0930   PT Stop Time 1012   PT Time Calculation (min) 42 min   Activity Tolerance Patient tolerated treatment well   Behavior During Therapy [facility] for tasks assessed/performed      Past Medical History:  Diagnosis Date  . BPH (benign prostatic hyperplasia)   . Cancer ([place])    PROSTATE--treated with radiation  . Diverticulosis   . GERD (gastroesophageal reflux disease)   . Hypertension     Past Surgical History:  Procedure Laterality Date  . BELPHAROPTOSIS REPAIR  12/2013   bilateral  . COLONOSCOPY     Dr. Sharlett Iles    There were no vitals filed for this visit.      Subjective Assessment - 09/05/16 0933    Subjective "I been having some problems with it aching"   Currently in Pain? No/denies   Pain Score 0-No pain                         [facility] Adult PT Treatment/Exercise - 09/05/16 0001      Lumbar Exercises: Aerobic   UBE (Upper Arm Bike) L.0  5fd/3rev      Shoulder Exercises: Standing   External Rotation Strengthening;Left;10 reps;Theraband  x2   Theraband Level (Shoulder External Rotation) Level 2 (Red)   Internal Rotation --  x2   Theraband Level (Shoulder Internal Rotation) Level 2 (Red)   Flexion Both;Weights;10 reps  x2   Shoulder Flexion Weight (lbs) 2   ABduction 10 reps;Weights;Both   Shoulder ABduction Weight (lbs) 2   Extension 20 reps;Theraband;Both   Theraband Level (Shoulder Extension) Level 2 (Red)   Row Both;20 reps;Theraband   Theraband Level (Shoulder Row) Level 2 (Red)   Other  Standing Exercises shruggs 8lb 2x10     Modalities   Modalities Ultrasound     Ultrasound   Ultrasound Location Lateral L shoulder   Ultrasound Parameters 1MHz 1.2w/cm2   Ultrasound Goals Pain;Other (Comment)  healing                  PT Short Term Goals - 07/10/16 1110      PT SHORT TERM GOAL #1   Title Pt will demonstrate proper recall with HEP.   Status Achieved           PT Long Term Goals - 08/24/16 1107      PT LONG TERM GOAL #1   Title Patient will demonstrate 4+/5 strength in the left shoulder with flexion and abduction in order to show increased strength and be able to perform functional lifting tasks.   Status Partially Met     PT LONG TERM GOAL #2   Title Patient will report 50% decrease in worst pain rating.   Status Achieved     PT LONG TERM GOAL #3   Title Patient will be able to perform overhead activities with no increase in symptoms in order to help with household activities more consistently.   Status Partially Met  Plan - 09/05/16 1010    Clinical Impression Statement Pt today reports that he has increase throbbing in his L shoulder. Pt tolerated all of today's exercises well. Pt does have some L shoulder elevation with active shoulder flexion and abduction. Pt has been treated with exercises, modalities, and MT but still reports soreness at end ranges of L shoulder flexion and abduction.   Rehab Potential Good   PT Frequency 2x / week   PT Duration 8 weeks   PT Treatment/Interventions ADLs/Self Care Home Management;Cryotherapy;Electrical Stimulation;Moist Heat;Ultrasound;Functional mobility training;Patient/family education;Therapeutic exercise;Therapeutic activities;Balance training;Manual techniques;Passive range of motion;Taping   PT Next Visit Plan Will place Pt on 2 week hold, he will call and schedule more appoints if he notices a decline in function.      Patient will benefit from skilled therapeutic  intervention in order to improve the following deficits and impairments:  Decreased activity tolerance, Decreased mobility, Decreased range of motion, Decreased strength, Hypomobility, Impaired flexibility, Improper body mechanics, Postural dysfunction, Pain  Visit Diagnosis: Chronic left shoulder pain  Stiffness of left shoulder, not elsewhere classified     Problem List Patient Active Problem List   Diagnosis Date Noted  . Onychomycosis 05/05/2014  . Prostate cancer ([place]) 05/22/2011  . GERD 02/02/2009  . HYPERCHOLESTEROLEMIA 02/01/2009  . Essential hypertension 02/01/2009  . DIVERTICULOSIS, COLON 02/01/2009    [name], PTA 09/05/2016, 10:14 AM  [address] [place], [place], [address] Phone: [phone]   Fax:  [phone]  Name: Ernest Hughes. MRN: [id number] Date of Birth: Jul 05, 1946

## 2016-09-20 ENCOUNTER — Encounter: Payer: Self-pay | Admitting: Family Medicine

## 2016-09-20 ENCOUNTER — Ambulatory Visit
Admission: RE | Admit: 2016-09-20 | Discharge: 2016-09-20 | Disposition: A | Discharge status: Home / Self Care | Payer: Medicare Other | Source: Ambulatory Visit | Attending: Family Medicine | Admitting: Family Medicine

## 2016-09-20 ENCOUNTER — Ambulatory Visit (INDEPENDENT_AMBULATORY_CARE_PROVIDER_SITE_OTHER): Payer: Medicare Other | Admitting: Family Medicine

## 2016-09-20 VITALS — BP 116/80 | HR 77 | Wt 195.0 lb

## 2016-09-20 DIAGNOSIS — M25512 Pain in left shoulder: Secondary | ICD-10-CM

## 2016-09-20 DIAGNOSIS — L989 Disorder of the skin and subcutaneous tissue, unspecified: Secondary | ICD-10-CM

## 2016-09-20 DIAGNOSIS — Z79899 Other long term (current) drug therapy: Secondary | ICD-10-CM | POA: Diagnosis not present

## 2016-09-20 DIAGNOSIS — G8929 Other chronic pain: Secondary | ICD-10-CM | POA: Diagnosis not present

## 2016-09-20 DIAGNOSIS — B351 Tinea unguium: Secondary | ICD-10-CM | POA: Diagnosis not present

## 2016-09-20 LAB — CBC WITH DIFFERENTIAL/PLATELET
BASOS ABS: 38 {cells}/uL (ref 0–200)
Basophils Relative: 1 %
EOS PCT: 3 %
Eosinophils Absolute: 114 cells/uL (ref 15–500)
HCT: 42.2 % (ref 38.5–50.0)
HEMOGLOBIN: 13.7 g/dL (ref 13.2–17.1)
LYMPHS ABS: 1596 {cells}/uL (ref 850–3900)
Lymphocytes Relative: 42 %
MCH: 28.2 pg (ref 27.0–33.0)
MCHC: 32.5 g/dL (ref 32.0–36.0)
MCV: 87 fL (ref 80.0–100.0)
MONOS PCT: 9 %
MPV: 10.3 fL (ref 7.5–12.5)
Monocytes Absolute: 342 cells/uL (ref 200–950)
NEUTROS ABS: 1710 {cells}/uL (ref 1500–7800)
NEUTROS PCT: 45 %
PLATELETS: 183 10*3/uL (ref 140–400)
RBC: 4.85 MIL/uL (ref 4.20–5.80)
RDW: 15 % (ref 11.0–15.0)
WBC: 3.8 10*3/uL — AB (ref 4.0–10.5)

## 2016-09-20 LAB — COMPREHENSIVE METABOLIC PANEL
ALBUMIN: 3.7 g/dL (ref 3.6–5.1)
ALT: 15 U/L (ref 9–46)
AST: 14 U/L (ref 10–35)
Alkaline Phosphatase: 57 U/L (ref 40–115)
BILIRUBIN TOTAL: 0.4 mg/dL (ref 0.2–1.2)
BUN: 15 mg/dL (ref 7–25)
CHLORIDE: 105 mmol/L (ref 98–110)
CO2: 27 mmol/L (ref 20–31)
CREATININE: 1.06 mg/dL (ref 0.70–1.18)
Calcium: 8.9 mg/dL (ref 8.6–10.3)
GLUCOSE: 85 mg/dL (ref 65–99)
Potassium: 4.1 mmol/L (ref 3.5–5.3)
SODIUM: 140 mmol/L (ref 135–146)
Total Protein: 6.9 g/dL (ref 6.1–8.1)

## 2016-09-20 IMAGING — CR DG SHOULDER 2+V*L*
3 series · 3 of 3 positions shown · non-contrast
Comparison: None.

CLINICAL DATA: Chronic left shoulder pain.  No known injury.

EXAM:
LEFT SHOULDER - 2+ VIEW

[w shoulder grashey left]
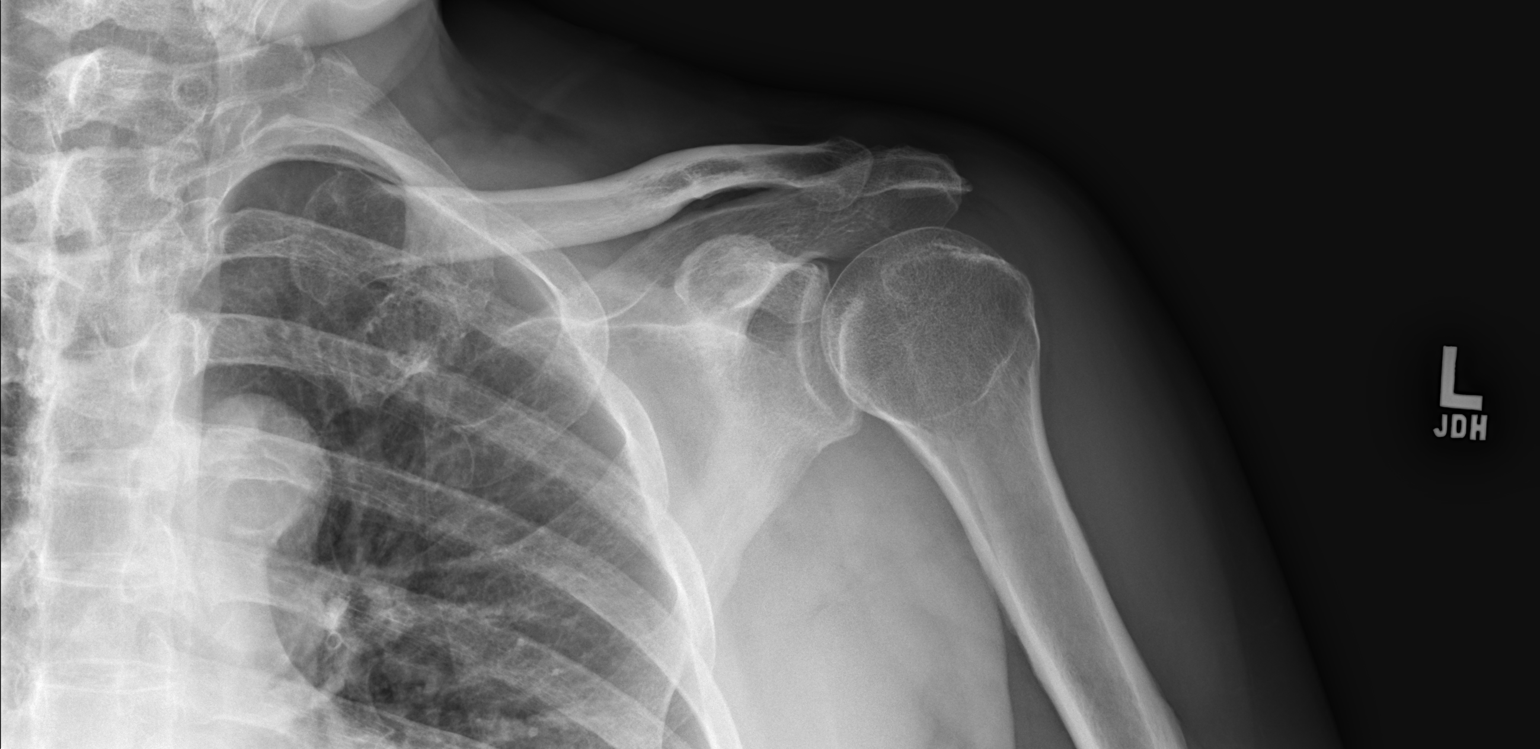

[w shoulder y-view left]
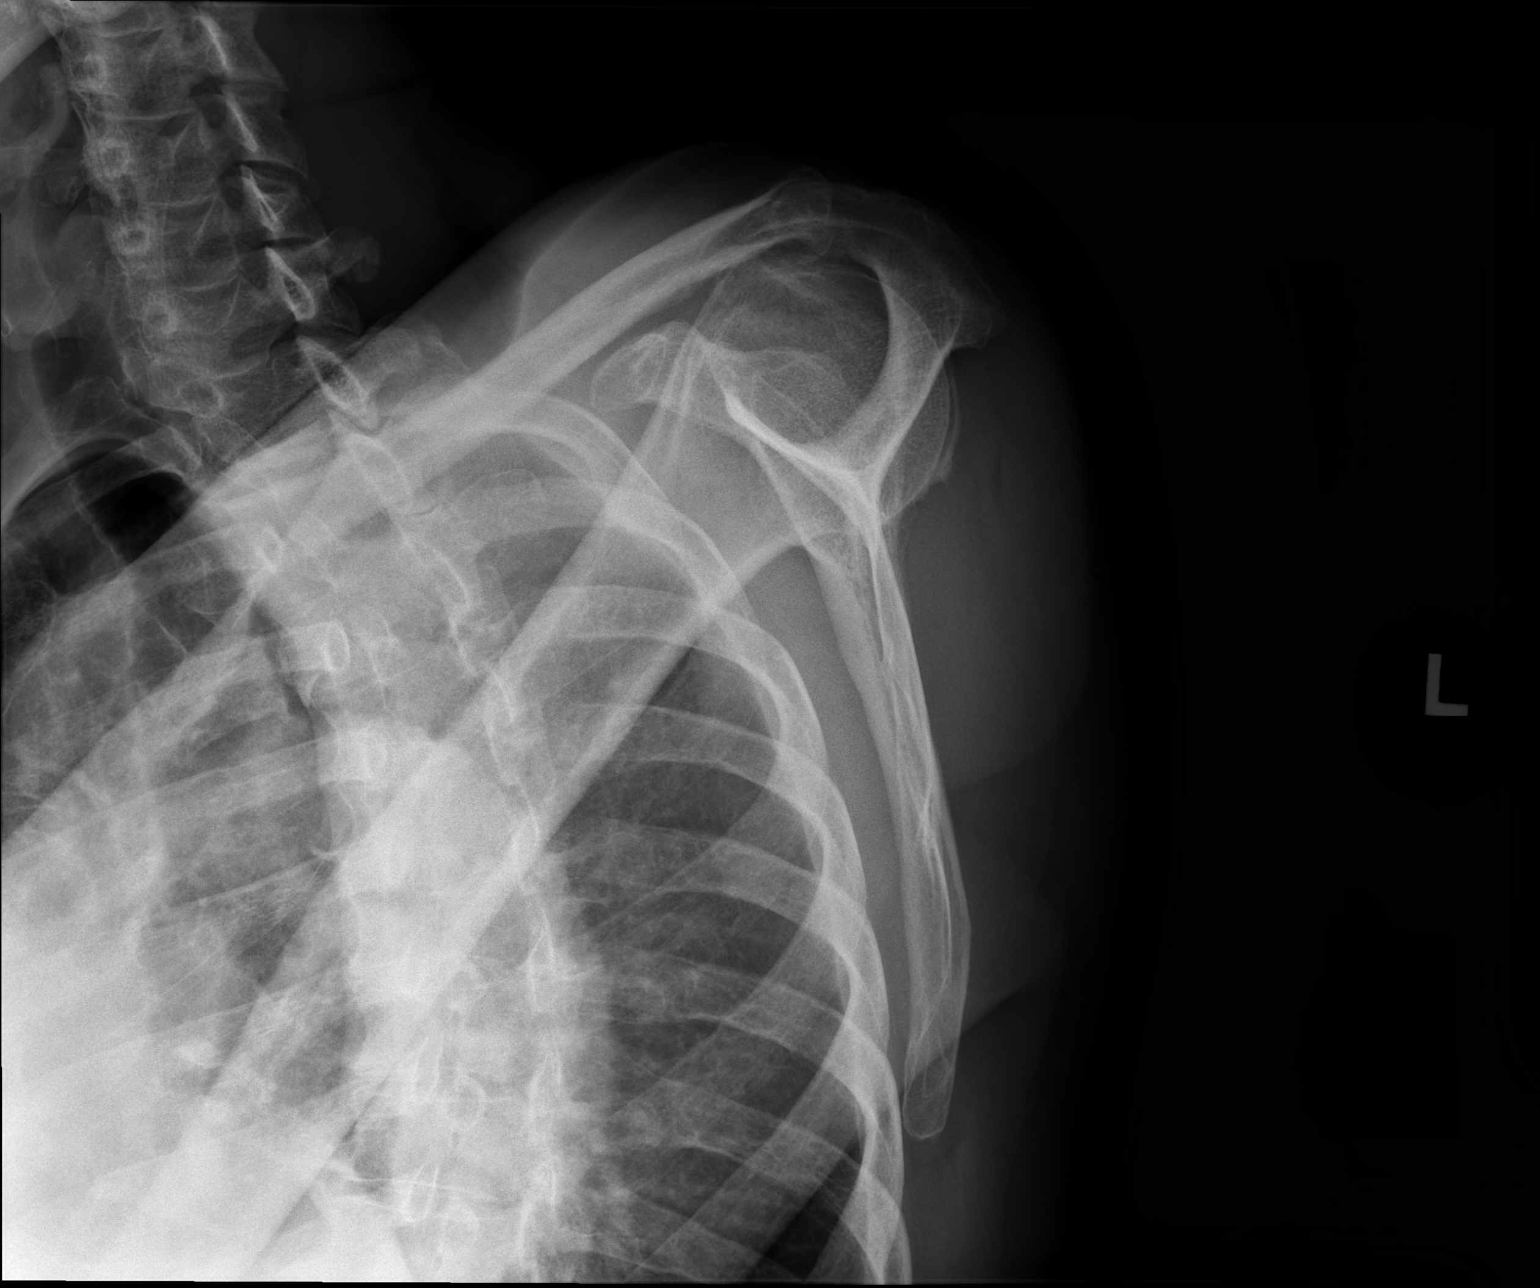

[w shoulder axillary left]
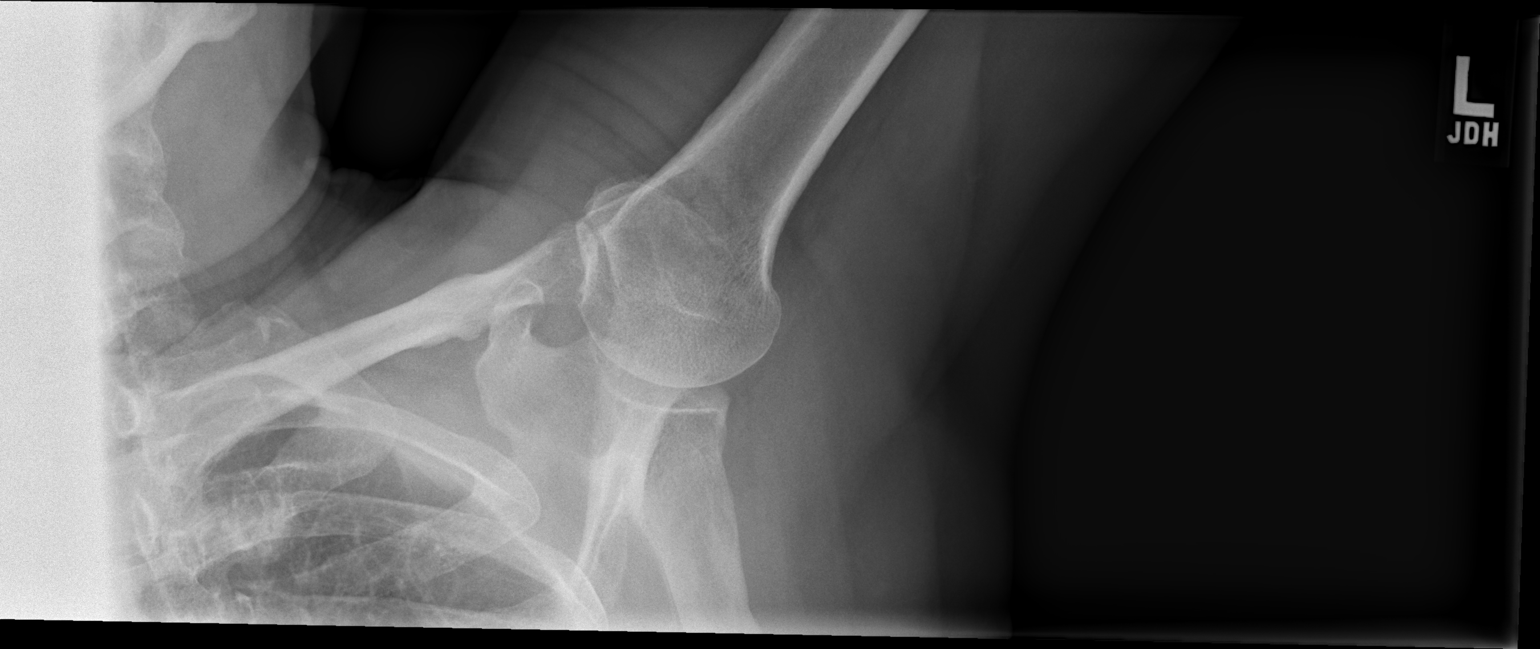

[3 of 3 positions shown; findings below may reference images not displayed]

FINDINGS: Mild spurring at the left AC joint. Glenohumeral joint is
maintained. No acute bony abnormality. Specifically, no fracture,
subluxation, or dislocation. Soft tissues are intact.
IMPRESSION: Mild degenerative changes at the left AC joint. No acute bony
abnormality.

## 2016-09-20 MED ORDER — TERBINAFINE HCL 250 MG PO TABS: 250.0000 mg | ORAL_TABLET | Freq: Every day | ORAL | 1 refills | Status: DC

## 2016-09-20 NOTE — Progress Notes (Signed)
   Subjective:    Patient ID: Ernest Ora., male    DOB: 1946-07-31, 71 y.o.   MRN: [id number]  HPI He is here for recheck on treating his onychomycosis. He has been on Lamisil but had no difficulty with that. He does see some slight improvement in his toenails. He continues have left shoulder pain however physical therapy has not help as much as he would like. He is now having more nighttime pain. He also has a lesion present on the right cheek that he states has been intermittent in nature.   Review of Systems     Objective:   Physical Exam Alert and in no distress. Left shoulder was not examined however he does demonstrate fairly good range of motion. Exam of his face does show a round slightly raised area approximately 3 cm in size on the right cheek near the lip. It is not erythematous. Exam of the toenails does show some slight improvement in nail growth at the base.       Assessment & Plan:  Chronic left shoulder pain - Plan: DG Shoulder Left  Onychomycosis - Plan: CBC with Differential/Platelet, Comprehensive metabolic panel, terbinafine (LAMISIL) 250 MG tablet  Encounter for long-term (current) use of medications - Plan: CBC with Differential/Platelet, Comprehensive metabolic panel Will do the x-ray and if nothing significant shows up, continue physical therapy but did recommend a recheck and possible ultrasound if continued difficulty. He will continue on his Lamisil. He plans to follow-up with dermatology concerning the facial lesion as I'm not sure what exactly this is.

## 2016-12-14 ENCOUNTER — Encounter: Payer: Self-pay | Admitting: Family Medicine

## 2016-12-14 ENCOUNTER — Ambulatory Visit (INDEPENDENT_AMBULATORY_CARE_PROVIDER_SITE_OTHER): Payer: Medicare Other | Admitting: Family Medicine

## 2016-12-14 ENCOUNTER — Ambulatory Visit
Admission: RE | Admit: 2016-12-14 | Discharge: 2016-12-14 | Disposition: A | Discharge status: Home / Self Care | Payer: Medicare Other | Source: Ambulatory Visit | Attending: Family Medicine | Admitting: Family Medicine

## 2016-12-14 VITALS — BP 120/70 | HR 74 | Wt 193.0 lb

## 2016-12-14 DIAGNOSIS — C61 Malignant neoplasm of prostate: Secondary | ICD-10-CM

## 2016-12-14 DIAGNOSIS — M25552 Pain in left hip: Secondary | ICD-10-CM | POA: Diagnosis not present

## 2016-12-14 IMAGING — CR DG LUMBAR SPINE COMPLETE 4+V
5 series · 5 of 5 positions shown · non-contrast
Comparison: [DATE].

CLINICAL DATA: Low back pain.

EXAM:
LUMBAR SPINE - COMPLETE 4+ VIEW

[w lumbar spine ap]
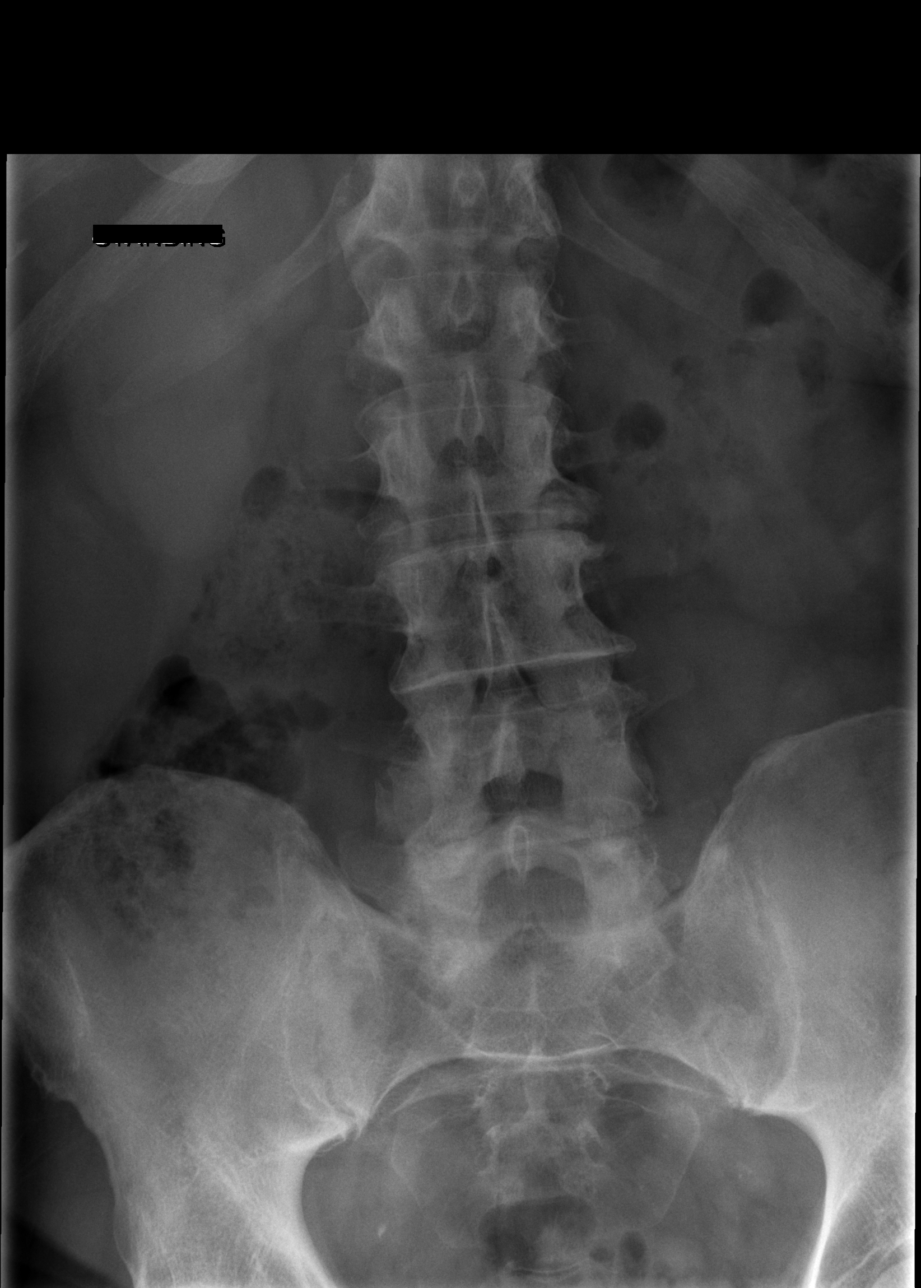

[w lumbar spine obl (1 of 2)]
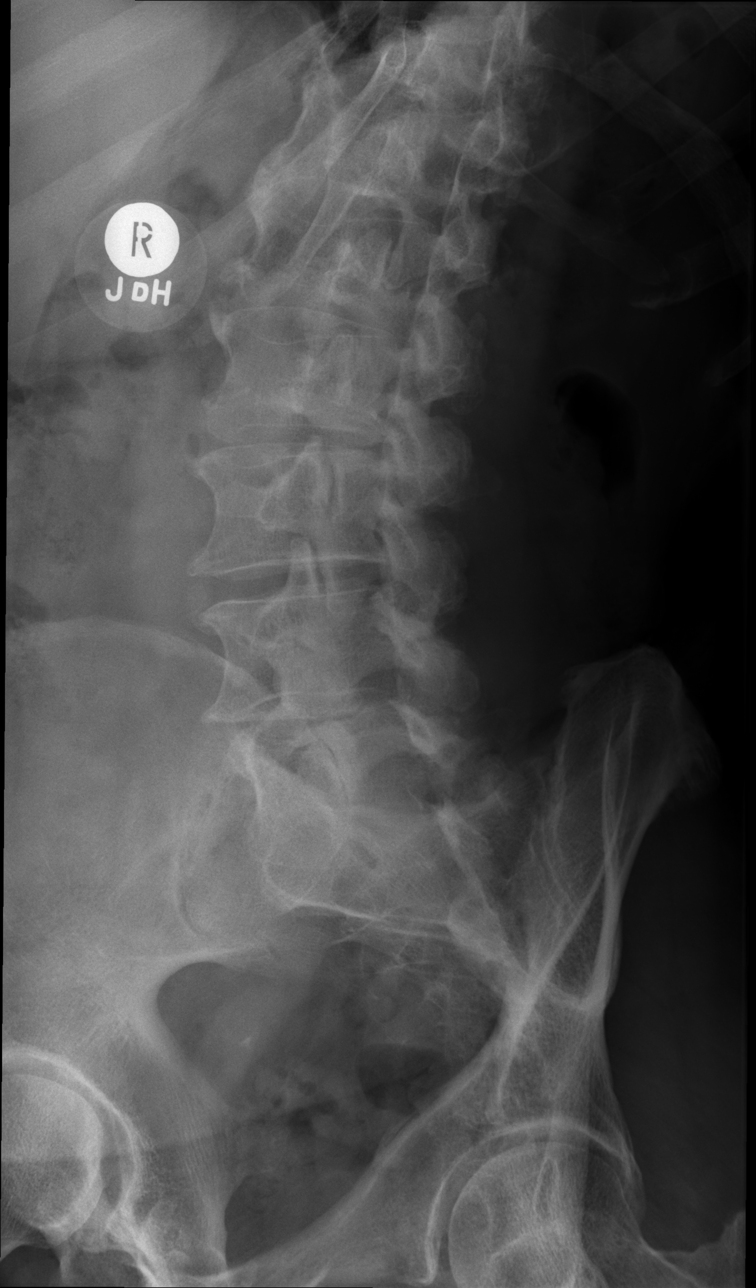

[w lumbar spine obl (2 of 2)]
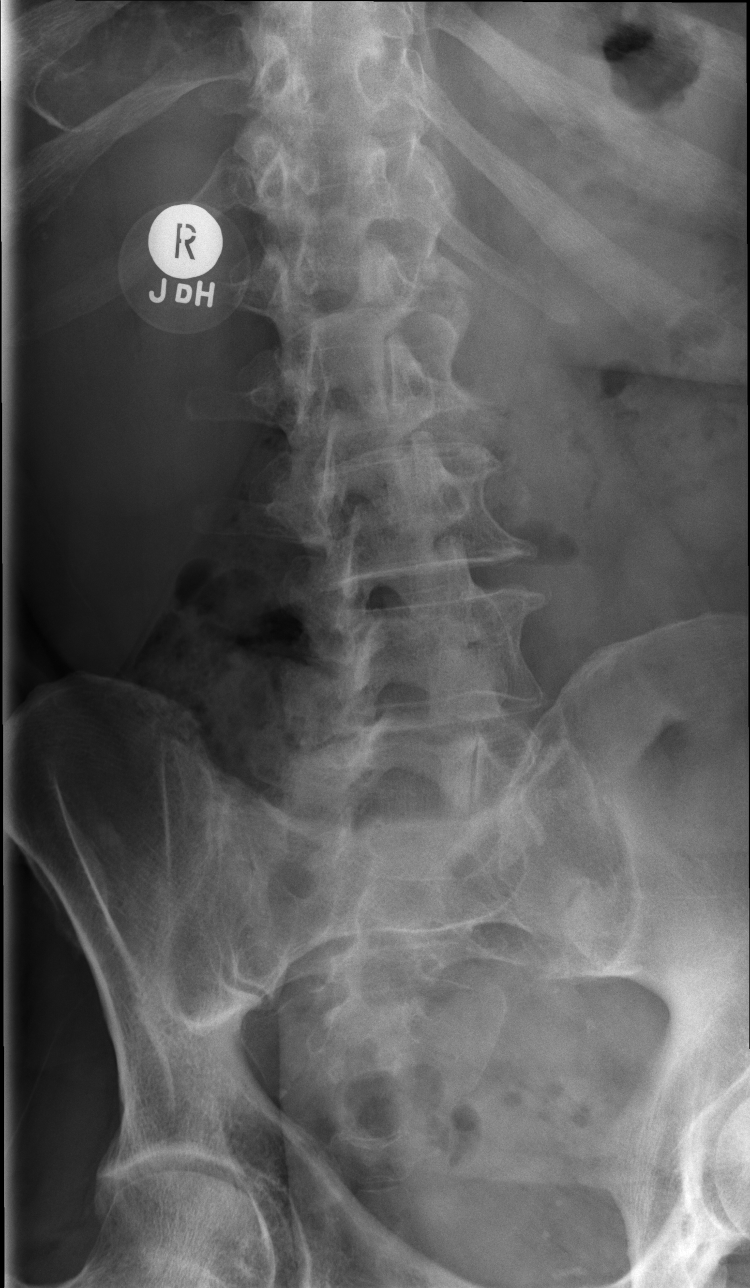

[w lumbar spine lat]
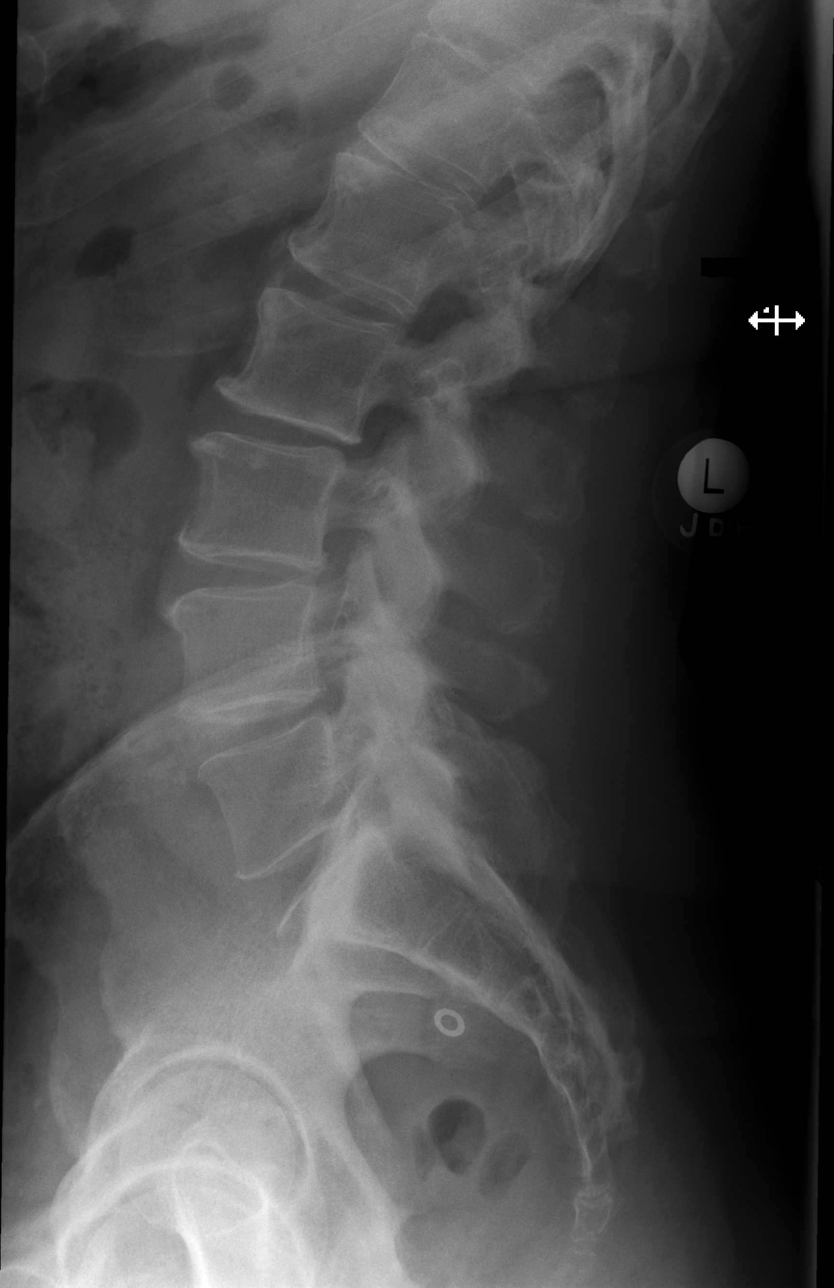

[w lumbar l-5 s-1 spot]
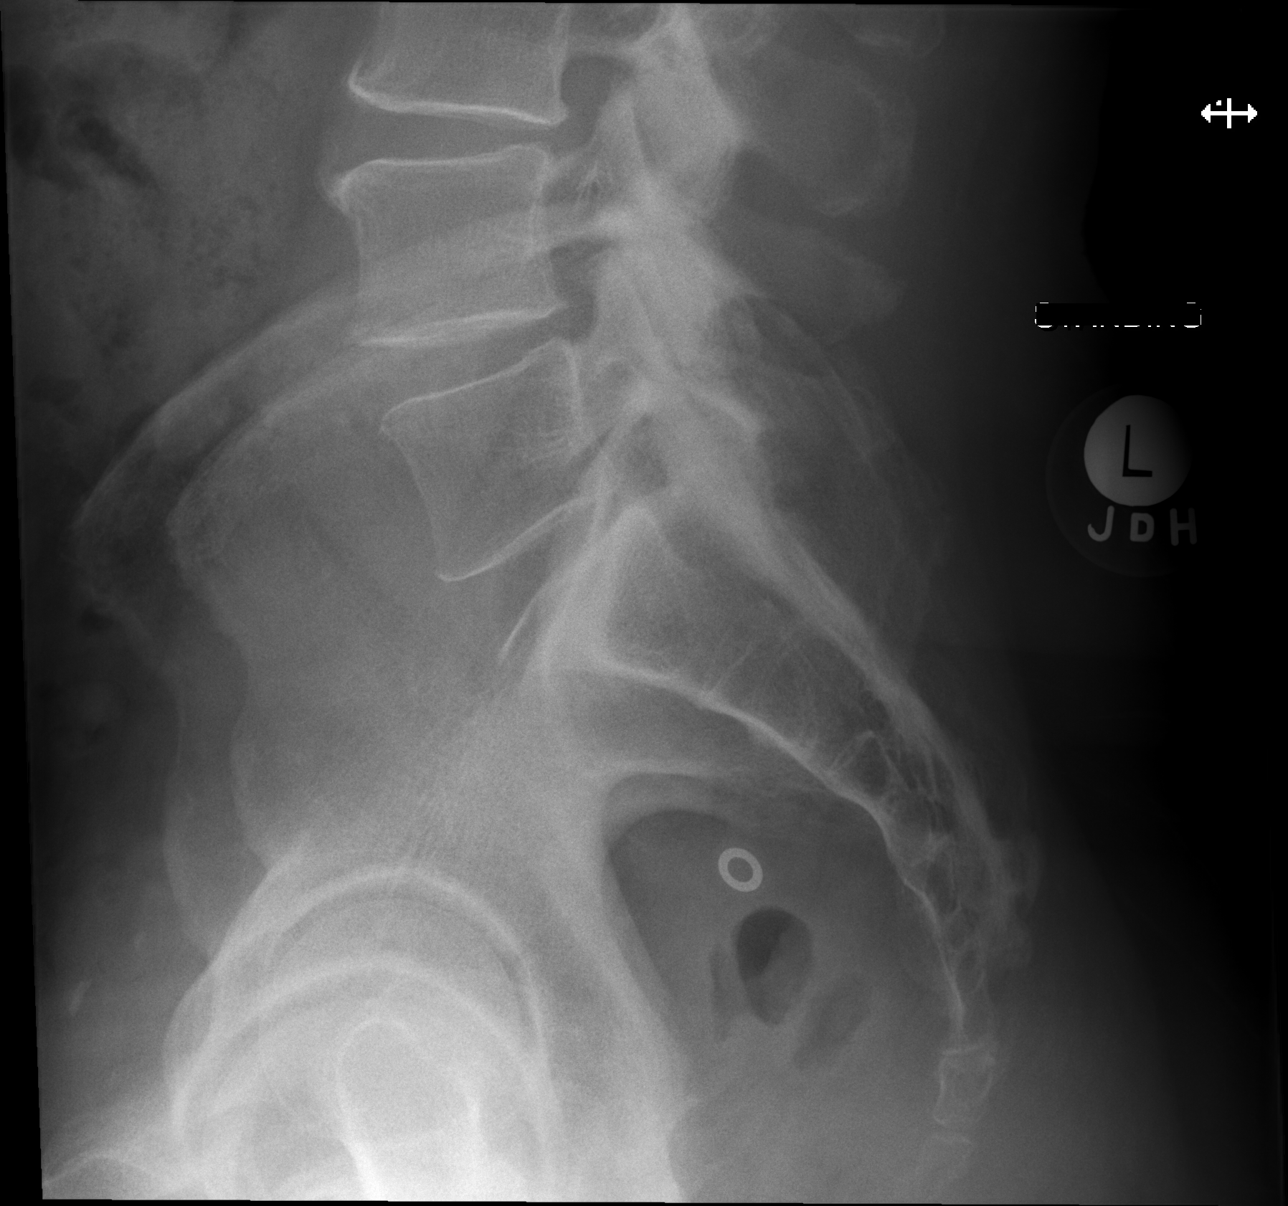

[5 of 5 positions shown; findings below may reference images not displayed]

FINDINGS: Scoliosis lumbar spine. Diffuse degenerative changes multilevel disc
degeneration again noted. No change from prior exam. No acute bony
abnormality identified. No evidence of fracture. Pelvic
calcifications consistent with phleboliths.
IMPRESSION: Scoliosis lumbar spine. Diffuse degenerative change with multilevel
disc degeneration again noted. No change from prior exam. No acute
abnormality identified.

## 2016-12-14 NOTE — Progress Notes (Signed)
   Subjective:    Patient ID: Ernest Ora., male    DOB: 1945-11-23, 71 y.o.   MRN: [id number]  HPI He complains of a three-week history of left leg discomfort. He states that he describes it as a throbbing and slightly numb sensation in the left lateral thigh area. Advil and Tylenol does help. Increased activity caused this to get worse but he also notes that the pain is worse if he lies on the left side. He does have a previous history of prostate cancer. He does not complain of any discomfort in the other extremities or bones. He does not complain of back pain.   Review of Systems     Objective:   Physical Exam Full motion of the hip and knee. Negative straight leg raising. No tenderness to palpation of his hip or knee. Ligaments in the knee are intact. Skin is normal. Pulses are 1-2+.      Assessment & Plan:  Hip pain, acute, left - Plan: DG Lumbar Spine Complete  Prostate cancer ([place])  At present his symptoms do not point towards anything in particular. Does not sound vascular as laying on that side should not be significant if it was vascular. I will start with an x-ray and proceed further. The x-ray showed no changes from previous study. I will have him take 2 Aleve twice per day for the next 10 days and patient attention to any symptoms. Reevaluate at that time if continued difficulty and possible referral.

## 2017-04-24 ENCOUNTER — Ambulatory Visit (INDEPENDENT_AMBULATORY_CARE_PROVIDER_SITE_OTHER): Payer: Medicare Other | Admitting: Family Medicine

## 2017-04-24 ENCOUNTER — Encounter: Payer: Self-pay | Admitting: Family Medicine

## 2017-04-24 VITALS — BP 120/70 | HR 59 | Temp 97.6°F | Wt 194.0 lb

## 2017-04-24 DIAGNOSIS — M7551 Bursitis of right shoulder: Secondary | ICD-10-CM

## 2017-04-24 DIAGNOSIS — Z23 Encounter for immunization: Secondary | ICD-10-CM | POA: Diagnosis not present

## 2017-04-24 MED ORDER — TRIAMCINOLONE ACETONIDE 40 MG/ML IJ SUSP
40.0000 mg | Freq: Once | INTRAMUSCULAR | Status: AC
  Administered 2017-04-24: 40 mg via INTRAMUSCULAR

## 2017-04-24 MED ORDER — LIDOCAINE HCL (PF) 2 % IJ SOLN
3.0000 mL | Freq: Once | INTRAMUSCULAR | Status: AC
  Administered 2017-04-24: 3 mL

## 2017-04-24 NOTE — Progress Notes (Signed)
   Subjective:    Patient ID: Ernest Ora., male    DOB: 1945/09/17, 71 y.o.   MRN: [id number]  HPI  He is here for evaluation of a one-month history of right shoulder pain. He has difficulty with abduction as well as internal and external rotation. No history of injury or overuse. No numbness, tingling or weakness. He had similar difficulty with his left shoulder and reviewed an injection which did help.  Review of Systems     Objective:   Physical Exam  Difficulty with full abduction of the shoulder. Drop arm test was uncomfortable. Empty can test was uncomfortable. Neer's and Hawkins test was painful. No shoulder laxity noted. Normal strength.      Assessment & Plan:  Need for influenza vaccination  Acute bursitis of right shoulder His history is consistent with rotator cuff tendinitis. He would like an injection. The shoulder was prepped with Betadine and the posterolateral area and 40 mg of Kenalog and 3 mL of Xylocaine was injected into the subacromial bursa without difficulty. He did obtain some relief of his symptoms relatively quickly. Encouraged him to use the exercises that he was taught on his other shoulder. He will call if further difficulty. Flu shot also given.

## 2017-08-14 HISTORY — PX: OTHER SURGICAL HISTORY: SHX169

## 2017-09-13 ENCOUNTER — Other Ambulatory Visit: Payer: Self-pay | Admitting: Family Medicine

## 2017-09-13 DIAGNOSIS — I10 Essential (primary) hypertension: Secondary | ICD-10-CM

## 2017-09-25 ENCOUNTER — Ambulatory Visit: Payer: Medicare Other | Admitting: Family Medicine

## 2017-09-25 ENCOUNTER — Encounter: Payer: Self-pay | Admitting: Family Medicine

## 2017-09-25 VITALS — BP 132/80 | HR 76 | Wt 199.0 lb

## 2017-09-25 DIAGNOSIS — M25561 Pain in right knee: Secondary | ICD-10-CM

## 2017-09-25 MED ORDER — LIDOCAINE HCL 2 % IJ SOLN
3.0000 mL | Freq: Once | INTRAMUSCULAR | Status: AC
  Administered 2017-09-25: 60 mg via INTRADERMAL

## 2017-09-25 MED ORDER — TRIAMCINOLONE ACETONIDE 40 MG/ML IJ SUSP
40.0000 mg | Freq: Once | INTRAMUSCULAR | Status: AC
Start: 2017-09-25 — End: 2017-09-25
  Administered 2017-09-25: 40 mg via INTRAMUSCULAR

## 2017-09-25 NOTE — Progress Notes (Signed)
   Subjective:    Patient ID: Ernest Hughes., male    DOB: November 05, 1945, 72 y.o.   MRN: [id number]  HPI He complains of a one-month history of right knee pain that occurred when he got out of bed.  It is the medial side.  Walking causes no trouble ever going up and down steps as well as bending his knee does cause some discomfort.  No popping, locking or grinding.  No history of overuse or injury.   Review of Systems     Objective:   Physical Exam Exam of the right knee shows no effusion.  No tenderness over the joint line.  Negative anterior drawer.  Medial and lateral collateral ligaments normal.  McMurray's testing medially did cause discomfort.       Assessment & Plan:  Right knee pain, unspecified chronicity - Plan: triamcinolone acetonide (KENALOG-40) injection 40 mg, lidocaine (XYLOCAINE) 2 % (with pres) injection 60 mg, DG Knee Complete 4 Views Right I explained that his knee symptoms are probably meniscal and degenerative in nature.  I will get an x-ray on him.  Also discussed injection.  We decided to go ahead and have this injected.  The knee was prepped laterally.  The joint line was identified.  40 mg of Kenalog and 3 cc of Xylocaine was injected into the joint space.  Explained that he might have steroid flare but otherwise should not have any difficulty.  We will continue to watch this and see how he progresses.  Explained that this is not necessarily something that surgery would benefit from and that physical therapy would probably be the best.  Demonstrated flexion and extension exercises for him.

## 2017-09-26 ENCOUNTER — Ambulatory Visit
Admission: RE | Admit: 2017-09-26 | Discharge: 2017-09-26 | Disposition: A | Discharge status: Home / Self Care | Payer: Medicare Other | Source: Ambulatory Visit | Attending: Family Medicine | Admitting: Family Medicine

## 2017-09-26 IMAGING — CR DG KNEE COMPLETE 4+V*R*
4 series · 4 of 4 positions shown · non-contrast
Comparison: None.

CLINICAL DATA: Medial knee pain for 4 weeks.  No acute injury.

EXAM:
RIGHT KNEE - COMPLETE 4+ VIEW

[w knee ap right]
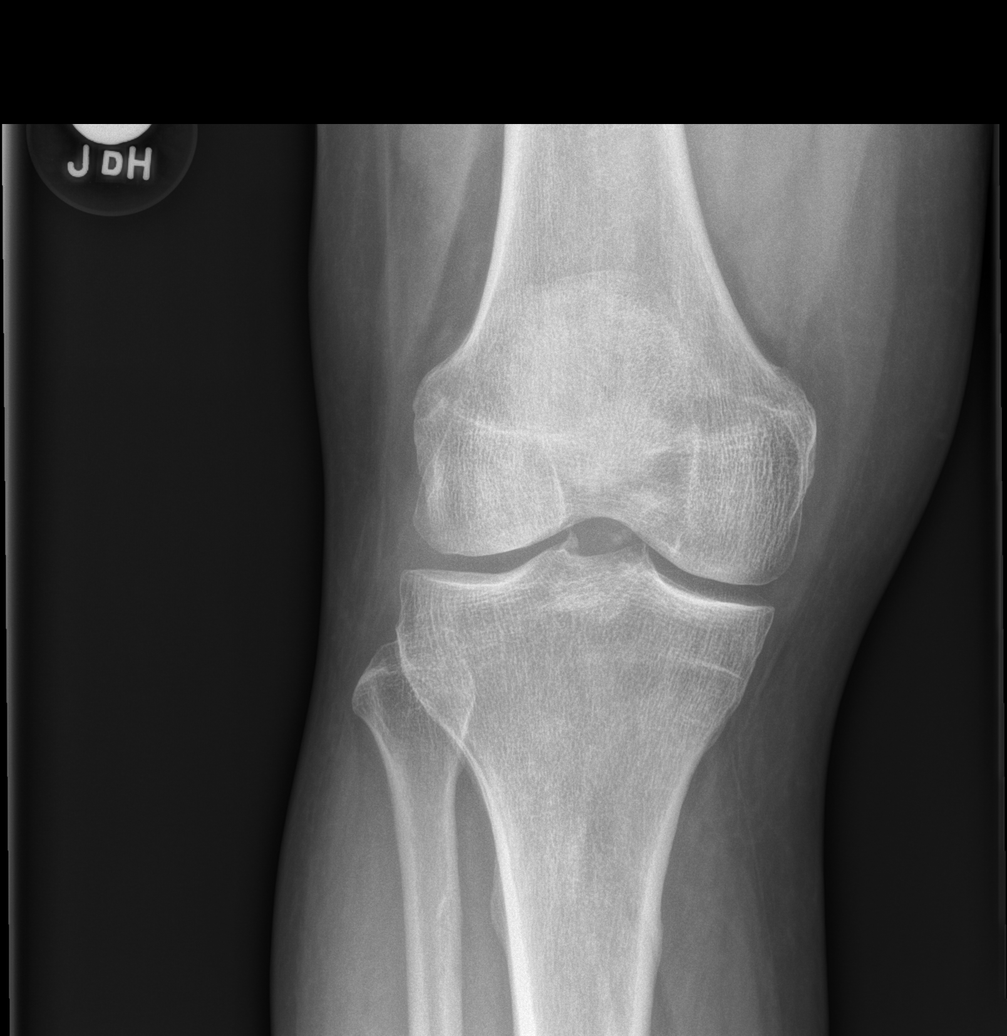

[w knee lat right]
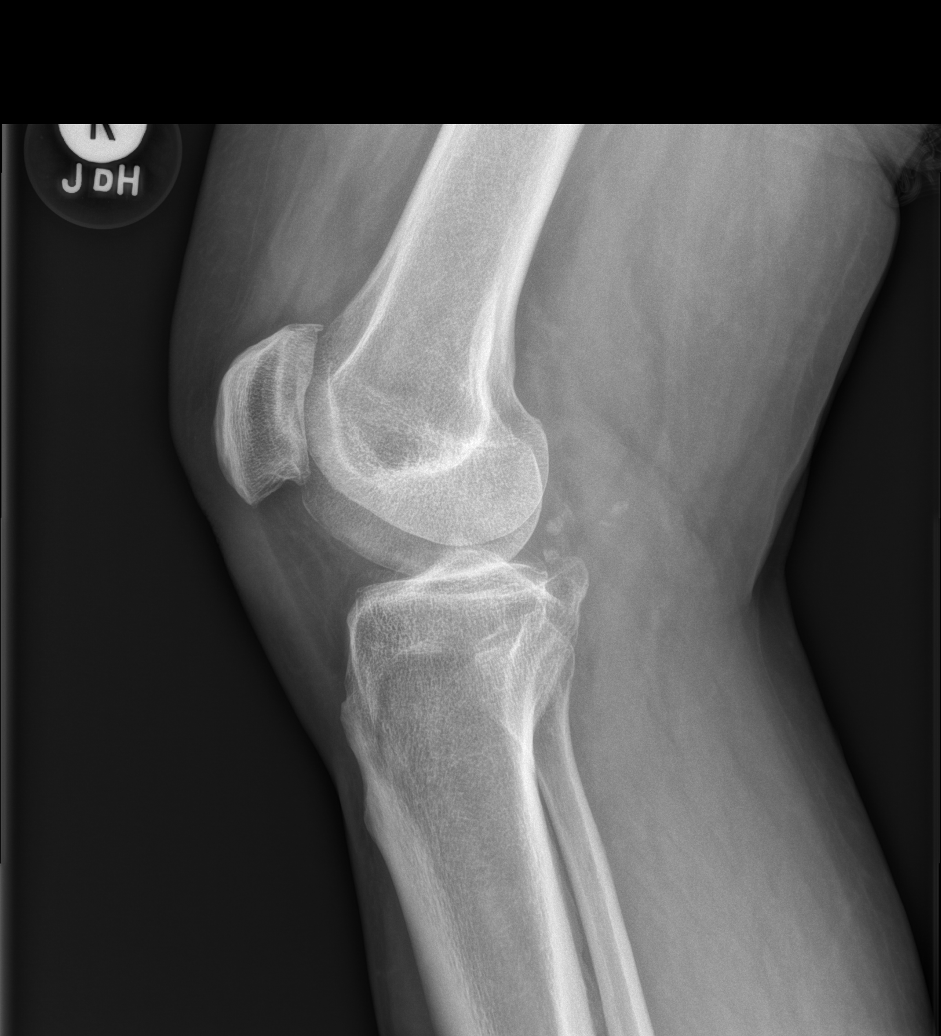

[x knee tunnel right]
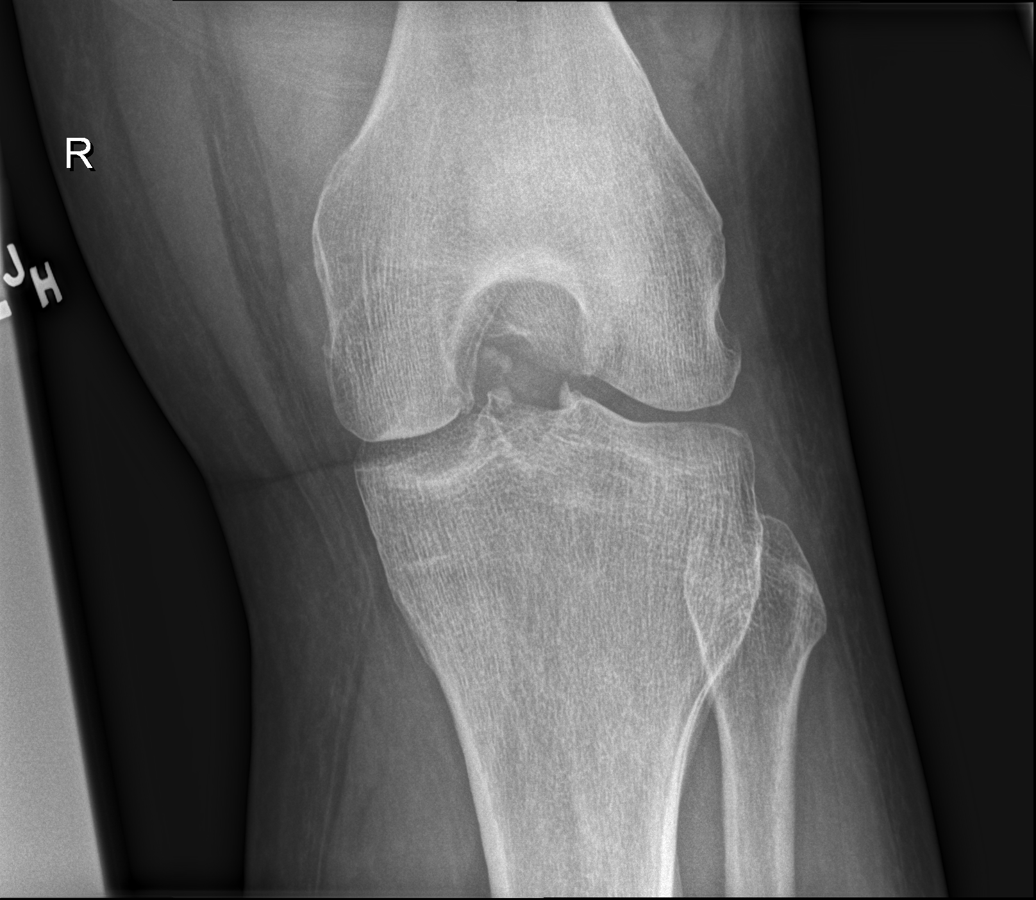

[x knee sunrise right]
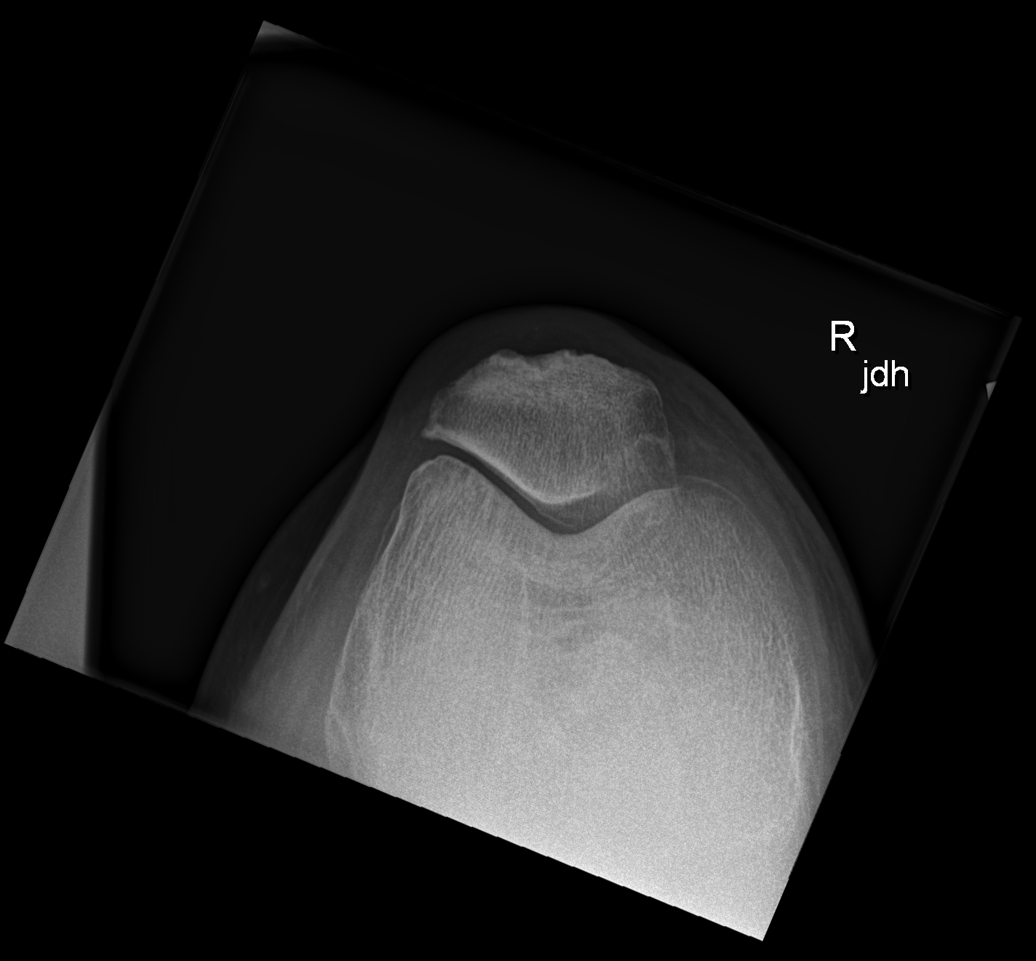

[4 of 4 positions shown; findings below may reference images not displayed]

FINDINGS: The mineralization and alignment are normal. There is no evidence of
acute fracture or dislocation. There are small patellofemoral
osteophytes. The medial and lateral compartment joint spaces are
preserved. There is a small joint effusion with small central loose
bodies.
IMPRESSION: Mild patellofemoral degenerative changes with small central loose
bodies. No acute osseous findings.

## 2017-09-27 ENCOUNTER — Telehealth: Payer: Self-pay

## 2017-09-27 NOTE — Telephone Encounter (Signed)
Pt called about x-ray results Please advise. Thanks [employer]

## 2017-09-28 NOTE — Telephone Encounter (Signed)
I sent him a message on my chart that he does have some degenerative changes and he saw some calcium deposits but I do not think that they are of any concern.

## 2017-09-28 NOTE — Telephone Encounter (Signed)
Results given to pt . [place]

## 2017-10-25 ENCOUNTER — Other Ambulatory Visit: Payer: Self-pay | Admitting: Family Medicine

## 2017-10-25 DIAGNOSIS — E7800 Pure hypercholesterolemia, unspecified: Secondary | ICD-10-CM

## 2017-10-31 ENCOUNTER — Telehealth: Payer: Self-pay | Admitting: Family Medicine

## 2017-10-31 NOTE — Telephone Encounter (Signed)
Pt was notified to stop aspirin

## 2017-10-31 NOTE — Telephone Encounter (Signed)
It is reasonable for him to stop the aspirin

## 2017-10-31 NOTE — Telephone Encounter (Signed)
Pt left message wanting to know if he should continue his Aspirin 81mg  every day.  Please call and let him know what you recommend.

## 2017-12-12 ENCOUNTER — Encounter: Payer: Self-pay | Admitting: Family Medicine

## 2017-12-12 ENCOUNTER — Ambulatory Visit: Payer: Medicare Other | Admitting: Family Medicine

## 2017-12-12 VITALS — BP 142/86 | HR 79 | Temp 98.0°F | Ht 69.0 in | Wt 197.8 lb

## 2017-12-12 DIAGNOSIS — I10 Essential (primary) hypertension: Secondary | ICD-10-CM | POA: Diagnosis not present

## 2017-12-12 DIAGNOSIS — E7800 Pure hypercholesterolemia, unspecified: Secondary | ICD-10-CM | POA: Diagnosis not present

## 2017-12-12 DIAGNOSIS — Z8546 Personal history of malignant neoplasm of prostate: Secondary | ICD-10-CM

## 2017-12-12 DIAGNOSIS — R5383 Other fatigue: Secondary | ICD-10-CM | POA: Diagnosis not present

## 2017-12-12 DIAGNOSIS — H269 Unspecified cataract: Secondary | ICD-10-CM | POA: Diagnosis not present

## 2017-12-12 DIAGNOSIS — K219 Gastro-esophageal reflux disease without esophagitis: Secondary | ICD-10-CM

## 2017-12-12 NOTE — Progress Notes (Signed)
Ernest Hughes. is a 72 y.o. male who presents for annual wellness visit and follow-up on chronic medical conditions.  He has the following concerns: He states that his left knee is doing much better.  He does have a left cataract and will have that surgically removed in the near future.  He also sees urology and they are interested in him having an appliance to help with urination.  He was given Cialis for his ED and has questions about this.  He does have a previous history of prostate cancer with radiation.  Occasionally does have some low back pain but usually responds to conservative care.  Does complain of some slight fatigue.  Continues on atorvastatin without difficulty.  Also taking amlodipine/olmesartan.  He does occasionally use omeprazole for reflux symptoms.   Immunizations and Health Maintenance Immunization History  Administered Date(s) Administered  . Influenza Split 05/22/2011, 05/02/2012  . Influenza Whole 08/05/2003, 06/11/2006  . Influenza, High Dose Seasonal PF 05/19/2013, 05/05/2014, 04/20/2015, 06/16/2016, 04/24/2017  . PPD Test 11/01/2009  . Pneumococcal Conjugate-13 01/29/2008  . Pneumococcal Polysaccharide-23 05/05/2014  . Td 06/14/2005  . Zoster 06/11/2006   Health Maintenance Due  Topic Date Due  . TETANUS/TDAP  06/15/2015    Last colonoscopy:cologuard 2016 Last PSA: 11/22/2017 Dentist:10/2017 Ophtho:12-06-17 Exercise:walking qd  Other doctors caring for patient include: Urology.  Dr. Katy Fitch  Advanced Directives:YES COPY asked for    Depression screen:  See questionnaire below.     Depression screen [facility] 2/9 06/16/2016 05/10/2015 05/10/2015 05/05/2014 05/05/2014  Decreased Interest 0 0 0 - 1  Down, Depressed, Hopeless 0 0 0 0 1  PHQ - 2 Score 0 0 0 0 2  Altered sleeping - - - 0 -  Tired, decreased energy - - - 0 -  PHQ-9 Score - - - 0 -    Fall Screen: See Questionaire below.   Fall Risk  06/16/2016 05/10/2015 05/10/2015 05/05/2014 05/05/2014  Falls in the  past year? No No No No No    ADL screen:  See questionnaire below.  Functional Status Survey:     Review of Systems  Constitutional: -, -unexpected weight change, -anorexia, -fatigue Allergy: -sneezing, -itching, -congestion Dermatology: denies changing moles, rash, lumps ENT: -runny nose, -ear pain, -sore throat,  Cardiology:  -chest pain, -palpitations, -orthopnea, Respiratory: -cough, -shortness of breath, -dyspnea on exertion, -wheezing,  Gastroenterology: -abdominal pain, -nausea, -vomiting, -diarrhea, -constipation, -dysphagia Hematology: -bleeding or bruising problems Musculoskeletal: -arthralgias, -myalgias, -joint swelling, -back pain, - Ophthalmology: -vision changes,  Urology: -dysuria, -difficulty urinating,  -urinary frequency, -urgency, incontinence Neurology: -, -numbness, , -memory loss, -falls, -dizziness    PHYSICAL EXAM:   General Appearance: Alert, cooperative, no distress, appears stated age Head: Normocephalic, without obvious abnormality, atraumatic Eyes: PERRL, conjunctiva/corneas clear, EOM's intact, fundi benign Ears: Normal TM's and external ear canals Nose: Nares normal, mucosa normal, no drainage or sinus   tenderness Throat: Lips, mucosa, and tongue normal; teeth and gums normal Neck: Supple, no lymphadenopathy, thyroid:no enlargement/tenderness/nodules; no carotid bruit or JVD Lungs: Clear to auscultation bilaterally without wheezes, rales or ronchi; respirations unlabored Heart: Regular rate and rhythm, S1 and S2 normal, no murmur, rub or gallop Abdomen: Soft, non-tender, nondistended, normoactive bowel sounds, no masses, no hepatosplenomegaly Extremities: No clubbing, cyanosis or edema Pulses: 2+ and symmetric all extremities Skin: Skin color, texture, turgor normal, no rashes or lesions Lymph nodes: Cervical, supraclavicular, and axillary nodes normal Neurologic: CNII-XII intact, normal strength, sensation and gait; reflexes 2+ and symmetric  throughout  Psych: Normal mood, affect, hygiene and grooming  ASSESSMENT/PLAN: Essential hypertension - Plan: CBC with Differential/Platelet, Comprehensive metabolic panel  Cataract of left eye, unspecified cataract type  Gastroesophageal reflux disease without esophagitis  HYPERCHOLESTEROLEMIA - Plan: Lipid panel  History of prostate cancer  Fatigue, unspecified type - Plan: CBC with Differential/Platelet, Comprehensive metabolic panel, TSH  Discussed the cataract surgery with him and encouraged him to go ahead and get the new lens implant.  Also recommend he go ahead with the urologic procedure to help with has nocturia.  Also discussed the use of Cialis and gave him instructions on proper use.   Discussed PSA screening (risks/benefits), recommended at least 30 minutes of aerobic activity at least 5 days/week; proper sunscreen use reviewed; healthy diet and alcohol recommendations (less than or equal to 2 drinks/day) reviewed; regular seatbelt use; changing batteries in smoke detectors. Immunization recommendations discussed.  Colonoscopy recommendations reviewed.   Medicare Attestation I have personally reviewed: The patient's medical and social history Their use of alcohol, tobacco or illicit drugs Their current medications and supplements The patient's functional ability including ADLs,fall risks, home safety risks, cognitive, and hearing and visual impairment Diet and physical activities Evidence for depression or mood disorders  The patient's weight, height, and BMI have been recorded in the chart.  I have made referrals, counseling, and provided education to the patient based on review of the above and I have provided the patient with a written personalized care plan for preventive services.

## 2017-12-13 LAB — CBC WITH DIFFERENTIAL/PLATELET
BASOS: 0 %
Basophils Absolute: 0 10*3/uL (ref 0.0–0.2)
EOS (ABSOLUTE): 0.1 10*3/uL (ref 0.0–0.4)
EOS: 2 %
HEMATOCRIT: 43 % (ref 37.5–51.0)
HEMOGLOBIN: 13.7 g/dL (ref 13.0–17.7)
Immature Grans (Abs): 0 10*3/uL (ref 0.0–0.1)
Immature Granulocytes: 0 %
LYMPHS ABS: 2.2 10*3/uL (ref 0.7–3.1)
Lymphs: 48 %
MCH: 27.8 pg (ref 26.6–33.0)
MCHC: 31.9 g/dL (ref 31.5–35.7)
MCV: 87 fL (ref 79–97)
MONOCYTES: 7 %
Monocytes Absolute: 0.3 10*3/uL (ref 0.1–0.9)
Neutrophils Absolute: 2 10*3/uL (ref 1.4–7.0)
Neutrophils: 43 %
Platelets: 192 10*3/uL (ref 150–379)
RBC: 4.92 x10E6/uL (ref 4.14–5.80)
RDW: 15 % (ref 12.3–15.4)
WBC: 4.6 10*3/uL (ref 3.4–10.8)

## 2017-12-13 LAB — COMPREHENSIVE METABOLIC PANEL
ALBUMIN: 3.8 g/dL (ref 3.5–4.8)
ALT: 16 IU/L (ref 0–44)
AST: 15 IU/L (ref 0–40)
Albumin/Globulin Ratio: 1.3 (ref 1.2–2.2)
Alkaline Phosphatase: 70 IU/L (ref 39–117)
BUN / CREAT RATIO: 16 (ref 10–24)
BUN: 14 mg/dL (ref 8–27)
Bilirubin Total: 0.4 mg/dL (ref 0.0–1.2)
CALCIUM: 8.8 mg/dL (ref 8.6–10.2)
CO2: 24 mmol/L (ref 20–29)
CREATININE: 0.9 mg/dL (ref 0.76–1.27)
Chloride: 103 mmol/L (ref 96–106)
GFR calc Af Amer: 99 mL/min/{1.73_m2} (ref >59)
GFR, EST NON AFRICAN AMERICAN: 86 mL/min/{1.73_m2} (ref >59)
GLOBULIN, TOTAL: 3 g/dL (ref 1.5–4.5)
Glucose: 92 mg/dL (ref 65–99)
Potassium: 4.2 mmol/L (ref 3.5–5.2)
Sodium: 140 mmol/L (ref 134–144)
Total Protein: 6.8 g/dL (ref 6.0–8.5)

## 2017-12-13 LAB — LIPID PANEL
CHOL/HDL RATIO: 2.8 ratio (ref 0.0–5.0)
Cholesterol, Total: 139 mg/dL (ref 100–199)
HDL: 49 mg/dL (ref >39)
LDL CALC: 70 mg/dL (ref 0–99)
Triglycerides: 98 mg/dL (ref 0–149)
VLDL Cholesterol Cal: 20 mg/dL (ref 5–40)

## 2017-12-13 LAB — TSH: TSH: 1.62 u[IU]/mL (ref 0.450–4.500)

## 2017-12-18 ENCOUNTER — Other Ambulatory Visit: Payer: Self-pay | Admitting: Family Medicine

## 2017-12-18 DIAGNOSIS — I10 Essential (primary) hypertension: Secondary | ICD-10-CM

## 2018-03-23 ENCOUNTER — Other Ambulatory Visit: Payer: Self-pay | Admitting: Family Medicine

## 2018-03-23 DIAGNOSIS — I10 Essential (primary) hypertension: Secondary | ICD-10-CM

## 2018-05-15 ENCOUNTER — Telehealth: Payer: Self-pay | Admitting: Family Medicine

## 2018-05-15 NOTE — Telephone Encounter (Signed)
See message from Dr. Redmond School

## 2018-05-15 NOTE — Telephone Encounter (Signed)
Pt came in and stated that he no longer wants to take Zantac 150 due to the recalls and cancer threat. He would like to switch to something else. Pt is leaving to go out of town on Friday and would like to know prior to then. Pt uses [facility] and can be reached at [phone].

## 2018-05-15 NOTE — Telephone Encounter (Signed)
He can try Tagamet, Pepcid or Axid, OTC

## 2018-05-17 NOTE — Telephone Encounter (Signed)
Pt informed

## 2018-06-21 ENCOUNTER — Other Ambulatory Visit: Payer: Self-pay | Admitting: Family Medicine

## 2018-06-21 DIAGNOSIS — I10 Essential (primary) hypertension: Secondary | ICD-10-CM

## 2018-07-23 ENCOUNTER — Telehealth: Payer: Self-pay | Admitting: Family Medicine

## 2018-07-23 NOTE — Telephone Encounter (Signed)
Pt states got notice due for Cologard and he wants to know if he should have another Cologard since he had polyps or should he have colonoscopy and when he should have them? Dr. Sharlett Iles is GI

## 2018-07-24 NOTE — Telephone Encounter (Signed)
I think he can have another Cologuard.  It looks like the colonoscopy showed benign polyps

## 2018-07-24 NOTE — Telephone Encounter (Signed)
LVM for pt that he could have the cologuard. [place]

## 2018-07-26 ENCOUNTER — Other Ambulatory Visit: Payer: Self-pay

## 2018-07-26 DIAGNOSIS — Z1211 Encounter for screening for malignant neoplasm of colon: Secondary | ICD-10-CM

## 2018-07-26 NOTE — Telephone Encounter (Signed)
Pt advised and order was put in and sent over. [place]

## 2018-07-26 NOTE — Telephone Encounter (Signed)
Pt called back and would like to cologuard set up. Please look at message from Maudie Mercury dated yesterday. [phone] to advise pt.

## 2018-07-27 NOTE — Telephone Encounter (Signed)
done

## 2018-07-28 ENCOUNTER — Other Ambulatory Visit: Payer: Self-pay | Admitting: Family Medicine

## 2018-07-28 DIAGNOSIS — E7800 Pure hypercholesterolemia, unspecified: Secondary | ICD-10-CM

## 2018-08-09 LAB — COLOGUARD: Cologuard: NEGATIVE

## 2018-08-12 NOTE — Progress Notes (Signed)
other

## 2018-09-20 ENCOUNTER — Other Ambulatory Visit: Payer: Self-pay | Admitting: Family Medicine

## 2018-09-20 DIAGNOSIS — I10 Essential (primary) hypertension: Secondary | ICD-10-CM

## 2018-10-04 ENCOUNTER — Ambulatory Visit: Payer: Medicare Other | Admitting: Family Medicine

## 2018-10-04 ENCOUNTER — Encounter: Payer: Self-pay | Admitting: Family Medicine

## 2018-10-04 VITALS — BP 134/88 | HR 88 | Temp 98.0°F | Wt 201.0 lb

## 2018-10-04 DIAGNOSIS — M25561 Pain in right knee: Secondary | ICD-10-CM | POA: Diagnosis not present

## 2018-10-04 NOTE — Patient Instructions (Signed)
Always start with Tylenol and then if you need to you can add either 4 Advil 3 times per day or 2 Aleve twice per day.  Do this for couple weeks and see if it quiets it down if not call me

## 2018-10-04 NOTE — Progress Notes (Signed)
   Subjective:    Patient ID: Ernest Ora., male    DOB: 1946/05/29, 73 y.o.   MRN: [id number]  HPI He complains of a 2-week history of right knee pain medially.  No history of injury or overuse.  No popping, locking or grinding.  He has been using some Tylenol.   Review of Systems     Objective:   Physical Exam Alert and in no distress.  Small effusion is noted.  Tender to palpation along the medial joint line with McMurray's testing causing pain.  Anterior drawer and medial as well as lateral collateral ligaments intact.       Assessment & Plan:  Acute pain of right knee Recommend Tylenol and then either Advil or Aleve for the next 2 weeks as well as heat for 20 minutes 3 times per day.  If he is having continued trouble, he will call me for an x-ray and then an appointment after the x-ray

## 2018-10-14 ENCOUNTER — Encounter: Payer: Self-pay | Admitting: Family Medicine

## 2018-10-14 ENCOUNTER — Ambulatory Visit: Payer: Medicare Other | Admitting: Family Medicine

## 2018-10-14 VITALS — BP 120/84 | HR 87 | Temp 98.1°F | Wt 192.2 lb

## 2018-10-14 DIAGNOSIS — M25561 Pain in right knee: Secondary | ICD-10-CM

## 2018-10-14 NOTE — Patient Instructions (Signed)
Heat for 20 minutes 3 times per day and 800 mg of Advil 3 times per day which is 4 pills 3 times per day.  If you start having stomach irritation or see any black tarry stools then let me know.  Use the medication for about 10 days to 2 weeks and then see if it will quiet down if it does not then call me and I will get an x-ray then get you back over here for possible injection

## 2018-10-14 NOTE — Progress Notes (Signed)
   Subjective:    Patient ID: Ernest Ora., male    DOB: 12-19-1945, 73 y.o.   MRN: [id number]  HPI He is here for a recheck.  He continues to have difficulty with the right knee causing pain.  He was using up to 5 Advil per day and heat with minimal response.  Apparently yesterday he turned the corner in his knee is causing much less difficulty.   Review of Systems     Objective:   Physical Exam Minimal if any effusion is noted of the knee.  Slight tenderness palpation along the medial joint line.  McMurray's testing causes pain medially.  Negative anterior drawer.  Ligaments intact.        Assessment & Plan:  Acute pain of right knee Heat for 20 minutes 3 times per day and 800 mg of Advil 3 times per day which is 4 pills 3 times per day.  If you start having stomach irritation or see black tarry stools and let me know.  Use medication for about 10 days to 2 weeks and then see if it will quiet down if it does not then call me and I will get an x-ray and then get you back over here for possible injection

## 2018-10-28 ENCOUNTER — Other Ambulatory Visit: Payer: Self-pay

## 2018-10-28 ENCOUNTER — Ambulatory Visit: Payer: Medicare Other | Admitting: Family Medicine

## 2018-10-28 ENCOUNTER — Encounter: Payer: Self-pay | Admitting: Family Medicine

## 2018-10-28 VITALS — BP 132/82 | HR 61 | Temp 98.0°F | Wt 190.4 lb

## 2018-10-28 DIAGNOSIS — M25561 Pain in right knee: Secondary | ICD-10-CM

## 2018-10-28 MED ORDER — LIDOCAINE-EPINEPHRINE (PF) 1 %-1:200000 IJ SOLN
10.0000 mL | Freq: Once | INTRAMUSCULAR | Status: AC
  Administered 2018-10-28: 10 mL via INTRADERMAL

## 2018-10-28 MED ORDER — TRIAMCINOLONE ACETONIDE 40 MG/ML IJ SUSP
40.0000 mg | Freq: Once | INTRAMUSCULAR | Status: AC
  Administered 2018-10-28: 40 mg via INTRAMUSCULAR

## 2018-10-28 NOTE — Progress Notes (Signed)
   Subjective:    Patient ID: Ernest Ora., male    DOB: [birth year], 73 y.o.   MRN: [id number]  HPI He is here for recheck on his knee.  He states that in spite of conservative care he continues to have difficulty with pain and decreased functional ability.   Review of Systems     Objective:   Physical Exam Alert and in no distress.  Minimal effusion is noted.  Ligaments intact.  Negative anterior drawer and McMurray's testing.  X-rays were reviewed.      Assessment & Plan:  Acute pain of right knee - Plan: triamcinolone acetonide (KENALOG-40) injection 40 mg, lidocaine-EPINEPHrine (XYLOCAINE-EPINEPHrine) 1 %-1:200000 (PF) injection 10 mL I discussed the options with him in this regard.  He decided to have an injection.  40 mg of Kenalog and 3 cc of Xylocaine were injected into the joint without difficulty.  He tolerated the procedure well and did get some relief of his symptoms.  Encouraged him to stay physically active and do physical therapy to help with his symptoms.  He will keep me informed.  Explained that we would try like to avoid any surgical intervention and he is comfortable with that.

## 2018-11-01 ENCOUNTER — Other Ambulatory Visit: Payer: Self-pay

## 2018-11-01 ENCOUNTER — Encounter: Payer: Self-pay | Admitting: Family Medicine

## 2018-11-01 ENCOUNTER — Ambulatory Visit: Payer: Medicare Other | Admitting: Family Medicine

## 2018-11-01 VITALS — BP 138/88 | HR 80 | Temp 97.8°F | Wt 188.2 lb

## 2018-11-01 DIAGNOSIS — M25561 Pain in right knee: Secondary | ICD-10-CM

## 2018-11-01 MED ORDER — TRAMADOL HCL 50 MG PO TABS: 50.0000 mg | ORAL_TABLET | Freq: Three times a day (TID) | ORAL | 0 refills | Status: DC | PRN

## 2018-11-01 NOTE — Progress Notes (Signed)
   Subjective:    Patient ID: Ernest Ora., male    DOB: 1946-01-04, 73 y.o.   MRN: [id number]  HPI He is here for recheck on right knee pain.  He was given an injection on March 13 with Xylocaine and Kenalog.  He did obtain some relatively quick relief but then his symptoms got worse over the last couple of days.  No history of injury.  It hurts even when he just sits.   Review of Systems     Objective:   Physical Exam Exam of the right knee shows no palpable pain, swelling, warmth.  Ligaments intact.  McMurray's testing negative.  Femoral pulses normal.  Negative Homans sign. Previous x-rays do show some patellofemoral changes     Assessment & Plan:  Acute pain of right knee - Plan: MR Knee Right Wo Contrast, traMADol (ULTRAM) 50 MG tablet This is a little bit unusual in that he is having knee pain with just minimal or no physical activity.  He is to continue Advil 800 mg 3 times daily and I will give him tramadol and see what the MRI shows.

## 2018-11-11 ENCOUNTER — Other Ambulatory Visit: Payer: Self-pay

## 2018-11-11 ENCOUNTER — Telehealth: Payer: Self-pay | Admitting: Family Medicine

## 2018-11-11 DIAGNOSIS — M25561 Pain in right knee: Secondary | ICD-10-CM

## 2018-11-11 NOTE — Telephone Encounter (Signed)
Pt called about the MRI, advised [facility] not scheduling any imaging at this time, he states he needs to know what he can do, he's still hobbling around in pain, can't take the Tramadol causes dizziness, he is taking the Ibuprofen, can't walk around much, wants to know if can get a brace or crutches or something to help while waiting.  Please call

## 2018-11-11 NOTE — Telephone Encounter (Signed)
Pt was advised [facility] 

## 2018-11-11 NOTE — Telephone Encounter (Signed)
Tell him that we are caught by this virus issue.  Go ahead and refer him to an orthopedic surgeon of his choice and if he does not have any then over to [place]

## 2018-11-12 ENCOUNTER — Encounter (INDEPENDENT_AMBULATORY_CARE_PROVIDER_SITE_OTHER): Payer: Self-pay | Admitting: Orthopaedic Surgery

## 2018-11-12 ENCOUNTER — Ambulatory Visit (INDEPENDENT_AMBULATORY_CARE_PROVIDER_SITE_OTHER): Payer: Medicare Other

## 2018-11-12 ENCOUNTER — Other Ambulatory Visit: Payer: Self-pay

## 2018-11-12 ENCOUNTER — Ambulatory Visit (INDEPENDENT_AMBULATORY_CARE_PROVIDER_SITE_OTHER): Payer: Medicare Other | Admitting: Orthopaedic Surgery

## 2018-11-12 DIAGNOSIS — M25561 Pain in right knee: Secondary | ICD-10-CM | POA: Diagnosis not present

## 2018-11-12 DIAGNOSIS — G8929 Other chronic pain: Secondary | ICD-10-CM

## 2018-11-12 NOTE — Progress Notes (Signed)
Office Visit Note   Patient: Ernest Hughes.           Date of Birth: [birth year]           MRN: [id number] Visit Date: 11/12/2018              Requested by: Denita Lung, MD [place], [place] [id number] PCP: Denita Lung, MD   Assessment & Plan: Visit Diagnoses:  1. Chronic pain of right knee     Plan: At this point I agree that an MRI is warranted of his right knee to rule out a meniscal tear and to assess for loose bodies.  Hopefully this can be ordered and done at some point given the delays as it relates to the coronavirus.  In the interim, I will have him try Tylenol arthritis as well as a hinged knee brace to see if that will help give him some stability of the knee when he is walking.  I do want him to participate in his walking routine but to avoid going up and down hills.  All questions concerns were answered and addressed as it relates to his right knee.  Follow-Up Instructions: Follow-up will be established once an MRI is obtained of the right knee.  Orders:  Orders Placed This Encounter  Procedures  . XR Knee 1-2 Views Right   No orders of the defined types were placed in this encounter.     Procedures: No procedures performed   Clinical Data: No additional findings.   Subjective: Chief Complaint  Patient presents with  . Right Knee - Pain  The patient is a very pleasant and active 73 year old gentleman sent from Dr. Redmond School to evaluate and treat acute right knee pain.  He is been having right knee pain for about a month now.  He was given a steroid injection that did help for maybe a day.  He has had no known injury.  His knee gets very stiff on him and he cannot walk his exercise routine walk that he used to.  He points the medial side of his knee is the source of his pain.  Anti-inflammatories are started about her stomach too much.  Dr. Redmond School did give him a tramadol to try for pain but it made the patient too sleepy and drowsy.   He denies any locking catching but does report significant pain.  At this point is detriment affecting his mobility, his quality of life and his actives daily living.  They tried ordering an MRI and he called over there himself but MRIs are not allowing that type of study right now based on the coronavirus pandemic.   HPI  Review of Systems He currently denies any headache, chest pain, shortness of breath, fever, chills, nausea, vomiting Objective: Vital Signs: There were no vitals taken for this visit.  Physical Exam He is alert and orient x3 and in no acute distress Ortho Exam Examination of his right knee shows no effusion.  He does have medial joint line tenderness and pain along the medial collateral ligament.  He does have a positive Murray sign the medial compartment of his knee.  His range of motion is full.  The knee feels ligamentously stable. Specialty Comments:  No specialty comments available.  Imaging: Xr Knee 1-2 Views Right  Result Date: 11/12/2018 2 views of the right knee show no acute findings or malalignment.  There is evidence of osteophytes and potential loose bodies involving the medial  compartment.    PMFS History: Patient Active Problem List   Diagnosis Date Noted  . Onychomycosis 05/05/2014  . History of prostate cancer 05/22/2011  . GERD 02/02/2009  . HYPERCHOLESTEROLEMIA 02/01/2009  . Essential hypertension 02/01/2009  . Diverticulosis of colon 02/01/2009   Past Medical History:  Diagnosis Date  . BPH (benign prostatic hyperplasia)   . Cancer ([place])    PROSTATE--treated with radiation  . Diverticulosis   . GERD (gastroesophageal reflux disease)   . Hypertension     Family History  Problem Relation Age of Onset  . Cancer Mother   . Cancer Father   . Heart disease Sister   . Cancer Maternal Aunt   . Cancer Paternal Aunt   . Cancer Paternal Grandfather     Past Surgical History:  Procedure Laterality Date  . BELPHAROPTOSIS REPAIR  12/2013    bilateral  . COLONOSCOPY     Dr. Sharlett Iles   Social History   Occupational History  . Not on file  Tobacco Use  . Smoking status: Never Smoker  . Smokeless tobacco: Never Used  Substance and Sexual Activity  . Alcohol use: Yes    Comment: has some wine once in a while   . Drug use: No  . Sexual activity: Not Currently

## 2018-11-19 ENCOUNTER — Telehealth (INDEPENDENT_AMBULATORY_CARE_PROVIDER_SITE_OTHER): Payer: Self-pay | Admitting: Orthopaedic Surgery

## 2018-11-19 NOTE — Telephone Encounter (Signed)
See message below °

## 2018-11-19 NOTE — Telephone Encounter (Signed)
Patient left a message stating that his knee is not any better and that it is still swollen.  The knee brace that he put on is not helping him either.  He would like a call back advising what he should do for the swelling whether it be ice or heat.  CB#[phone].  Thank you.

## 2018-11-20 ENCOUNTER — Encounter (INDEPENDENT_AMBULATORY_CARE_PROVIDER_SITE_OTHER): Payer: Self-pay | Admitting: Orthopaedic Surgery

## 2018-11-20 ENCOUNTER — Ambulatory Visit (INDEPENDENT_AMBULATORY_CARE_PROVIDER_SITE_OTHER): Payer: Medicare Other | Admitting: Orthopaedic Surgery

## 2018-11-20 ENCOUNTER — Other Ambulatory Visit: Payer: Self-pay

## 2018-11-20 DIAGNOSIS — M25561 Pain in right knee: Secondary | ICD-10-CM | POA: Diagnosis not present

## 2018-11-20 DIAGNOSIS — G8929 Other chronic pain: Secondary | ICD-10-CM | POA: Diagnosis not present

## 2018-11-20 NOTE — Progress Notes (Signed)
HPI: Ernest Hughes returns today due to right knee pain increasing swelling increasing.  He was recently seen by Dr. Ninfa Linden on 3031 2020 and a MRI of his right knee was ordered.  However due to the COVID-19 pandemic is been unable to have the MRI done.  Said no new injury to the knee.  Take Tylenol arthritis for the pain.  Is walking about a mile a day no inclines or hills.  Physical exam right knee: Positive effusion.  Tenderness along medial joint line.  Good range of motion the knee no instability valgus varus stressing.  Impression: Right knee pain/effusion  Plan: Right knee is prepped with Betadine then from a superior lateral approach aspiration is performed 20 cc of yellow synovial fluid is obtained patient tolerates well.  Ace wrap is applied.  Leave this on until this evening.  He can use the Ace wrap instead of his knee brace if he finds this more comfortable but would not wear it at night.  He will continue to walk for exercise suggested he may be walk just 1/4 mile a day.  We will see him back after the MRI to get the results and discuss further treatment.  Would consider possible repeat steroid injection in his knee if his pain persist.

## 2018-11-21 ENCOUNTER — Telehealth (INDEPENDENT_AMBULATORY_CARE_PROVIDER_SITE_OTHER): Payer: Self-pay | Admitting: Orthopaedic Surgery

## 2018-11-21 NOTE — Telephone Encounter (Signed)
Please advise 

## 2018-11-21 NOTE — Telephone Encounter (Signed)
Patient called wanting to know if it is normal to have swelling in the bottom part of the leg after the knee gets drained and also he would like to know if Artis Delay would like him to put the ace bandage back on during the day.  CB#[phone].  Thank you

## 2018-11-21 NOTE — Telephone Encounter (Signed)
Patient aware of the below message  

## 2018-11-21 NOTE — Telephone Encounter (Signed)
Yes and yes 

## 2018-12-06 ENCOUNTER — Other Ambulatory Visit: Payer: Self-pay

## 2018-12-06 ENCOUNTER — Ambulatory Visit
Admission: RE | Admit: 2018-12-06 | Discharge: 2018-12-06 | Disposition: A | Discharge status: Home / Self Care | Payer: Medicare Other | Source: Ambulatory Visit | Attending: Family Medicine | Admitting: Family Medicine

## 2018-12-06 DIAGNOSIS — M25561 Pain in right knee: Secondary | ICD-10-CM

## 2018-12-06 IMAGING — MR MRI OF THE RIGHT KNEE WITHOUT CONTRAST
4 of 6 series · 23 of 40 positions shown · non-contrast
Comparison: Right knee x-rays dated [DATE].

CLINICAL DATA: Right knee pain and swelling for the past 5 weeks.
No injury.

EXAM:
MRI OF THE RIGHT KNEE WITHOUT CONTRAST
TECHNIQUE: Multiplanar, multisequence MR imaging of the knee was performed. No
intravenous contrast was administered.

[Series 4: T2 fat-sat · coronal · 4.0mm · 0.59mm/px · 6 of 26 slices shown (1 of 2)]
[im 1/26]
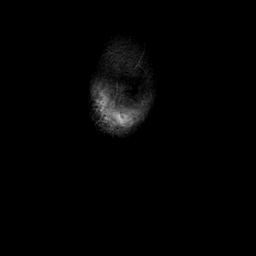
[im 6/26]
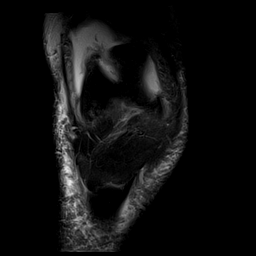
[im 11/26]
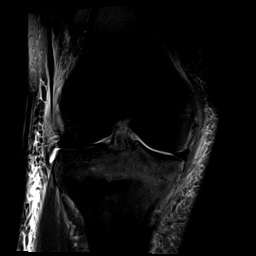
[im 16/26]
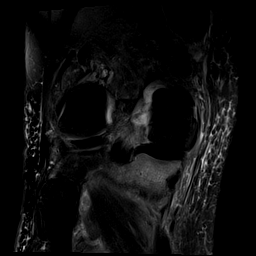
[im 21/26]
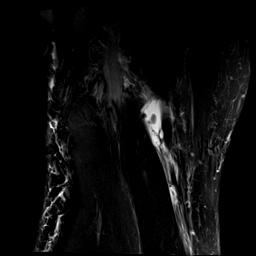
[im 26/26]
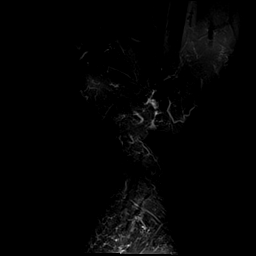

[Series 5: T1 · coronal · 4.0mm · 0.29mm/px · 3 of 26 slices shown]
[im 6/26]
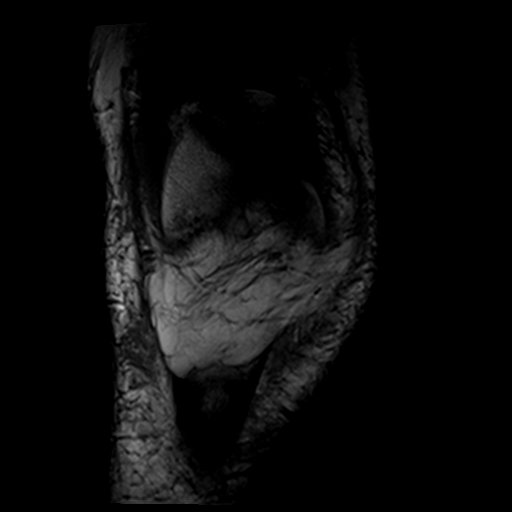
[im 16/26]
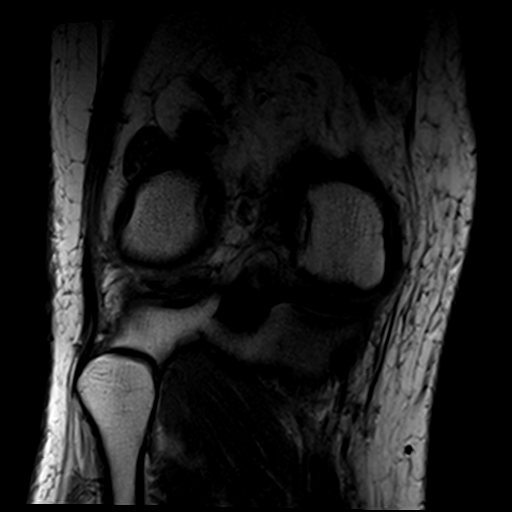
[im 26/26]
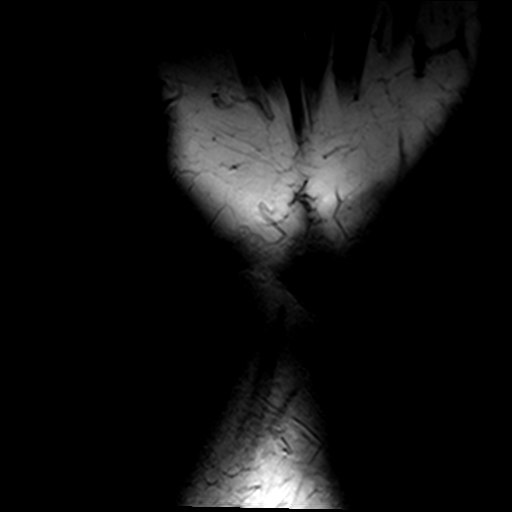

[Series 7: T2 fat-sat · sagittal · 3.0mm · 0.29mm/px · 7 of 30 slices shown (2 of 2)]
[im 1/30]
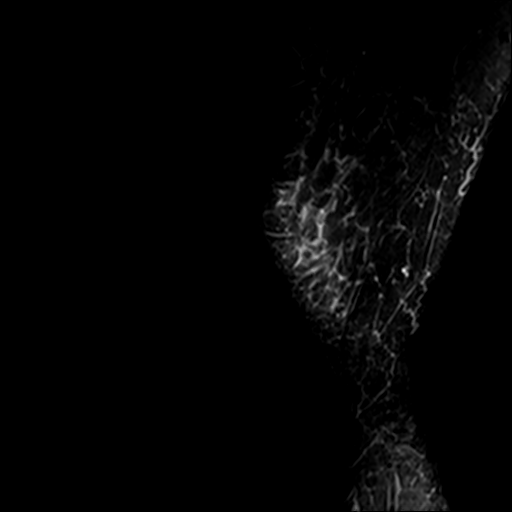
[im 5/30]
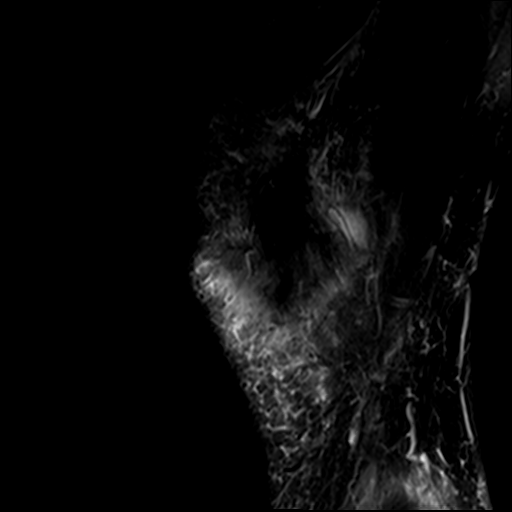
[im 10/30]
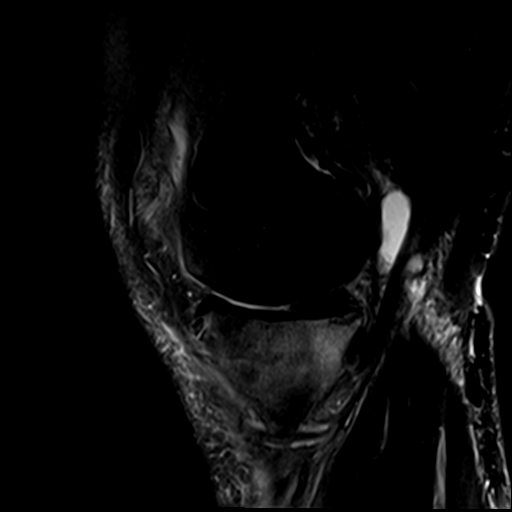
[im 15/30]
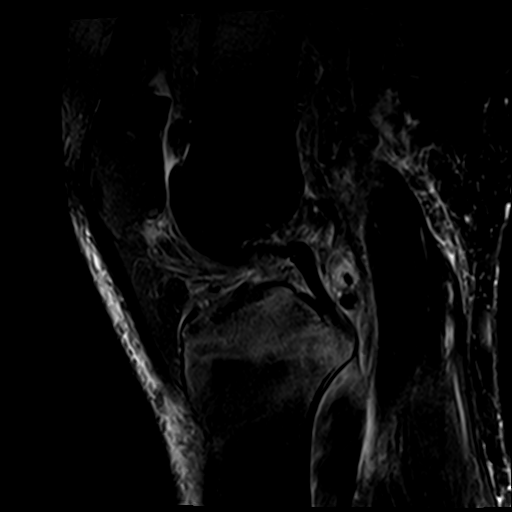
[im 20/30]
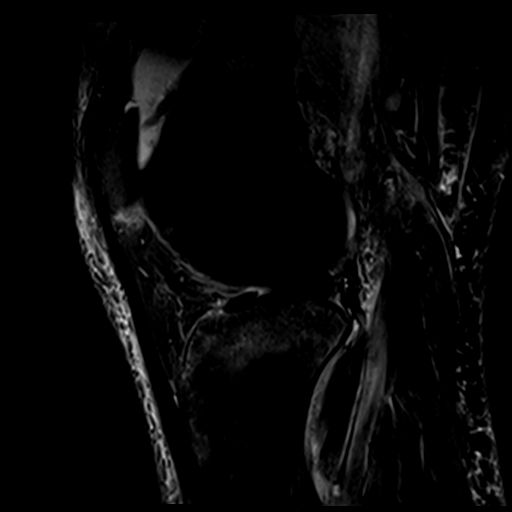
[im 25/30]
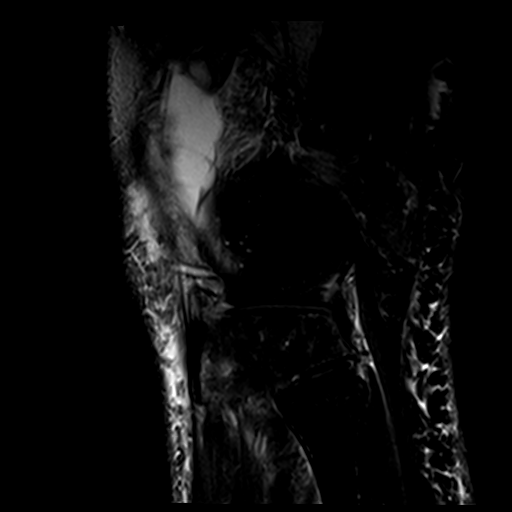
[im 30/30]
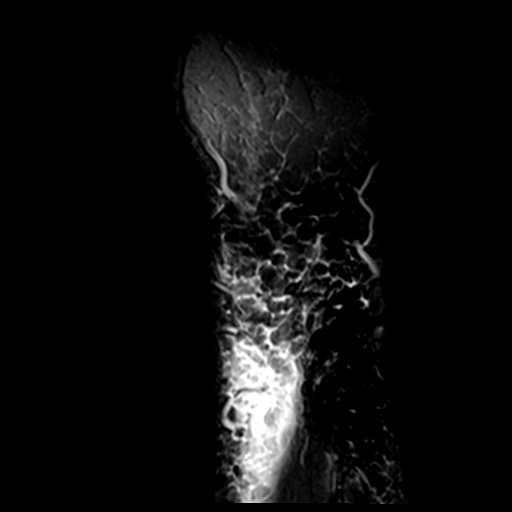

[Series 8: PD fat-sat · sagittal · 3.0mm · 0.29mm/px · 7 of 30 slices shown]
[im 1/30]
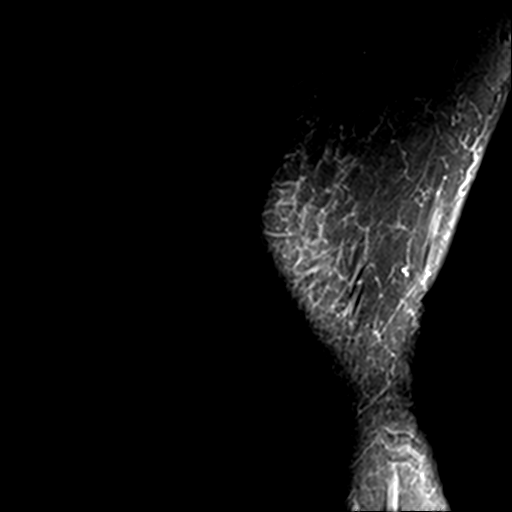
[im 5/30]
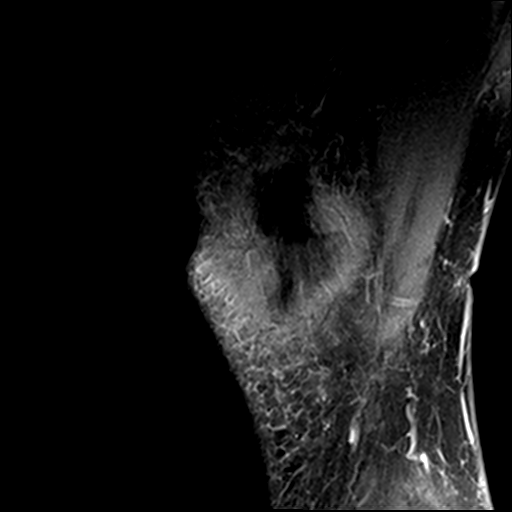
[im 10/30]
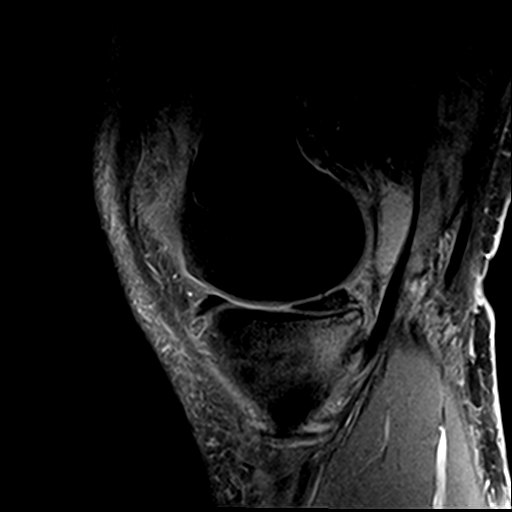
[im 15/30]
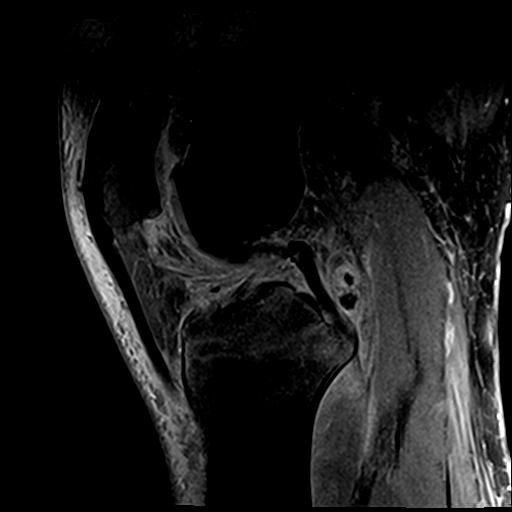
[im 20/30]
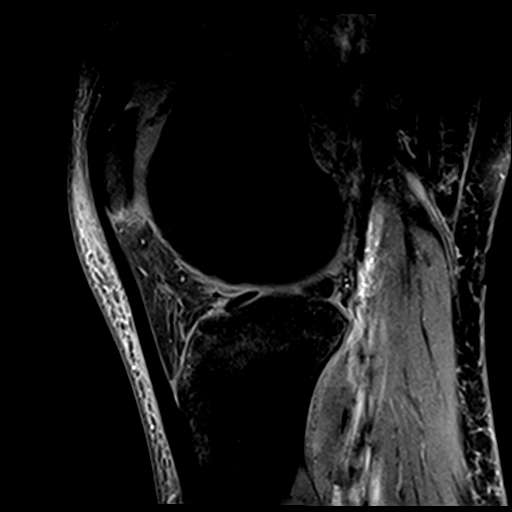
[im 25/30]
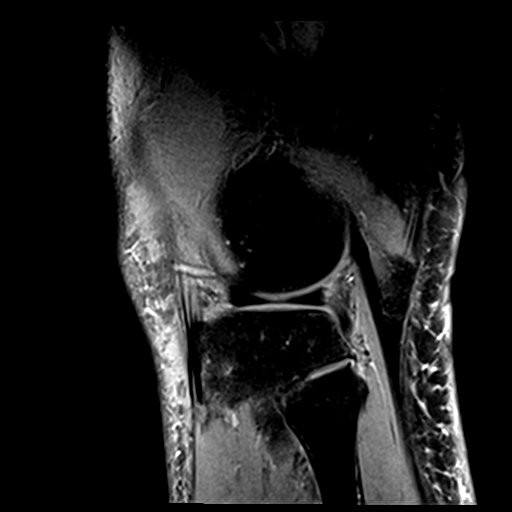
[im 30/30]
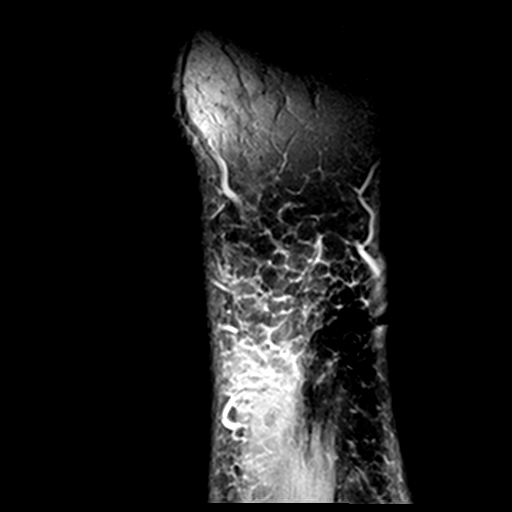

[23 of 40 positions shown; findings below may reference images not displayed]

FINDINGS: MENISCI

Medial meniscus: Large radial tear of the body/posterior horn
junction.

Lateral meniscus:  Intact.

LIGAMENTS

Cruciates:  Intact ACL and PCL.

Collaterals: Medial collateral ligament is intact. Lateral
collateral ligament complex is intact.

CARTILAGE

Patellofemoral: Diffuse thinning with high-grade near full-thickness
cartilage loss over the lateral trochlea and superior patellar apex.

Medial: Diffuse thinning with near full-thickness delamination along
the mesial aspect of the medial femoral condyle.

Lateral: Diffuse thinning with early cartilage degeneration over the
central lateral tibial plateau. No focal defect.

Joint: Small joint effusion. Synovitis and small intra-articular
bodies in the posterior joint space posterior to the PCL. Edema
within Hoffa's fat. No plical thickening.

Popliteal Fossa: Small Baker cyst containing a small intra-articular
body. Intact popliteus tendon.

Extensor Mechanism: Intact quadriceps tendon and patellar tendon.
Intact medial and lateral patellar retinaculum. Intact MPFL.

Bones: Subchondral insufficiency fracture of the posterior medial
tibial plateau with adjacent marrow edema. No dislocation. No
suspicious bone lesion.

Other: Mild diffuse soft tissue swelling about the knee. No soft
tissue mass or fluid collection.
IMPRESSION: 1. Subchondral insufficiency fracture of the posterior medial tibial
plateau.
2. Large radial tear of the medial meniscus body/posterior horn
junction.
3. Tricompartmental osteoarthritis, moderate in the lateral
patellofemoral compartment.
4. Small joint effusion and Baker cyst with intra-articular bodies.

## 2018-12-10 ENCOUNTER — Telehealth: Payer: Self-pay

## 2018-12-10 ENCOUNTER — Telehealth (INDEPENDENT_AMBULATORY_CARE_PROVIDER_SITE_OTHER): Payer: Self-pay | Admitting: Orthopaedic Surgery

## 2018-12-10 NOTE — Telephone Encounter (Signed)
Pt called and wanted to know the results of MRI on knee and next steps. [place]

## 2018-12-10 NOTE — Telephone Encounter (Signed)
Patient called in re to his MRI stated it should be back by now.  Please all patient to advise.

## 2018-12-10 NOTE — Telephone Encounter (Signed)
Pt was advise to contact Dr. Ninfa Linden office to follow up. [place]

## 2018-12-10 NOTE — Telephone Encounter (Signed)
Let him know that the MRI did show some damage.  He should hear soon from Dr. Ninfa Linden concerning the plan for this

## 2018-12-10 NOTE — Telephone Encounter (Signed)
Pt is calling for right knee MRI results from test on 12/06/18

## 2018-12-11 NOTE — Telephone Encounter (Signed)
Please advise 

## 2018-12-11 NOTE — Telephone Encounter (Signed)
I spoke to him in detail.  I let him know that we need to see him in the office Monday next week to go over this in more detail and talk about options.

## 2018-12-17 ENCOUNTER — Ambulatory Visit: Payer: Medicare Other | Admitting: Family Medicine

## 2018-12-18 ENCOUNTER — Other Ambulatory Visit: Payer: Self-pay | Admitting: Family Medicine

## 2018-12-18 DIAGNOSIS — I10 Essential (primary) hypertension: Secondary | ICD-10-CM

## 2019-01-16 ENCOUNTER — Other Ambulatory Visit: Payer: Self-pay | Admitting: Family Medicine

## 2019-01-16 DIAGNOSIS — E7800 Pure hypercholesterolemia, unspecified: Secondary | ICD-10-CM

## 2019-01-17 ENCOUNTER — Ambulatory Visit: Payer: Medicare Other | Admitting: Family Medicine

## 2019-01-17 ENCOUNTER — Other Ambulatory Visit: Payer: Self-pay

## 2019-01-17 ENCOUNTER — Encounter: Payer: Self-pay | Admitting: Medical

## 2019-01-17 VITALS — BP 120/80 | HR 71 | Temp 98.4°F | Resp 16 | Wt 177.2 lb

## 2019-01-17 DIAGNOSIS — L237 Allergic contact dermatitis due to plants, except food: Secondary | ICD-10-CM

## 2019-01-17 DIAGNOSIS — L03114 Cellulitis of left upper limb: Secondary | ICD-10-CM

## 2019-01-17 MED ORDER — METHYLPREDNISOLONE ACETATE 40 MG/ML IJ SUSP
80.0000 mg | Freq: Once | INTRAMUSCULAR | Status: AC
  Administered 2019-01-17: 14:00:00 80 mg via INTRAMUSCULAR

## 2019-01-17 MED ORDER — TRIAMCINOLONE ACETONIDE 0.1 % EX CREA: 1.0000 "application " | TOPICAL_CREAM | Freq: Two times a day (BID) | CUTANEOUS | 0 refills | Status: DC

## 2019-01-17 MED ORDER — PREDNISONE 10 MG (21) PO TBPK: ORAL_TABLET | Freq: Every day | ORAL | 0 refills | Status: DC

## 2019-01-17 MED ORDER — CEPHALEXIN 500 MG PO CAPS: 500.0000 mg | ORAL_CAPSULE | Freq: Three times a day (TID) | ORAL | 0 refills | Status: DC

## 2019-01-17 NOTE — Telephone Encounter (Signed)
Pt was advised 01/17/2019 (at visit)  that he was overdue for cpe and he needed to schedule.

## 2019-01-17 NOTE — Progress Notes (Signed)
   Subjective:    Patient ID: Ernest Ora., male    DOB: 1946/06/20, 73 y.o.   MRN: [id number]  HPI Chief Complaint  Patient presents with  . poison    poison X 2 days   Here with complaints of a red, pruritic rash on his left forearm for the past 2 days after working in the yard. He thinks he was exposed to poison ivy. Has a history of allergic reactions to poison ivy.  States his arm is swelling and warm but denies pain. No numbness, tingling or weakness of his left upper extremity.  Denies fever, chills, nausea, vomiting, diarrhea.  He has been using over-the-counter hydrocortisone and calamine lotion.  Denies history of MRSA or diabetes.   He is immunocompetent.  Reviewed allergies, medications, past medical, surgical, family, and social history.    Review of Systems Pertinent positives and negatives in the history of present illness.      Objective:   Physical Exam BP 120/80   Pulse 71   Temp 98.4 F (36.9 C) (Oral)   Resp 16   Wt 177 lb 3.2 oz (80.4 kg)   SpO2 98%   BMI 26.17 kg/m   Alert and oriented and in no acute distress.  Cardiac exam shows RRR.  Lungs are CTA.  LUE with multiple linear pruritic red, raised rash to his anterior forearm surrounded by a large area of erythema, edema and warmth.  The area is nontender.  Normal sensation, capillary refill, pulse and range of motion to left upper extremity.  He also has a linear pruritic red rash on his right forearm without any surrounding erythema or edema.      Assessment & Plan:  Allergic contact dermatitis due to plants, except food - Plan: predniSONE (STERAPRED UNI-PAK 21 TAB) 10 MG (21) TBPK tablet, triamcinolone cream (KENALOG) 0.1 %, methylPREDNISolone acetate (DEPO-MEDROL) injection 80 mg  Cellulitis of left upper extremity - Plan: cephALEXin (KEFLEX) 500 MG capsule, methylPREDNISolone acetate (DEPO-MEDROL) injection 80 mg  Contact dermatitis most likely due to poison ivy with significant  widespread pruritic rash to bilateral forearms, steroid injection given in the office and he tolerated this well.  I will also send home a steroid Dosepak prescription for him to take starting tomorrow.  Discussed that he has what appears to be early cellulitis and we will cover him with Keflex.  No history of MRSA. Discussed cool showers and cool compresses to help control itching along with Benadryl at bedtime if needed. He is aware that if he develops fever, worsening swelling or pain of the left forearm that he should be seen right away.  He will follow-up with me on Monday otherwise.

## 2019-01-17 NOTE — Patient Instructions (Signed)
Start the steroid dose pak tomorrow since you received a steroid injection in our office today.  Use the triamcinolone steroid cream and ice packs or cool compresses to help with itching.  You may also take Benadryl if needed at bedtime for itching but be aware this may cause drowsiness.   You may start the antibiotic today since this appears to be an early cellulitis.  Follow up with me Monday

## 2019-01-20 ENCOUNTER — Encounter: Payer: Self-pay | Admitting: Family Medicine

## 2019-01-20 ENCOUNTER — Ambulatory Visit: Payer: Medicare Other | Admitting: Family Medicine

## 2019-01-20 ENCOUNTER — Other Ambulatory Visit: Payer: Self-pay

## 2019-01-20 VITALS — BP 130/80 | HR 56 | Temp 98.4°F | Wt 180.0 lb

## 2019-01-20 DIAGNOSIS — L237 Allergic contact dermatitis due to plants, except food: Secondary | ICD-10-CM | POA: Diagnosis not present

## 2019-01-20 DIAGNOSIS — L03114 Cellulitis of left upper limb: Secondary | ICD-10-CM

## 2019-01-20 MED ORDER — AMOXICILLIN-POT CLAVULANATE 875-125 MG PO TABS
1.0000 | ORAL_TABLET | Freq: Two times a day (BID) | ORAL | 0 refills | Status: DC
Start: 2019-01-20 — End: 2019-06-24

## 2019-01-20 NOTE — Patient Instructions (Signed)
Stop the cephalaxin (Keflex) antibiotic that you were taking and start on the Augmentin today.   Continue using the triamcinolone sparingly and use cool compresses as needed.   You may take Benadryl at bedtime for itching and be aware that this is sedating.   Follow up with Dr. Redmond School if you are getting worse or if not improving in the next 2-3 days.

## 2019-01-20 NOTE — Progress Notes (Signed)
   Subjective:    Patient ID: Ernest Hughes., male    DOB: 1946/03/10, 73 y.o.   MRN: [id number]  HPI Chief Complaint  Patient presents with  . poison ivy    not any better. it has spreaded up arm. now spreading to other arm   Is here for follow-up on contact dermatitis, presumably poison ivy and skin infection.  He was seen here on Friday and given Depo-Medrol 80 mg IM in the office and prescribed a steroid Dosepak, Keflex along with triamcinolone.  He reports that his arm was significantly bothersome over the weekend with itching.  States the swelling has improved.  He has been using ice packs to help control itching. States he does not feel like his arm is any better, he thinks it is actually worse.  He does have a small area on his right forearm which is also more red and pruritic.   Denies fever, chills, nausea, vomiting, numbness, tingling or weakness.  Reviewed allergies, medications, past medical, surgical, family, and social history.   Review of Systems Pertinent positives and negatives in the history of present illness.     Objective:   Physical Exam Constitutional:      General: He is not in acute distress.    Appearance: Normal appearance. He is not ill-appearing.  Pulmonary:     Effort: Pulmonary effort is normal.  Musculoskeletal:       Arms:     Comments: Large area of erythema with some evidence of a maculopapular rash on left forearm. No edema or drainage. LUE with normal sensation, pulses, cap refill and ROM.  A small linear raised rash on his right forearm without any sign of infection.   Skin:    General: Skin is warm and dry.     Capillary Refill: Capillary refill takes less than 2 seconds.  Neurological:     General: No focal deficit present.     Mental Status: He is alert.    BP 130/80   Pulse (!) 56   Temp 98.4 F (36.9 C) (Oral)   Wt 180 lb (81.6 kg)   BMI 26.58 kg/m       Assessment & Plan:  Cellulitis of left upper extremity - Plan:  amoxicillin-clavulanate (AUGMENTIN) 875-125 MG tablet  Allergic contact dermatitis due to plants, except food  Discussed that he appears to me to have made some improvements since his visit Friday. He does have the photos from Friday on his phone as well as from over the weekend.  Edema has basically resolved but he still does have a considerable amount of erythema and itching. We will switch the antibiotic to Augmentin and have him complete the prednisone dose pak and continue with Triamcinolone and cool compresses. May take Benadryl at bedtime for itching.  Dr. Redmond School also examined patient today and is in agreement with plan of care. The area was marked by Dr. Redmond School.  He may follow up if worsening or if not continuing to improve in the next 2-3 days with me or Dr. Redmond School.

## 2019-01-22 ENCOUNTER — Other Ambulatory Visit: Payer: Self-pay

## 2019-01-22 ENCOUNTER — Encounter: Payer: Self-pay | Admitting: Orthopaedic Surgery

## 2019-01-22 ENCOUNTER — Ambulatory Visit (INDEPENDENT_AMBULATORY_CARE_PROVIDER_SITE_OTHER): Payer: Medicare Other | Admitting: Orthopaedic Surgery

## 2019-01-22 DIAGNOSIS — G8929 Other chronic pain: Secondary | ICD-10-CM | POA: Diagnosis not present

## 2019-01-22 DIAGNOSIS — M25561 Pain in right knee: Secondary | ICD-10-CM

## 2019-01-22 DIAGNOSIS — S83241D Other tear of medial meniscus, current injury, right knee, subsequent encounter: Secondary | ICD-10-CM

## 2019-01-22 NOTE — Progress Notes (Signed)
The patient comes in for review of an MRI of his right knee.  He has been having worsening knee pain for some time now mainly activity related and it all happened only over a short period time after he woke up.  He said the last 3 to 4 weeks have been better.  He is wearing a copper fit knee sleeve.  He is already had an aspiration and injection in that knee.  His plain films still showed a well-maintained joint space so we sent him for an MRI.  He points the medial side of his knee as a source of his pain.  The MRI is reviewed with him and it does show a complex posterior horn to mid body medial meniscal tear on the right knee.  There is a significant amount edema in the subchondral bone all along the tibial plateau showing a stress response and stress fracture.  There is significant cartilage thinning in the medial compartment of the knee and near full-thickness cartilage loss in one area.  On exam there is still mild effusion of his right knee and medial joint line tenderness with good range of motion.  I talked him about the possibility of arthroscopic intervention for his right knee which would include a partial medial meniscectomy and a sub-chondroplasty of the medial tibial plateau.  I explained in detail what this meant showing him his MRI and a knee model.  He would also be a candidate for hyaluronic acid to try.  All question concerns were answered addressed.  I gave him a copy of his MRI report as well.  We talked about all different options of nonoperative and operative treatment.  He is going to talk this over with his wife and give [place] a call back.

## 2019-02-21 ENCOUNTER — Encounter: Payer: Self-pay | Admitting: Gastroenterology

## 2019-03-06 ENCOUNTER — Other Ambulatory Visit: Payer: Self-pay | Admitting: Family Medicine

## 2019-03-06 DIAGNOSIS — E7800 Pure hypercholesterolemia, unspecified: Secondary | ICD-10-CM

## 2019-03-13 ENCOUNTER — Other Ambulatory Visit: Payer: Self-pay | Admitting: Family Medicine

## 2019-03-13 DIAGNOSIS — E7800 Pure hypercholesterolemia, unspecified: Secondary | ICD-10-CM

## 2019-03-13 DIAGNOSIS — I10 Essential (primary) hypertension: Secondary | ICD-10-CM

## 2019-04-28 ENCOUNTER — Ambulatory Visit: Payer: Medicare Other | Admitting: Family Medicine

## 2019-04-28 ENCOUNTER — Other Ambulatory Visit: Payer: Self-pay

## 2019-04-28 VITALS — BP 138/86 | HR 64 | Temp 97.5°F | Ht 69.0 in | Wt 173.0 lb

## 2019-04-28 DIAGNOSIS — K219 Gastro-esophageal reflux disease without esophagitis: Secondary | ICD-10-CM

## 2019-04-28 DIAGNOSIS — Z0000 Encounter for general adult medical examination without abnormal findings: Secondary | ICD-10-CM

## 2019-04-28 DIAGNOSIS — E7800 Pure hypercholesterolemia, unspecified: Secondary | ICD-10-CM | POA: Diagnosis not present

## 2019-04-28 DIAGNOSIS — H269 Unspecified cataract: Secondary | ICD-10-CM

## 2019-04-28 DIAGNOSIS — Z9849 Cataract extraction status, unspecified eye: Secondary | ICD-10-CM | POA: Insufficient documentation

## 2019-04-28 DIAGNOSIS — Z8546 Personal history of malignant neoplasm of prostate: Secondary | ICD-10-CM | POA: Diagnosis not present

## 2019-04-28 DIAGNOSIS — M1990 Unspecified osteoarthritis, unspecified site: Secondary | ICD-10-CM

## 2019-04-28 DIAGNOSIS — I10 Essential (primary) hypertension: Secondary | ICD-10-CM | POA: Diagnosis not present

## 2019-04-28 DIAGNOSIS — K5730 Diverticulosis of large intestine without perforation or abscess without bleeding: Secondary | ICD-10-CM

## 2019-04-28 DIAGNOSIS — B351 Tinea unguium: Secondary | ICD-10-CM

## 2019-04-28 DIAGNOSIS — N529 Male erectile dysfunction, unspecified: Secondary | ICD-10-CM

## 2019-04-28 MED ORDER — ATORVASTATIN CALCIUM 10 MG PO TABS: 10.0000 mg | ORAL_TABLET | Freq: Every day | ORAL | 3 refills | Status: DC

## 2019-04-28 MED ORDER — AMLODIPINE-OLMESARTAN 5-20 MG PO TABS: 1.0000 | ORAL_TABLET | Freq: Every day | ORAL | 3 refills | Status: DC

## 2019-04-28 NOTE — Progress Notes (Signed)
Ernest Alexanders, MD   04/28/2019    Ernest Hughes. is a 73 y.o. male who presents for CPE,annual wellness visit and follow-up on chronic medical conditions.  He has questions concerning the use of Prevagen to help with his memory.  He also continues have difficulty with right knee pain.  He did have an MRI which did show some changes in there.  He states that he is only 20% better.  1 of the options with surgery versus injection.  He continues on Flomax for his bladder related problems.  Does have a history of prostate cancer and his last PSA was 0.1.  He is followed by urology for that.  He also uses Cialis for erectile dysfunction and gets some minimal benefit from that.  He is on a weight loss program and drinking plenty of fluids.  He has lost roughly 18 pounds and is very happy with this.  He has been drinking extra fluids which does make him urinate more especially at night.  He continues on Lipitor and having no difficulty with that.  He is also taking amlodipine/olmesartan for his blood pressure and doing well on that.  He has concerns over thickening of all of his toenails.  He has had a colonoscopy which did show diverticulosis.  He is retired and enjoying his retirement.  Otherwise his family and social history is unchanged.    Immunizations and Health Maintenance Immunization History  Administered Date(s) Administered  . Fluad Quad(high Dose 65+) 04/15/2019  . Influenza Split 05/22/2011, 05/02/2012  . Influenza Whole 08/05/2003, 06/11/2006  . Influenza, High Dose Seasonal PF 05/19/2013, 05/05/2014, 04/20/2015, 06/16/2016, 04/24/2017  . PPD Test 11/01/2009  . Pneumococcal Conjugate-13 01/29/2008  . Pneumococcal Polysaccharide-23 05/05/2014  . Td 06/14/2005  . Zoster 06/11/2006  . Zoster Recombinat (Shingrix) 11/07/2017   Health Maintenance Due  Topic Date Due  . TETANUS/TDAP  06/15/2015    Last colonoscopy: cologuard 08/09/18 Last PSA: 05/05/14 Dentist:Q six mnths Ophtho:  04-29-19 Exercise: qd walking  Other doctors caring for patient include: Dr. Alyson Ingles urology, Dr. Vilma Prader derma  Advanced Directives:Need on file  Does Patient Have a Medical Advance Directive?: No Would patient like information on creating a medical advance directive?: No - Patient declined  Depression screen:  See questionnaire below.     Depression screen [facility] 2/9 04/28/2019 12/12/2017 06/16/2016 05/10/2015 05/10/2015  Decreased Interest 0 0 0 0 0  Down, Depressed, Hopeless 0 0 0 0 0  PHQ - 2 Score 0 0 0 0 0  Altered sleeping - - - - -  Tired, decreased energy - - - - -  PHQ-9 Score - - - - -    Fall Screen: See Questionaire below.   Fall Risk  04/28/2019 06/16/2016 05/10/2015 05/10/2015 05/05/2014  Falls in the past year? 0 No No No No    ADL screen:  See questionnaire below.  Functional Status Survey: Is the patient deaf or have difficulty hearing?: No Does the patient have difficulty seeing, even when wearing glasses/contacts?: No Does the patient have difficulty concentrating, remembering, or making decisions?: No Does the patient have difficulty walking or climbing stairs?: Yes(right knee , right ankle) Does the patient have difficulty dressing or bathing?: No Does the patient have difficulty doing errands alone such as visiting a doctor's office or shopping?: No   Review of Systems  Constitutional: -, -unexpected weight change, -anorexia, -fatigue Allergy: -sneezing, -itching, -congestion Dermatology: denies changing moles, rash, lumps ENT: -runny nose, -ear pain, -  sore throat,  Cardiology:  -chest pain, -palpitations, -orthopnea, Respiratory: -cough, -shortness of breath, -dyspnea on exertion, -wheezing,  Gastroenterology: -abdominal pain, -nausea, -vomiting, -diarrhea, -constipation, -dysphagia Hematology: -bleeding or bruising problems Musculoskeletal: -arthralgias, -myalgias, -joint swelling, -back pain, - Ophthalmology: -vision changes,  Urology:  -dysuria, -difficulty urinating,  -urinary frequency, -urgency, incontinence Neurology: -, -numbness, , -memory loss, -falls, -dizziness    PHYSICAL EXAM:  BP 138/86 (BP Location: Left Arm, Patient Position: Sitting)   Pulse 64   Temp (!) 97.5 F (36.4 C)   Ht 5\' 9"  (1.753 m)   Wt 173 lb (78.5 kg)   SpO2 96%   BMI 25.55 kg/m   General Appearance: Alert, cooperative, no distress, appears stated age Head: Normocephalic, without obvious abnormality, atraumatic Eyes: PERRL, conjunctiva/corneas clear, EOM's intact, fundi benign Ears: Normal TM's and external ear canals Nose: Nares normal, mucosa normal, no drainage or sinus   tenderness Throat: Lips, mucosa, and tongue normal; teeth and gums normal Neck: Supple, no lymphadenopathy, thyroid:no enlargement/tenderness/nodules; no carotid bruit or JVD Lungs: Clear to auscultation bilaterally without wheezes, rales or ronchi; respirations unlabored Heart: Regular rate and rhythm, S1 and S2 normal, no murmur, rub or gallop Abdomen: Soft, non-tender, nondistended, normoactive bowel sounds, no masses, no hepatosplenomegaly Extremities: No clubbing, cyanosis or edema.  The toenails are thickened Pulses: 2+ and symmetric all extremities Skin: Skin color, texture, turgor normal, no rashes or lesions Lymph nodes: Cervical, supraclavicular, and axillary nodes normal Neurologic: CNII-XII intact, normal strength, sensation and gait; reflexes 2+ and symmetric throughout   Psych: Normal mood, affect, hygiene and grooming MMSE 29 ASSESSMENT/PLAN: Routine general medical examination at a health care facility - Plan: CBC with Differential/Platelet, Comprehensive metabolic panel, Lipid panel  HYPERCHOLESTEROLEMIA - Plan: Lipid panel, atorvastatin (LIPITOR) 10 MG tablet  Essential hypertension - Plan: CBC with Differential/Platelet, Comprehensive metabolic panel, amLODipine-olmesartan (AZOR) 5-20 MG tablet  Gastroesophageal reflux disease without  esophagitis      Explained that some of his chest symptoms are really reflux in nature and recommend liquid Maalox or possibly Prilosec. History of prostate cancer: Continue follow-up with urology  Diverticulosis of colon  Onychomycosis - Plan: Ambulatory referral to Podiatry  Cataract of left eye, unspecified cataract type: Continue follow-up with Dr. Katy Fitch  Arthritis: I discussed the need trouble he is having and encouraged him to discuss possible injection and if this is not beneficial then consider surgical intervention.  He was comfortable with that.  I also discussed cutting back on his fluids late at night to help with nocturia. Then discussed using a higher dose of Cialis to see if that will help with his erectile dysfunction.    recommended at least 30 minutes of aerobic activity at least 5 days/week;  healthy diet and alcohol recommendations (less than or equal to 2 drinks/day) reviewed;  Immunization recommendations discussed.  Colonoscopy recommendations reviewed.   Medicare Attestation I have personally reviewed: The patient's medical and social history Their use of alcohol, tobacco or illicit drugs Their current medications and supplements The patient's functional ability including ADLs,fall risks, home safety risks, cognitive, and hearing and visual impairment Diet and physical activities Evidence for depression or mood disorders  The patient's weight, height, and BMI have been recorded in the chart.  I have made referrals, counseling, and provided education to the patient based on review of the above and I have provided the patient with a written personalized care plan for preventive services.     Ernest Alexanders, MD   04/28/2019

## 2019-04-29 LAB — COMPREHENSIVE METABOLIC PANEL
ALT: 17 IU/L (ref 0–44)
AST: 16 IU/L (ref 0–40)
Albumin/Globulin Ratio: 1.4 (ref 1.2–2.2)
Albumin: 4 g/dL (ref 3.7–4.7)
Alkaline Phosphatase: 62 IU/L (ref 39–117)
BUN/Creatinine Ratio: 15 (ref 10–24)
BUN: 14 mg/dL (ref 8–27)
Bilirubin Total: 0.4 mg/dL (ref 0.0–1.2)
CO2: 27 mmol/L (ref 20–29)
Calcium: 9 mg/dL (ref 8.6–10.2)
Chloride: 102 mmol/L (ref 96–106)
Creatinine, Ser: 0.94 mg/dL (ref 0.76–1.27)
GFR calc Af Amer: 93 mL/min/{1.73_m2} (ref >59)
GFR calc non Af Amer: 80 mL/min/{1.73_m2} (ref >59)
Globulin, Total: 2.9 g/dL (ref 1.5–4.5)
Glucose: 88 mg/dL (ref 65–99)
Potassium: 4.8 mmol/L (ref 3.5–5.2)
Sodium: 141 mmol/L (ref 134–144)
Total Protein: 6.9 g/dL (ref 6.0–8.5)

## 2019-04-29 LAB — CBC WITH DIFFERENTIAL/PLATELET
Basophils Absolute: 0 10*3/uL (ref 0.0–0.2)
Basos: 0 %
EOS (ABSOLUTE): 0.1 10*3/uL (ref 0.0–0.4)
Eos: 1 %
Hematocrit: 43 % (ref 37.5–51.0)
Hemoglobin: 14.6 g/dL (ref 13.0–17.7)
Immature Grans (Abs): 0 10*3/uL (ref 0.0–0.1)
Immature Granulocytes: 0 %
Lymphocytes Absolute: 2 10*3/uL (ref 0.7–3.1)
Lymphs: 40 %
MCH: 29.4 pg (ref 26.6–33.0)
MCHC: 34 g/dL (ref 31.5–35.7)
MCV: 87 fL (ref 79–97)
Monocytes Absolute: 0.3 10*3/uL (ref 0.1–0.9)
Monocytes: 7 %
Neutrophils Absolute: 2.6 10*3/uL (ref 1.4–7.0)
Neutrophils: 52 %
Platelets: 183 10*3/uL (ref 150–450)
RBC: 4.97 x10E6/uL (ref 4.14–5.80)
RDW: 13.3 % (ref 11.6–15.4)
WBC: 4.9 10*3/uL (ref 3.4–10.8)

## 2019-04-29 LAB — LIPID PANEL
Chol/HDL Ratio: 2.1 ratio (ref 0.0–5.0)
Cholesterol, Total: 129 mg/dL (ref 100–199)
HDL: 61 mg/dL (ref >39)
LDL Chol Calc (NIH): 54 mg/dL (ref 0–99)
Triglycerides: 67 mg/dL (ref 0–149)
VLDL Cholesterol Cal: 14 mg/dL (ref 5–40)

## 2019-05-12 ENCOUNTER — Encounter: Payer: Self-pay | Admitting: Orthopaedic Surgery

## 2019-05-12 ENCOUNTER — Ambulatory Visit: Payer: Self-pay

## 2019-05-12 ENCOUNTER — Ambulatory Visit (INDEPENDENT_AMBULATORY_CARE_PROVIDER_SITE_OTHER): Payer: Medicare Other | Admitting: Orthopaedic Surgery

## 2019-05-12 DIAGNOSIS — M25561 Pain in right knee: Secondary | ICD-10-CM

## 2019-05-12 DIAGNOSIS — M76821 Posterior tibial tendinitis, right leg: Secondary | ICD-10-CM

## 2019-05-12 DIAGNOSIS — G8929 Other chronic pain: Secondary | ICD-10-CM

## 2019-05-12 DIAGNOSIS — M25571 Pain in right ankle and joints of right foot: Secondary | ICD-10-CM

## 2019-05-12 MED ORDER — METHYLPREDNISOLONE 4 MG PO TABS: ORAL_TABLET | ORAL | 0 refills | Status: DC

## 2019-05-12 NOTE — Progress Notes (Signed)
Office Visit Note   Patient: Ernest Hughes.           Date of Birth: Dec 02, 1945           MRN: [id number] Visit Date: 05/12/2019              Requested by: Denita Lung, MD [place],  [place] [address] PCP: Denita Lung, MD   Assessment & Plan: Visit Diagnoses:  1. Pain in right ankle and joints of right foot   2. Chronic pain of right knee   3. Posterior tibial tendon dysfunction, right     Plan: I did look at his shoes.  He is wearing very soft cushion knee tennis shoes that do not offer any support that he needs.  I recommended firmer shoes with even considering custom inserts.  He would absolutely benefit from outpatient physical therapy for his right ankle for his posterior tibial tendon dysfunction for any modalities that can decrease the swelling and pain and improve the function of the posterior tibial tendon and ankle.  They can also work on strengthening the muscles around the right knee.  Have recommended Voltaren gel.  I have recommended more firm shoe wear.  All question concerns were answered addressed.  We will see him back in 6 weeks to see how is doing overall.  We will work on ordering outpatient physical therapy at [facility] outpatient facility at [place].  Follow-Up Instructions: Return in about 6 weeks (around 06/23/2019).   Orders:  Orders Placed This Encounter  Procedures  . XR Ankle Complete Right   Meds ordered this encounter  Medications  . methylPREDNISolone (MEDROL) 4 MG tablet    Sig: Medrol dose pack. Take as instructed    Dispense:  21 tablet    Refill:  0      Procedures: No procedures performed   Clinical Data: No additional findings.   Subjective: Chief Complaint  Patient presents with  . Right Knee - Follow-up  . Right Ankle - Follow-up  The patient comes in today for continued follow-up of right knee pain.  He has significant cartilage loss in the medial aspect of his knee that was verified on MRI  and there is meniscal tear.  We are still trying to treat him nonoperative and conservative.  He is a very active 73 years old.  He does wear a knee sleeve on that right knee.  He is also been experiencing some right ankle pain and he points the medial aspect of his ankle.  He has a flatfoot deformity on that side and he did see Dr. Sharol Given in the past for this.  He wears an ankle sleeve as well.  He has not been through any type of physical therapy.  We talked about hyaluronic acid for the right knee and he has a handout on that.  He is so we will hold off on any type of injection while we are still pursuing conservative treatment.  He does take an occasional Advil but it does cause reflux so he does alternate that with Tylenol arthritis.  He did get some Voltaren gel but is not use that for the ankle.  He is only use it for the knee but minimal.  HPI  Review of Systems He currently denies any headache, chest pain, shortness of breath, fever, chills, nausea, vomiting  Objective: Vital Signs: There were no vitals taken for this visit.  Physical Exam He is alert and orient  x3 and in no acute distress Ortho Exam Examination of his right knee shows no effusion today.  There is medial joint line tenderness and varus malalignment but good range of motion overall.  His right knee is ligamentously stable.  Examination of his right ankle does show a flatfoot deformity.  There is pain along the course the posterior tibial tendon.  He can do a toe raise on that side she does have at least an intact posterior tibial tendon.  There is probably a degree of dysfunction of the tendon itself. Specialty Comments:  No specialty comments available.  Imaging: Xr Ankle Complete Right  Result Date: 05/12/2019 3 views of the right ankle showed no acute findings.  There is a flatfoot deformity that is easily seen.    PMFS History: Patient Active Problem List   Diagnosis Date Noted  . Cataract of left eye 04/28/2019   . Erectile dysfunction 04/28/2019  . Onychomycosis 05/05/2014  . History of prostate cancer 05/22/2011  . GERD 02/02/2009  . HYPERCHOLESTEROLEMIA 02/01/2009  . Essential hypertension 02/01/2009  . Diverticulosis of colon 02/01/2009   Past Medical History:  Diagnosis Date  . BPH (benign prostatic hyperplasia)   . Cancer ([place])    PROSTATE--treated with radiation  . Diverticulosis   . GERD (gastroesophageal reflux disease)   . Hypertension     Family History  Problem Relation Age of Onset  . Cancer Mother   . Cancer Father   . Heart disease Sister   . Cancer Maternal Aunt   . Cancer Paternal Aunt   . Cancer Paternal Grandfather     Past Surgical History:  Procedure Laterality Date  . BELPHAROPTOSIS REPAIR  12/2013   bilateral  . COLONOSCOPY     Dr. Sharlett Iles   Social History   Occupational History  . Not on file  Tobacco Use  . Smoking status: Never Smoker  . Smokeless tobacco: Never Used  Substance and Sexual Activity  . Alcohol use: Yes    Comment: has some wine once in a while   . Drug use: No  . Sexual activity: Not Currently

## 2019-05-13 ENCOUNTER — Other Ambulatory Visit: Payer: Self-pay

## 2019-05-13 DIAGNOSIS — M25561 Pain in right knee: Secondary | ICD-10-CM

## 2019-05-20 ENCOUNTER — Encounter: Payer: Self-pay | Admitting: Sports Medicine

## 2019-05-20 ENCOUNTER — Other Ambulatory Visit: Payer: Self-pay

## 2019-05-20 ENCOUNTER — Ambulatory Visit: Payer: Medicare Other | Admitting: Sports Medicine

## 2019-05-20 VITALS — BP 157/65

## 2019-05-20 DIAGNOSIS — B351 Tinea unguium: Secondary | ICD-10-CM | POA: Diagnosis not present

## 2019-05-20 DIAGNOSIS — M79675 Pain in left toe(s): Secondary | ICD-10-CM | POA: Diagnosis not present

## 2019-05-20 DIAGNOSIS — M79674 Pain in right toe(s): Secondary | ICD-10-CM

## 2019-05-20 NOTE — Patient Instructions (Signed)
For tennis shoes recommend:  Brooks Beast Ascis New balance Saucony Can be purchased at [employer] or [employer]  Vionic  SAS Can be purchased at [employer] or [employer]   For work shoes recommend: Sketchers Work Timberland boots  Can be purchased at a variety of places or [employer]   For casual shoes recommend: Vionic  Can be purchased at [employer] or [employer]  

## 2019-05-20 NOTE — Progress Notes (Signed)
Subjective: Ernest Hughes. is a 73 y.o. male patient seen today in office with complaint of mildly painful thickened and discolored nails. Patient is desiring treatment for nail changes; has tried OTC topicals/Medication in the past with no improvement. Reports that nails are becoming difficult to manage because of the thickness reports that he is not sure if the way he is trimming his nails are causing them to grow this way also reports that he is looking for recommendations on shoes that could help to protect his right ankle occasionally has pain there but most pain in right knee using Voltaren gel with relief. Patient has no other pedal complaints at this time.  Review of Systems  All other systems reviewed and are negative.    Patient Active Problem List   Diagnosis Date Noted  . Cataract of left eye 04/28/2019  . Erectile dysfunction 04/28/2019  . Onychomycosis 05/05/2014  . History of prostate cancer 05/22/2011  . GERD 02/02/2009  . HYPERCHOLESTEROLEMIA 02/01/2009  . Essential hypertension 02/01/2009  . Diverticulosis of colon 02/01/2009    Current Outpatient Medications on File Prior to Visit  Medication Sig Dispense Refill  . acetaminophen (TYLENOL) 500 MG tablet Take 500 mg by mouth every 6 (six) hours as needed.    Marland Kitchen alum & mag hydroxide-simeth (MAALOX PLUS) 400-400-40 MG/5ML suspension Take 5 mLs by mouth every 6 (six) hours as needed for indigestion.    Marland Kitchen amLODipine-olmesartan (AZOR) 5-20 MG tablet Take 1 tablet by mouth daily. 90 tablet 3  . amoxicillin-clavulanate (AUGMENTIN) 875-125 MG tablet Take 1 tablet by mouth 2 (two) times daily. 20 tablet 0  . aspirin EC 81 MG tablet Take by mouth.    Marland Kitchen atorvastatin (LIPITOR) 10 MG tablet Take 1 tablet (10 mg total) by mouth daily. 30 tablet 3  . carboxymethylcellulose (REFRESH PLUS) 0.5 % SOLN 1 drop 3 (three) times daily as needed.    . clindamycin (CLEOCIN T) 1 % external solution     . clindamycin (CLEOCIN T) 1 % lotion  Apply topically 2 (two) times daily.    . diazepam (VALIUM) 5 MG tablet diazepam 5 mg tablet    . erythromycin ophthalmic ointment erythromycin 5 mg/gram (0.5 %) eye ointment    . fluocinolone (VANOS) 0.01 % cream fluocinolone 0.01 % topical cream  APPLY A THIN LAYER TO THE AFFECTED AREA(S) BY TOPICAL ROUTE 2-4 TIMESDAILY    . fluocinonide cream (LIDEX) [id number] % Apply 1 application topically 2 (two) times daily.    . fluorometholone (FML) 0.1 % ophthalmic suspension 1 drop every 4 (four) hours.    . fluticasone (CUTIVATE) 0.05 % cream     . Fluticasone Propionate 0.05 % LOTN Apply topically.    . Ginkgo Biloba (GINKOBA) 40 MG TABS Take 120 mg by mouth daily.     Marland Kitchen HYDROCORTISONE, TOPICAL, (CORTIZONE-10) 1 % GEL Apply topically. Reported on 01/17/2016    . Ibuprofen (ADVIL PO) Take by mouth.    . Influenza Vac A&B Surf Ant Adj (FLUAD) 0.5 ML SUSY Fluad 2019-20 45yr up(PF)45 mcg(15 mcgx3)/0.5 mL intramuscular syringe    . Ketoconazole 2 % GEL Apply topically. Reported on 01/17/2016    . Menthol, Topical Analgesic, (BENGAY EX) Apply topically.    . Menthol, Topical Analgesic, 1 % AERP Apply topically.    . methylPREDNISolone (MEDROL) 4 MG tablet Medrol dose pack. Take as instructed 21 tablet 0  . naproxen sodium (ALEVE) 220 MG tablet Take 220 mg by mouth.    Marland Kitchen  omeprazole (PRILOSEC) 40 MG capsule TAKE 1 CAPSULE DAILY 90 capsule 3  . Polyethylene Glycol 400 (BLINK TEARS OP) Apply to eye.    . predniSONE (STERAPRED UNI-PAK 21 TAB) 10 MG (21) TBPK tablet Take by mouth daily. 21 tablet 0  . Propylene Glycol (SYSTANE BALANCE) 0.6 % SOLN Apply to eye.    . psyllium (METAMUCIL) 58.6 % powder Take 1 packet by mouth as needed.      . tadalafil (CIALIS) 5 MG tablet Take 5 mg by mouth daily as needed for erectile dysfunction.    . Tamsulosin HCl (FLOMAX) 0.4 MG CAPS Take 0.4 mg by mouth 2 (two) times daily.     . traMADol (ULTRAM) 50 MG tablet Take 1 tablet (50 mg total) by mouth every 8 (eight) hours as needed.  30 tablet 0  . triamcinolone cream (KENALOG) 0.1 % Apply 1 application topically 2 (two) times daily. 30 g 0   No current facility-administered medications on file prior to visit.     No Known Allergies  Objective: Physical Exam  General: Well developed, nourished, no acute distress, awake, alert and oriented x 3  Vascular: Dorsalis pedis artery 1/4 bilateral, Posterior tibial artery 1/4 bilateral, skin temperature warm to warm proximal to distal bilateral lower extremities, no varicosities, pedal hair present bilateral.  Neurological: Gross sensation present via light touch bilateral.   Dermatological: Skin is warm, dry, and supple bilateral, Nails 1-10 are tender, short thick, and discolored with mild subungal debris, no webspace macerations present bilateral, no open lesions present bilateral, no callus/corns/hyperkeratotic tissue present bilateral. No signs of infection bilateral.  Musculoskeletal: No reproducible pain to ankle on right.  Asymptomatic hammertoe boney deformities noted bilateral. Muscular strength within normal limits without painon range of motion. No pain with calf compression bilateral.  Assessment and Plan:  Problem List Items Addressed This Visit    None    Visit Diagnoses    Nail fungus    -  Primary   Relevant Medications   Influenza Vac A&B Surf Ant Adj (FLUAD) 0.5 ML SUSY   Other Relevant Orders   Culture, fungus without smear   Toe pain, bilateral          -Examined patient -Discussed treatment options for painful dystrophic nails  -Fungal culture was obtained by removing a portion of the hard nail itself from each of the involved toenails using a sterile nail nipper and sent to [facility] lab. Patient tolerated the biopsy procedure well without discomfort or need for anesthesia.  -Recommend continue with Voltaren -Advised good supportive shoes and continue with orthopedic follow-up for the knee which could be contributing to ankle symptoms -Patient to  return in 4 weeks for follow up evaluation and discussion of fungal culture results or sooner if symptoms worsen.  Landis Martins, DPM

## 2019-05-26 ENCOUNTER — Ambulatory Visit: Payer: Medicare Other | Attending: Orthopaedic Surgery

## 2019-05-26 ENCOUNTER — Other Ambulatory Visit: Payer: Self-pay

## 2019-05-26 DIAGNOSIS — G8929 Other chronic pain: Secondary | ICD-10-CM

## 2019-05-26 DIAGNOSIS — M25561 Pain in right knee: Secondary | ICD-10-CM | POA: Diagnosis present

## 2019-05-26 DIAGNOSIS — M25571 Pain in right ankle and joints of right foot: Secondary | ICD-10-CM

## 2019-05-26 NOTE — Therapy (Signed)
[address] [place], [place], [address] Phone: [phone]   Fax:  [phone]  Physical Therapy Evaluation  Patient Details  Name: Ernest Hughes. MRN: [id number] Date of Birth: 12/16/1945 Referring Provider (PT): Jean Rosenthal MD   Encounter Date: 05/26/2019  PT End of Session - 05/26/19 1111    Visit Number  1    Number of Visits  13    Date for PT Re-Evaluation  06/23/19    PT Start Time  1020    PT Stop Time  1105    PT Time Calculation (min)  45 min    Activity Tolerance  Patient tolerated treatment well    Behavior During Therapy  [facility] for tasks assessed/performed       Past Medical History:  Diagnosis Date  . BPH (benign prostatic hyperplasia)   . Cancer ([place])    PROSTATE--treated with radiation  . Diverticulosis   . GERD (gastroesophageal reflux disease)   . Hypertension     Past Surgical History:  Procedure Laterality Date  . BELPHAROPTOSIS REPAIR  12/2013   bilateral  . COLONOSCOPY     Dr. Sharlett Iles    There were no vitals filed for this visit.   Subjective Assessment - 05/26/19 1021    Subjective  Pt reports that he has ankle and knee pain. He reports that he wants to try physical therapy before getting arthroscope on his R knee. He states that he is going to the podiatrist and asked her if he had flat feet. He states that she said that he did and needs to get shoes with more support. He reports that he was wearing shoe support but took those out when he started to experience knee pain and bought some new tennis shoes instead.    Pertinent History  prostate cancer, controlled HTN    How long can you stand comfortably?  not long before the ankle starts to bother him.    How long can you walk comfortably?  3-4 miles/day    Diagnostic tests  x-ray with no significant findings in the R ankle and with findings of degenerative changes in the R knee.    Currently in Pain?  Yes     Pain Score  5     Pain Location  Knee    Pain Orientation  Right    Pain Descriptors / Indicators  Aching;Stabbing    Pain Type  Chronic pain    Pain Onset  More than a month ago    Pain Frequency  Intermittent    Aggravating Factors   going up/down hills, stairs    Pain Relieving Factors  Compression, heat    Multiple Pain Sites  Yes    Pain Score  8    Pain Location  Ankle    Pain Orientation  Right    Pain Descriptors / Indicators  Aching    Pain Type  Chronic pain    Pain Onset  More than a month ago    Aggravating Factors   walking/standing    Pain Relieving Factors  rest         [facility] PT Assessment - 05/26/19 0001      Assessment   Medical Diagnosis  R ankle/knee pain    Referring Provider (PT)  Jean Rosenthal MD    Hand Dominance  Left    Next MD Visit  4-6 months     Prior Therapy  not for  this condition      Restrictions   Weight Bearing Restrictions  No      Balance Screen   Has the patient fallen in the past 6 months  No      [place] residence    Living Arrangements  Spouse/significant other    Type of [place] to enter    Entrance Stairs-Number of Steps  5    Entrance Stairs-Rails  Can reach both    Home Layout  Two level    Alternate Level Stairs-Number of Steps  13    Alternate Level Stairs-Rails  Left      Prior Function   Level of [place]  Retired    Leisure  I like to play golf, walk,       Cognition   Overall Cognitive Status  Within Functional Limits for tasks assessed      ROM / Strength   AROM / PROM / Strength  AROM;Strength      AROM   AROM Assessment Site  Knee;Ankle    Right/Left Knee  Right;Left    Right Knee Extension  0    Right Knee Flexion  126    Left Knee Extension  0    Left Knee Flexion  126    Right/Left Ankle  Right;Left    Right Ankle Dorsiflexion  2    Right Ankle Plantar Flexion  32    Right Ankle Inversion   22    Right Ankle Eversion  1    Left Ankle Dorsiflexion  5    Left Ankle Plantar Flexion  35    Left Ankle Inversion  17    Left Ankle Eversion  5      Strength   Strength Assessment Site  Hip;Knee;Ankle    Right/Left Hip  Right;Left    Right Hip Flexion  5/5    Right Hip ABduction  4+/5    Right Hip ADduction  4+/5    Left Hip Flexion  5/5    Left Hip ABduction  4+/5    Left Hip ADduction  4+/5    Right/Left Knee  Right;Left    Right Knee Flexion  5/5    Right Knee Extension  5/5    Left Knee Flexion  5/5    Left Knee Extension  5/5    Right/Left Ankle  Right;Left    Right Ankle Dorsiflexion  4/5    Right Ankle Plantar Flexion  4/5    Right Ankle Inversion  3+/5    Right Ankle Eversion  3/5    Left Ankle Dorsiflexion  5/5    Left Ankle Plantar Flexion  4+/5    Left Ankle Inversion  5/5    Left Ankle Eversion  5/5                Objective measurements completed on examination: See above findings.              PT Education - 05/26/19 1109    Education Details  Pt was educated extensively on shoe type to benefit a non-flexible pes planus. Including unless he has a specially made shoe by an orthotist he should get something that is less aggressive to decrease stress on the R knee and something with a heel cup that helps direct the motion of the foot on the ground. Discussed the effects of the hip/knee  and ankle on each other in standing including discussing the kinetic chain.    Person(s) Educated  Patient    Methods  Explanation;Demonstration;Verbal cues    Comprehension  Verbalized understanding       PT Short Term Goals - 05/26/19 1118      PT SHORT TERM GOAL #1   Title  Pt will demonstrate proper recall with HEP.    Baseline  Pt does not have an HEP    Time  3    Period  Weeks    Status  New    Target Date  06/23/19      PT SHORT TERM GOAL #2   Title  Pt will have appropriate supportive shoes to decrease stress on the R knee and post tib  within 3 weeks.    Baseline  Pt has shoes that are very soft with a lateral wear pattern    Time  3    Period  Weeks    Status  New    Target Date  06/23/19        PT Long Term Goals - 05/26/19 1120      PT LONG TERM GOAL #1   Title  Patient will demonstrate 5/5 strenght in R ankle inversion/eversion and DF to demonstrate improved functional strength    Baseline  R DF: 4/5, inv: 3+/5, Ev: 3/5    Time  6    Period  Weeks    Status  New    Target Date  07/14/19      PT LONG TERM GOAL #2   Title  Pt will report 6/10 pain at the R ankle with ambulation and 3/10 pain in the R knee to demonstrate an improved quality of life.    Baseline  8/10 pain R ankle, 6/10 pain R knee    Time  6    Period  Weeks    Status  New    Target Date  07/14/19      PT LONG TERM GOAL #3   Title  Pt will demonstrate improved ROM in the R ankle to 5 degrees DF, and 5 degrees eversion to demonstrate improved control at the R ankle.    Baseline  0 degrees R DF ROM and 1 degrees R eversion ROM    Time  6    Period  Weeks    Status  New    Target Date  07/14/19             Plan - 05/26/19 1112    Clinical Impression Statement  Pt presents to physical therapy with pain in his R knee and ankle. He demonstrates significant weakness in R inversion/eversion and significant decrease in R ankle ROM compared to L ankle ROM. Pt presents with fixed pes planus as demonstrated by no arch formation with heel raise.    Personal Factors and Comorbidities  Time since onset of injury/illness/exacerbation    Stability/Clinical Decision Making  Stable/Uncomplicated    Clinical Decision Making  Low    Rehab Potential  Excellent    PT Frequency  2x / week    PT Duration  6 weeks    PT Treatment/Interventions  Neuromuscular re-education;Patient/family education;Therapeutic exercise;Gait training;Iontophoresis 4mg /ml Dexamethasone;Ultrasound;Electrical Stimulation;Cryotherapy    PT Next Visit Plan  post tib stretch,  gastroc stretch, hip strengthening, look at pt shoe for adequate support, STM to post tib R knee mobs    Consulted and Agree with Plan of Care  Patient  Patient will benefit from skilled therapeutic intervention in order to improve the following deficits and impairments:  Decreased range of motion, Abnormal gait, Pain, Decreased strength  Visit Diagnosis: Chronic pain of right knee  Pain in right ankle and joints of right foot     Problem List Patient Active Problem List   Diagnosis Date Noted  . Cataract of left eye 04/28/2019  . Erectile dysfunction 04/28/2019  . Onychomycosis 05/05/2014  . History of prostate cancer 05/22/2011  . GERD 02/02/2009  . HYPERCHOLESTEROLEMIA 02/01/2009  . Essential hypertension 02/01/2009  . Diverticulosis of colon 02/01/2009    Ander Purpura, PT 05/26/2019, 11:25 AM  [place] [id number] [place], [place], [address] Phone: [phone]   Fax:  [phone]  Name: Ernest Hughes. MRN: [id number] Date of Birth: Sep 07, 1945

## 2019-06-03 ENCOUNTER — Ambulatory Visit: Payer: Medicare Other | Admitting: Physical Therapy

## 2019-06-03 ENCOUNTER — Encounter: Payer: Self-pay | Admitting: Physical Therapy

## 2019-06-03 ENCOUNTER — Other Ambulatory Visit: Payer: Self-pay

## 2019-06-03 DIAGNOSIS — G8929 Other chronic pain: Secondary | ICD-10-CM

## 2019-06-03 DIAGNOSIS — M25561 Pain in right knee: Secondary | ICD-10-CM

## 2019-06-03 DIAGNOSIS — M25571 Pain in right ankle and joints of right foot: Secondary | ICD-10-CM

## 2019-06-03 NOTE — Therapy (Signed)
[address] [place], [place], [address] Phone: [phone]   Fax:  [phone]  Physical Therapy Treatment  Patient Details  Name: Ernest Hughes. MRN: [id number] Date of Birth: [birth year] Referring Provider (PT): Jean Rosenthal MD   Encounter Date: 06/03/2019  PT End of Session - 06/03/19 0847    Visit Number  2    Number of Visits  13    Date for PT Re-Evaluation  06/23/19    PT Start Time  0800    PT Stop Time  0854    PT Time Calculation (min)  54 min    Activity Tolerance  Patient tolerated treatment well    Behavior During Therapy  [facility] for tasks assessed/performed       Past Medical History:  Diagnosis Date  . BPH (benign prostatic hyperplasia)   . Cancer ([place])    PROSTATE--treated with radiation  . Diverticulosis   . GERD (gastroesophageal reflux disease)   . Hypertension     Past Surgical History:  Procedure Laterality Date  . BELPHAROPTOSIS REPAIR  12/2013   bilateral  . COLONOSCOPY     Dr. Sharlett Iles    There were no vitals filed for this visit.  Subjective Assessment - 06/03/19 0803    Subjective  Pt reports that his ankle is better, still having some constant knee pain    Currently in Pain?  Yes    Pain Score  5     Pain Location  Knee    Pain Orientation  Right                       [facility] Adult PT Treatment/Exercise - 06/03/19 0001      Exercises   Exercises  Knee/Hip      Knee/Hip Exercises: Aerobic   Recumbent Bike  L1 x 3 min     Nustep  L4 x 5 min       Knee/Hip Exercises: Machines for Strengthening   Cybex Leg Press  20# 2x 10      Knee/Hip Exercises: Standing   Heel Raises  Both;20 reps    Forward Step Up  10 reps;Step Height: 6";Hand Hold: 0;Both    Walking with Sports Cord  all directions x3      Knee/Hip Exercises: Seated   Long Arc Quad  Strengthening;Right;2 sets;10 reps    Long Arc Quad Weight  3 lbs.    Hamstring Curl   Right;Strengthening;2 sets;10 reps    Sit to Sand  without UE support;10 reps   more extension on left knee than right knee     Modalities   Modalities  --      Cryotherapy   Number Minutes Cryotherapy  10 Minutes    Cryotherapy Location  Knee    Type of Cryotherapy  Ice pack               PT Short Term Goals - 06/03/19 7793      PT SHORT TERM GOAL #1   Title  Pt will demonstrate proper recall with HEP.    Status  Partially Met      PT SHORT TERM GOAL #2   Title  Pt will have appropriate supportive shoes to decrease stress on the R knee and post tib within 3 weeks.    Status  Partially Met        PT Long Term Goals - 05/26/19 1120  PT LONG TERM GOAL #1   Title  Patient will demonstrate 5/5 strenght in R ankle inversion/eversion and DF to demonstrate improved functional strength    Baseline  R DF: 4/5, inv: 3+/5, Ev: 3/5    Time  6    Period  Weeks    Status  New    Target Date  07/14/19      PT LONG TERM GOAL #2   Title  Pt will report 6/10 pain at the R ankle with ambulation and 3/10 pain in the R knee to demonstrate an improved quality of life.    Baseline  8/10 pain R ankle, 6/10 pain R knee    Time  6    Period  Weeks    Status  New    Target Date  07/14/19      PT LONG TERM GOAL #3   Title  Pt will demonstrate improved ROM in the R ankle to 5 degrees DF, and 5 degrees eversion to demonstrate improved control at the R ankle.    Baseline  0 degrees R DF ROM and 1 degrees R eversion ROM    Time  6    Period  Weeks    Status  New    Target Date  07/14/19            Plan - 06/03/19 0848    Clinical Impression Statement  Pt tolerated an initial progression to TE well. Less intense exercises chosen when isolating R knee. Cues not to compensate with step ups and heel raises. LLE tends to lock out first  and hyperextend more than RLE at the top of the sit to stands. 3/10 pain reported after exercises.    Personal Factors and Comorbidities  Time  since onset of injury/illness/exacerbation    Rehab Potential  Excellent    PT Frequency  2x / week    PT Duration  6 weeks    PT Treatment/Interventions  Neuromuscular re-education;Patient/family education;Therapeutic exercise;Gait training;Iontophoresis 50m/ml Dexamethasone;Ultrasound;Electrical Stimulation;Cryotherapy    PT Next Visit Plan  post tib stretch, gastroc stretch, hip strengthening, look at pt shoe for adequate support, STM to post tib R knee mobs       Patient will benefit from skilled therapeutic intervention in order to improve the following deficits and impairments:  Decreased range of motion, Abnormal gait, Pain, Decreased strength  Visit Diagnosis: Chronic pain of right knee  Pain in right ankle and joints of right foot     Problem List Patient Active Problem List   Diagnosis Date Noted  . Cataract of left eye 04/28/2019  . Erectile dysfunction 04/28/2019  . Onychomycosis 05/05/2014  . History of prostate cancer 05/22/2011  . GERD 02/02/2009  . HYPERCHOLESTEROLEMIA 02/01/2009  . Essential hypertension 02/01/2009  . Diverticulosis of colon 02/01/2009    RScot Jun PTA 06/03/2019, 8:51 AM  [address] [address]: [phone]  Fax:  [phone] Name: JWyeth Hoffer MRN: [id number] of Birth: 510/03/1946

## 2019-06-05 ENCOUNTER — Other Ambulatory Visit: Payer: Self-pay

## 2019-06-05 ENCOUNTER — Ambulatory Visit: Payer: Medicare Other | Admitting: Physical Therapy

## 2019-06-05 DIAGNOSIS — M25571 Pain in right ankle and joints of right foot: Secondary | ICD-10-CM

## 2019-06-05 DIAGNOSIS — M25561 Pain in right knee: Secondary | ICD-10-CM | POA: Diagnosis not present

## 2019-06-05 NOTE — Therapy (Signed)
[address] [place], [place], [address] Phone: [phone]   Fax:  [phone]  Physical Therapy Treatment  Patient Details  Name: Cheng Dec. MRN: [id number] Date of Birth: 1946-06-24 Referring Provider (PT): Jean Rosenthal MD   Encounter Date: 06/05/2019  PT End of Session - 06/05/19 1017    Visit Number  3    Number of Visits  14    PT Start Time  0930    PT Stop Time  1020    PT Time Calculation (min)  50 min       Past Medical History:  Diagnosis Date  . BPH (benign prostatic hyperplasia)   . Cancer ([place])    PROSTATE--treated with radiation  . Diverticulosis   . GERD (gastroesophageal reflux disease)   . Hypertension     Past Surgical History:  Procedure Laterality Date  . BELPHAROPTOSIS REPAIR  12/2013   bilateral  . COLONOSCOPY     Dr. Sharlett Iles    There were no vitals filed for this visit.  Subjective Assessment - 06/05/19 0932    Subjective  "a tad sore after last session"    Currently in Pain?  No/denies                       [facility] Adult PT Treatment/Exercise - 06/05/19 0001      High Level Balance   High Level Balance Comments  SLS with ball throws      Knee/Hip Exercises: Aerobic   Recumbent Bike  L1 x 2 min, L 2 x 58mn    Nustep  L4 x 5 min       Knee/Hip Exercises: Machines for Strengthening   Cybex Leg Press  20# 2x 10      Knee/Hip Exercises: Standing   Walking with Sports Cord  all directions x3      Knee/Hip Exercises: Seated   Long Arc Quad  Strengthening;Right;2 sets;10 reps    Long Arc Quad Weight  3 lbs.    Hamstring Curl  Right;Strengthening;2 sets;10 reps      Cryotherapy   Number Minutes Cryotherapy  10 Minutes    Cryotherapy Location  Knee    Type of Cryotherapy  Ice pack               PT Short Term Goals - 06/03/19 06962     PT SHORT TERM GOAL #1   Title  Pt will demonstrate proper recall with HEP.    Status  Partially Met      PT SHORT TERM GOAL #2   Title  Pt will have appropriate supportive shoes to decrease stress on the R knee and post tib within 3 weeks.    Status  Partially Met        PT Long Term Goals - 06/05/19 1002      PT LONG TERM GOAL #1   Title  Patient will demonstrate 5/5 strenght in R ankle inversion/eversion and DF to demonstrate improved functional strength    Status  Partially Met      PT LONG TERM GOAL #2   Title  Pt will report 6/10 pain at the R ankle with ambulation and 3/10 pain in the R knee to demonstrate an improved quality of life.    Status  Partially Met      PT LONG TERM GOAL #3   Title  Pt will demonstrate improved ROM in the  R ankle to 5 degrees DF, and 5 degrees eversion to demonstrate improved control at the R ankle.    Status  Achieved            Plan - 06/05/19 1013    Clinical Impression Statement  Pt complaint of R quad tightness on nustep. Pt able to stand on RLE for ten seconds and progressed to SLS with ball toss on trampoline. Exercises kept the same as patient was a little sore after last treatment.    Rehab Potential  Excellent    PT Frequency  2x / week    PT Duration  6 weeks    PT Treatment/Interventions  Neuromuscular re-education;Patient/family education;Therapeutic exercise;Gait training;Iontophoresis '4mg'$ /ml Dexamethasone;Ultrasound;Electrical Stimulation;Cryotherapy    PT Next Visit Plan  post tib stretch, gastroc stretch, hip strengthening, look at pt shoe for adequate support, STM to post tib R knee mobs    PT Home Exercise Plan  Standing marching, side steps, LAQ       Patient will benefit from skilled therapeutic intervention in order to improve the following deficits and impairments:  Decreased range of motion, Abnormal gait, Pain, Decreased strength  Visit Diagnosis: Pain in right ankle and joints of right foot     Problem List Patient Active Problem List   Diagnosis Date Noted  . Cataract of left eye  04/28/2019  . Erectile dysfunction 04/28/2019  . Onychomycosis 05/05/2014  . History of prostate cancer 05/22/2011  . GERD 02/02/2009  . HYPERCHOLESTEROLEMIA 02/01/2009  . Essential hypertension 02/01/2009  . Diverticulosis of colon 02/01/2009    Barrett Henle, [place] 06/05/2019, 10:20 AM  [address] [place], [place], [address] Phone: [phone]   Fax:  [phone]  Name: Ramirez Fullbright. MRN: [id number] Date of Birth: [birth year]

## 2019-06-10 ENCOUNTER — Other Ambulatory Visit: Payer: Self-pay

## 2019-06-10 ENCOUNTER — Encounter: Payer: Self-pay | Admitting: Physical Therapy

## 2019-06-10 ENCOUNTER — Ambulatory Visit: Payer: Medicare Other | Admitting: Physical Therapy

## 2019-06-10 DIAGNOSIS — M25561 Pain in right knee: Secondary | ICD-10-CM

## 2019-06-10 DIAGNOSIS — M25571 Pain in right ankle and joints of right foot: Secondary | ICD-10-CM

## 2019-06-10 DIAGNOSIS — G8929 Other chronic pain: Secondary | ICD-10-CM

## 2019-06-10 NOTE — Therapy (Signed)
[address] [place], [place], [address] Phone: [phone]   Fax:  [phone]  Physical Therapy Treatment  Patient Details  Name: Ernest Hughes. MRN: [id number] Date of Birth: 1946-01-14 Referring Provider (PT): Jean Rosenthal MD   Encounter Date: 06/10/2019  PT End of Session - 06/10/19 0933    Visit Number  4    Number of Visits  14    Date for PT Re-Evaluation  06/23/19    PT Start Time  0845    PT Stop Time  0942    PT Time Calculation (min)  57 min    Activity Tolerance  Patient tolerated treatment well    Behavior During Therapy  [facility] for tasks assessed/performed       Past Medical History:  Diagnosis Date  . BPH (benign prostatic hyperplasia)   . Cancer ([place])    PROSTATE--treated with radiation  . Diverticulosis   . GERD (gastroesophageal reflux disease)   . Hypertension     Past Surgical History:  Procedure Laterality Date  . BELPHAROPTOSIS REPAIR  12/2013   bilateral  . COLONOSCOPY     Dr. Sharlett Iles    There were no vitals filed for this visit.  Subjective Assessment - 06/10/19 0848    Subjective  "Doing pretty good"    Pertinent History  prostate cancer, controlled HTN    Currently in Pain?  No/denies                       [facility] Adult PT Treatment/Exercise - 06/10/19 0001      High Level Balance   High Level Balance Comments  SLS with rebound ball toss 2x10 some CGA       Neuro Re-ed    Neuro Re-ed Details   LLE BOSU step ups 2x10   Some UE assist neeed a times      Knee/Hip Exercises: Aerobic   Recumbent Bike  L 2 x 3 min     Nustep  L4 x 5 min       Knee/Hip Exercises: Machines for Strengthening   Cybex Knee Extension  5lb 2x10     Cybex Knee Flexion  20lb 2x10     Cybex Leg Press  20# 2x15      Knee/Hip Exercises: Standing   Heel Raises  Both;2 sets;10 reps;2 seconds    Lateral Step Up  Both;1 set;10 reps;Hand Hold: 0;Step Height: 6"     Forward Step Up  10 reps;Step Height: 6";Hand Hold: 0;Right      Knee/Hip Exercises: Seated   Long Arc Quad  Strengthening;Right;2 sets;10 reps   2 second hold with second set    [employer] Weight  3 lbs.    Hamstring Curl  Right;Strengthening;2 sets;10 reps    Hamstring Limitations  green Tband      Cryotherapy   Number Minutes Cryotherapy  10 Minutes    Cryotherapy Location  Knee    Type of Cryotherapy  Ice pack               PT Short Term Goals - 06/03/19 6237      PT SHORT TERM GOAL #1   Title  Pt will demonstrate proper recall with HEP.    Status  Partially Met      PT SHORT TERM GOAL #2   Title  Pt will have appropriate supportive shoes to decrease stress on the R knee and  post tib within 3 weeks.    Status  Partially Met        PT Long Term Goals - 06/10/19 0856      PT LONG TERM GOAL #3   Title  Pt will demonstrate improved ROM in the R ankle to 5 degrees DF, and 5 degrees eversion to demonstrate improved control at the R ankle.    Status  Achieved            Plan - 06/10/19 0934    Clinical Impression Statement  Pt did really well with a progressed treatment session. No reports of increase pain. He does report that's he could feel his knee with recumbent bike warm up. CGA needed with BOSU step ups and SLS with rebound ball toss. Good concentric and eccentric control with step ups.    Stability/Clinical Decision Making  Stable/Uncomplicated    Rehab Potential  Excellent    PT Frequency  2x / week    PT Duration  6 weeks    PT Treatment/Interventions  Neuromuscular re-education;Patient/family education;Therapeutic exercise;Gait training;Iontophoresis 72m/ml Dexamethasone;Ultrasound;Electrical Stimulation;Cryotherapy    PT Next Visit Plan  post tib stretch, gastroc stretch, hip strengthening, look at pt shoe for adequate support, STM to post tib R knee mobs       Patient will benefit from skilled therapeutic intervention in order to improve the  following deficits and impairments:  Decreased range of motion, Abnormal gait, Pain, Decreased strength  Visit Diagnosis: Pain in right ankle and joints of right foot  Chronic pain of right knee     Problem List Patient Active Problem List   Diagnosis Date Noted  . Cataract of left eye 04/28/2019  . Erectile dysfunction 04/28/2019  . Onychomycosis 05/05/2014  . History of prostate cancer 05/22/2011  . GERD 02/02/2009  . HYPERCHOLESTEROLEMIA 02/01/2009  . Essential hypertension 02/01/2009  . Diverticulosis of colon 02/01/2009    RScot Jun PTA 06/10/2019, 9:36 AM  [address] [place] [address]Phone: [phone]  Fax:  [phone] Name: Ernest Hughes MRN: [id number] of Birth: 5September 11, 1947

## 2019-06-12 ENCOUNTER — Encounter: Payer: Self-pay | Admitting: Physical Therapy

## 2019-06-12 ENCOUNTER — Ambulatory Visit: Payer: Medicare Other | Admitting: Physical Therapy

## 2019-06-12 ENCOUNTER — Other Ambulatory Visit: Payer: Self-pay

## 2019-06-12 DIAGNOSIS — M25571 Pain in right ankle and joints of right foot: Secondary | ICD-10-CM

## 2019-06-12 DIAGNOSIS — M25561 Pain in right knee: Secondary | ICD-10-CM

## 2019-06-12 DIAGNOSIS — G8929 Other chronic pain: Secondary | ICD-10-CM

## 2019-06-12 NOTE — Therapy (Signed)
[address] [place], [place], [address] Phone: [phone]   Fax:  [phone]  Physical Therapy Treatment  Patient Details  Name: Ernest Hughes. MRN: [id number] Date of Birth: 1946-01-20 Referring Provider (PT): Jean Rosenthal MD   Encounter Date: 06/12/2019  PT End of Session - 06/12/19 0928    Visit Number  5    Date for PT Re-Evaluation  06/23/19    PT Start Time  0845    PT Stop Time  0937    PT Time Calculation (min)  52 min    Activity Tolerance  Patient tolerated treatment well    Behavior During Therapy  [facility] for tasks assessed/performed       Past Medical History:  Diagnosis Date  . BPH (benign prostatic hyperplasia)   . Cancer ([place])    PROSTATE--treated with radiation  . Diverticulosis   . GERD (gastroesophageal reflux disease)   . Hypertension     Past Surgical History:  Procedure Laterality Date  . BELPHAROPTOSIS REPAIR  12/2013   bilateral  . COLONOSCOPY     Dr. Sharlett Iles    There were no vitals filed for this visit.  Subjective Assessment - 06/12/19 0846    Subjective  "The knee seems to be feeling ok, I didn't put some if that stuff on it"    Pertinent History  prostate cancer, controlled HTN    Currently in Pain?  No/denies         [facility] PT Assessment - 06/12/19 0001      Strength   Right Ankle Inversion  5/5    Right Ankle Eversion  5/5                   [facility] Adult PT Treatment/Exercise - 06/12/19 0001      Knee/Hip Exercises: Aerobic   Recumbent Bike  L 2 x 3 min     Nustep  L4 x 5 min       Knee/Hip Exercises: Machines for Strengthening   Cybex Knee Extension  10lb 2x10     Cybex Knee Flexion  20lb 2x10     Cybex Leg Press  30# 2x10      Knee/Hip Exercises: Standing   Heel Raises  Both;2 sets;10 reps;2 seconds    Forward Step Up  10 reps;Hand Hold: 0;Right;Step Height: 8"    Walking with Sports Cord  resisted side step over foam  roll 5lb x 3 each, 10lb x 2 each       Knee/Hip Exercises: Seated   Long Arc Quad  Strengthening;Right;2 sets;10 reps    Long Arc Quad Weight  5 lbs.    Hamstring Curl  Right;Strengthening;2 sets;10 reps    Hamstring Limitations  blue Tband      Cryotherapy   Number Minutes Cryotherapy  10 Minutes    Cryotherapy Location  Knee    Type of Cryotherapy  Ice pack               PT Short Term Goals - 06/12/19 0857      PT SHORT TERM GOAL #1   Title  Pt will demonstrate proper recall with HEP.    Status  Achieved      PT SHORT TERM GOAL #2   Title  Pt will have appropriate supportive shoes to decrease stress on the R knee and post tib within 3 weeks.    Status  Achieved  PT Long Term Goals - 06/12/19 0857      PT LONG TERM GOAL #1   Title  Patient will demonstrate 5/5 strenght in R ankle inversion/eversion and DF to demonstrate improved functional strength    Status  Achieved      PT LONG TERM GOAL #2   Title  Pt will report 6/10 pain at the R ankle with ambulation and 3/10 pain in the R knee to demonstrate an improved quality of life.    Status  Partially Met      PT LONG TERM GOAL #3   Title  Pt will demonstrate improved ROM in the R ankle to 5 degrees DF, and 5 degrees eversion to demonstrate improved control at the R ankle.    Status  Achieved            Plan - 06/12/19 0929    Clinical Impression Statement  Pt has progressed completing some goals with increase R ankle strength. No reports of  pain throughout session. Pt was able to increase step height with forward step ups. Cues to lower center of gravity with resisted side step in order to maintain good control.    Personal Factors and Comorbidities  Time since onset of injury/illness/exacerbation    Stability/Clinical Decision Making  Stable/Uncomplicated    Rehab Potential  Excellent    PT Duration  6 weeks    PT Treatment/Interventions  Neuromuscular re-education;Patient/family  education;Therapeutic exercise;Gait training;Iontophoresis 56m/ml Dexamethasone;Ultrasound;Electrical Stimulation;Cryotherapy    PT Next Visit Plan  post tib stretch, gastroc stretch, hip strengthening, look at pt shoe for adequate support, STM to post tib R knee mobs       Patient will benefit from skilled therapeutic intervention in order to improve the following deficits and impairments:  Decreased range of motion, Abnormal gait, Pain, Decreased strength  Visit Diagnosis: Pain in right ankle and joints of right foot  Chronic pain of right knee     Problem List Patient Active Problem List   Diagnosis Date Noted  . Cataract of left eye 04/28/2019  . Erectile dysfunction 04/28/2019  . Onychomycosis 05/05/2014  . History of prostate cancer 05/22/2011  . GERD 02/02/2009  . HYPERCHOLESTEROLEMIA 02/01/2009  . Essential hypertension 02/01/2009  . Diverticulosis of colon 02/01/2009    RScot Jun PTA 06/12/2019, 9:34 AM  [address] [address]Phone: [phone]  Fax:  [phone] Name: Ernest Hughes MRN: [id number] of Birth: [birth year]

## 2019-06-17 ENCOUNTER — Ambulatory Visit: Payer: Medicare Other | Admitting: Physical Therapy

## 2019-06-17 ENCOUNTER — Ambulatory Visit: Payer: Medicare Other | Attending: Orthopaedic Surgery | Admitting: Physical Therapy

## 2019-06-17 ENCOUNTER — Other Ambulatory Visit: Payer: Self-pay

## 2019-06-17 DIAGNOSIS — M25561 Pain in right knee: Secondary | ICD-10-CM | POA: Diagnosis present

## 2019-06-17 DIAGNOSIS — M25571 Pain in right ankle and joints of right foot: Secondary | ICD-10-CM | POA: Insufficient documentation

## 2019-06-17 DIAGNOSIS — G8929 Other chronic pain: Secondary | ICD-10-CM

## 2019-06-17 NOTE — Therapy (Signed)
[address], [place], [address] Phone: [phone]   Fax:  [phone]  Physical Therapy Treatment  Patient Details  Name: Ernest Hughes. MRN: [id number] Date of Birth: [birth year] Referring Provider (PT): Jean Rosenthal MD   Encounter Date: 06/17/2019  PT End of Session - 06/17/19 1615    Visit Number  6    Number of Visits  14    Date for PT Re-Evaluation  06/23/19    PT Start Time  2774    PT Stop Time  1620    PT Time Calculation (min)  50 min       Past Medical History:  Diagnosis Date  . BPH (benign prostatic hyperplasia)   . Cancer ([place])    PROSTATE--treated with radiation  . Diverticulosis   . GERD (gastroesophageal reflux disease)   . Hypertension     Past Surgical History:  Procedure Laterality Date  . BELPHAROPTOSIS REPAIR  12/2013   bilateral  . COLONOSCOPY     Dr. Sharlett Iles    There were no vitals filed for this visit.  Subjective Assessment - 06/17/19 1534    Subjective  worked this morning so have not stretched. overall better, some pain at times , definately more flexiable.    Currently in Pain?  No/denies                       [facility] Adult PT Treatment/Exercise - 06/17/19 0001      Knee/Hip Exercises: Aerobic   Elliptical  2 fwd/2 back I 8 R 5    Nustep  L 5 5 min   LE only     Knee/Hip Exercises: Machines for Strengthening   Cybex Knee Extension  15# 2 sets 10    Cybex Knee Flexion  25# 2 sets 10    Cybex Leg Press  30# 3x10, changing feet position   calf raises 30# 2 sets 15     Knee/Hip Exercises: Standing   Heel Raises  Both;2 sets;10 reps;2 seconds    Lateral Step Up  Both;10 reps;Hand Hold: 2   BOSU opp leg abd   Forward Step Up  Both;10 reps;Hand Hold: 2   BOSU opp leg ext   Wall Squat  2 sets;5 reps;5 seconds    Other Standing Knee Exercises  rocker board DF/PF 20, 15 squats      Cryotherapy   Number Minutes  Cryotherapy  10 Minutes    Cryotherapy Location  Knee    Type of Cryotherapy  Ice pack               PT Short Term Goals - 06/12/19 0857      PT SHORT TERM GOAL #1   Title  Pt will demonstrate proper recall with HEP.    Status  Achieved      PT SHORT TERM GOAL #2   Title  Pt will have appropriate supportive shoes to decrease stress on the R knee and post tib within 3 weeks.    Status  Achieved        PT Long Term Goals - 06/12/19 0857      PT LONG TERM GOAL #1   Title  Patient will demonstrate 5/5 strenght in R ankle inversion/eversion and DF to demonstrate improved functional strength    Status  Achieved      PT LONG TERM GOAL #2   Title  Pt will report 6/10  pain at the R ankle with ambulation and 3/10 pain in the R knee to demonstrate an improved quality of life.    Status  Partially Met      PT LONG TERM GOAL #3   Title  Pt will demonstrate improved ROM in the R ankle to 5 degrees DF, and 5 degrees eversion to demonstrate improved control at the R ankle.    Status  Achieved            Plan - 06/17/19 1616    Clinical Impression Statement  increased some wt and added some ther ex and pain increased to 6/10 from coming in painfree. some cuing needed with ex tech, but good ROM and strength throughout ROM    PT Treatment/Interventions  Neuromuscular re-education;Patient/family education;Therapeutic exercise;Gait training;Iontophoresis '4mg'$ /ml Dexamethasone;Ultrasound;Electrical Stimulation;Cryotherapy    PT Next Visit Plan  hip strengthening, look at pt shoe for adequate support, STM to post tib R knee mobs       Patient will benefit from skilled therapeutic intervention in order to improve the following deficits and impairments:  Decreased range of motion, Abnormal gait, Pain, Decreased strength  Visit Diagnosis: Pain in right ankle and joints of right foot  Chronic pain of right knee     Problem List Patient Active Problem List   Diagnosis Date Noted   . Cataract of left eye 04/28/2019  . Erectile dysfunction 04/28/2019  . Onychomycosis 05/05/2014  . History of prostate cancer 05/22/2011  . GERD 02/02/2009  . HYPERCHOLESTEROLEMIA 02/01/2009  . Essential hypertension 02/01/2009  . Diverticulosis of colon 02/01/2009    PAYSEUR,ANGIE PTA 06/17/2019, 4:19 PM  [address] [place], [place], [address] Phone: [phone]   Fax:  [phone]  Name: Quintell Bonnin. MRN: [id number] Date of Birth: May 04, 1946

## 2019-06-19 ENCOUNTER — Encounter: Payer: Medicare Other | Admitting: Physical Therapy

## 2019-06-19 ENCOUNTER — Ambulatory Visit: Payer: Medicare Other | Admitting: Physical Therapy

## 2019-06-19 ENCOUNTER — Encounter: Payer: Self-pay | Admitting: Physical Therapy

## 2019-06-19 ENCOUNTER — Other Ambulatory Visit: Payer: Self-pay

## 2019-06-19 DIAGNOSIS — M25571 Pain in right ankle and joints of right foot: Secondary | ICD-10-CM

## 2019-06-19 DIAGNOSIS — G8929 Other chronic pain: Secondary | ICD-10-CM

## 2019-06-19 NOTE — Therapy (Signed)
[address] [place], [place], [address] Phone: [phone]   Fax:  [phone]  Physical Therapy Treatment  Patient Details  Name: Ernest Hughes. MRN: [id number] Date of Birth: 1945/09/10 Referring Provider (PT): Jean Rosenthal MD   Encounter Date: 06/19/2019  PT End of Session - 06/19/19 1009    Visit Number  7    Date for PT Re-Evaluation  06/23/19    PT Start Time  0930    PT Stop Time  1019    PT Time Calculation (min)  49 min    Activity Tolerance  Patient tolerated treatment well    Behavior During Therapy  [facility] for tasks assessed/performed       Past Medical History:  Diagnosis Date  . BPH (benign prostatic hyperplasia)   . Cancer ([place])    PROSTATE--treated with radiation  . Diverticulosis   . GERD (gastroesophageal reflux disease)   . Hypertension     Past Surgical History:  Procedure Laterality Date  . BELPHAROPTOSIS REPAIR  12/2013   bilateral  . COLONOSCOPY     Dr. Sharlett Iles    There were no vitals filed for this visit.  Subjective Assessment - 06/19/19 0933    Subjective  "I am doing pretty good, I haven't walked this morning, so I haven't stretched myself"    Pertinent History  prostate cancer, controlled HTN    Currently in Pain?  No/denies    Pain Score  0-No pain                       [facility] Adult PT Treatment/Exercise - 06/19/19 0001      Ambulation/Gait   Stairs  Yes    Stairs Assistance  6: Modified independent (Device/Increase time)    Stair Management Technique  No rails;One rail Right;Alternating pattern    Number of Stairs  48    Height of Stairs  6      Knee/Hip Exercises: Aerobic   Recumbent Bike  L 2 x 3 min     Nustep  L 5 5 min      Knee/Hip Exercises: Machines for Strengthening   Cybex Knee Extension  15# 2 sets 10    Cybex Knee Flexion  25# 2 sets 10    Cybex Leg Press  30lb 3x10       Knee/Hip Exercises: Standing   Heel  Raises  Both;2 sets;2 seconds;15 reps    Lateral Step Up  10 reps;Hand Hold: 2;Right      Cryotherapy   Number Minutes Cryotherapy  10 Minutes    Cryotherapy Location  Knee    Type of Cryotherapy  Ice pack               PT Short Term Goals - 06/12/19 0857      PT SHORT TERM GOAL #1   Title  Pt will demonstrate proper recall with HEP.    Status  Achieved      PT SHORT TERM GOAL #2   Title  Pt will have appropriate supportive shoes to decrease stress on the R knee and post tib within 3 weeks.    Status  Achieved        PT Long Term Goals - 06/19/19 2947      PT LONG TERM GOAL #2   Title  Pt will report 6/10 pain at the R ankle with ambulation and 3/10 pain in the R knee  to demonstrate an improved quality of life.    Status  Partially Met      PT LONG TERM GOAL #3   Title  Pt will demonstrate improved ROM in the R ankle to 5 degrees DF, and 5 degrees eversion to demonstrate improved control at the R ankle.    Status  Achieved            Plan - 06/19/19 1009    Clinical Impression Statement  no issue with today's interventions evident by no subjective reports of increase pain. He reports an occasional catch throughout the day that causes some pain. No issues with stair negotiation only fatigue. Muscle burning with SL extensions.    Personal Factors and Comorbidities  Time since onset of injury/illness/exacerbation    Stability/Clinical Decision Making  Stable/Uncomplicated    Rehab Potential  Excellent    PT Frequency  2x / week    PT Treatment/Interventions  Neuromuscular re-education;Patient/family education;Therapeutic exercise;Gait training;Iontophoresis 39m/ml Dexamethasone;Ultrasound;Electrical Stimulation;Cryotherapy    PT Next Visit Plan  hip strengthening, look at pt shoe for adequate support, STM to post tib R knee mobs       Patient will benefit from skilled therapeutic intervention in order to improve the following deficits and impairments:   Decreased range of motion, Abnormal gait, Pain, Decreased strength  Visit Diagnosis: Pain in right ankle and joints of right foot  Chronic pain of right knee     Problem List Patient Active Problem List   Diagnosis Date Noted  . Cataract of left eye 04/28/2019  . Erectile dysfunction 04/28/2019  . Onychomycosis 05/05/2014  . History of prostate cancer 05/22/2011  . GERD 02/02/2009  . HYPERCHOLESTEROLEMIA 02/01/2009  . Essential hypertension 02/01/2009  . Diverticulosis of colon 02/01/2009    RScot Jun PTA 06/19/2019, 10:12 AM  [address] [address]Phone: [phone]  Fax:  [phone] Name: JLinkin Vizzini MRN: [id number] of Birth: 503/16/1947

## 2019-06-24 ENCOUNTER — Ambulatory Visit: Payer: Medicare Other | Admitting: Sports Medicine

## 2019-06-24 ENCOUNTER — Other Ambulatory Visit: Payer: Self-pay

## 2019-06-24 ENCOUNTER — Encounter: Payer: Self-pay | Admitting: Physical Therapy

## 2019-06-24 ENCOUNTER — Ambulatory Visit: Payer: Medicare Other | Admitting: Physical Therapy

## 2019-06-24 ENCOUNTER — Encounter: Payer: Self-pay | Admitting: Sports Medicine

## 2019-06-24 DIAGNOSIS — M79675 Pain in left toe(s): Secondary | ICD-10-CM | POA: Diagnosis not present

## 2019-06-24 DIAGNOSIS — G8929 Other chronic pain: Secondary | ICD-10-CM

## 2019-06-24 DIAGNOSIS — B351 Tinea unguium: Secondary | ICD-10-CM

## 2019-06-24 DIAGNOSIS — M79674 Pain in right toe(s): Secondary | ICD-10-CM

## 2019-06-24 DIAGNOSIS — M25571 Pain in right ankle and joints of right foot: Secondary | ICD-10-CM | POA: Diagnosis not present

## 2019-06-24 NOTE — Progress Notes (Signed)
Subjective: Ernest Hughes. is a 73 y.o. male patient seen today in office for fungal culture results. Patient has no other pedal complaints at this time.   Patient Active Problem List   Diagnosis Date Noted  . Cataract of left eye 04/28/2019  . Erectile dysfunction 04/28/2019  . Onychomycosis 05/05/2014  . History of prostate cancer 05/22/2011  . GERD 02/02/2009  . HYPERCHOLESTEROLEMIA 02/01/2009  . Essential hypertension 02/01/2009  . Diverticulosis of colon 02/01/2009    Current Outpatient Medications on File Prior to Visit  Medication Sig Dispense Refill  . acetaminophen (TYLENOL) 500 MG tablet Take 500 mg by mouth every 6 (six) hours as needed.    Marland Kitchen alum & mag hydroxide-simeth (MAALOX PLUS) 400-400-40 MG/5ML suspension Take 5 mLs by mouth every 6 (six) hours as needed for indigestion.    Marland Kitchen amLODipine-olmesartan (AZOR) 5-20 MG tablet Take 1 tablet by mouth daily. 90 tablet 3  . atorvastatin (LIPITOR) 10 MG tablet Take 1 tablet (10 mg total) by mouth daily. 30 tablet 3  . carboxymethylcellulose (REFRESH PLUS) 0.5 % SOLN 1 drop 3 (three) times daily as needed.    . clindamycin (CLEOCIN T) 1 % external solution     . clindamycin (CLEOCIN T) 1 % lotion Apply topically 2 (two) times daily.    . diazepam (VALIUM) 5 MG tablet diazepam 5 mg tablet    . diclofenac sodium (VOLTAREN) 1 % GEL Apply topically 4 (four) times daily.    Marland Kitchen erythromycin ophthalmic ointment erythromycin 5 mg/gram (0.5 %) eye ointment    . fluocinolone (VANOS) 0.01 % cream fluocinolone 0.01 % topical cream  APPLY A THIN LAYER TO THE AFFECTED AREA(S) BY TOPICAL ROUTE 2-4 TIMESDAILY    . fluocinonide cream (LIDEX) [id number] % Apply 1 application topically 2 (two) times daily.    . fluorometholone (FML) 0.1 % ophthalmic suspension 1 drop every 4 (four) hours.    . fluticasone (CUTIVATE) 0.05 % cream     . Fluticasone Propionate 0.05 % LOTN Apply topically.    . Ginkgo Biloba (GINKOBA) 40 MG TABS Take 120 mg by mouth  daily.     Marland Kitchen HYDROCORTISONE, TOPICAL, (CORTIZONE-10) 1 % GEL Apply topically. Reported on 01/17/2016    . Ibuprofen (ADVIL PO) Take by mouth.    . Influenza Vac A&B Surf Ant Adj (FLUAD) 0.5 ML SUSY Fluad 2019-20 61yr up(PF)45 mcg(15 mcgx3)/0.5 mL intramuscular syringe    . Ketoconazole 2 % GEL Apply topically. Reported on 01/17/2016    . Menthol, Topical Analgesic, (BENGAY EX) Apply topically.    . Menthol, Topical Analgesic, 1 % AERP Apply topically.    . naproxen sodium (ALEVE) 220 MG tablet Take 220 mg by mouth.    Marland Kitchen omeprazole (PRILOSEC) 40 MG capsule TAKE 1 CAPSULE DAILY 90 capsule 3  . Polyethylene Glycol 400 (BLINK TEARS OP) Apply to eye.    Marland Kitchen Propylene Glycol (SYSTANE BALANCE) 0.6 % SOLN Apply to eye.    . psyllium (METAMUCIL) 58.6 % powder Take 1 packet by mouth as needed.      . tadalafil (CIALIS) 5 MG tablet Take 5 mg by mouth daily as needed for erectile dysfunction.    . Tamsulosin HCl (FLOMAX) 0.4 MG CAPS Take 0.4 mg by mouth 2 (two) times daily.     . traMADol (ULTRAM) 50 MG tablet Take 1 tablet (50 mg total) by mouth every 8 (eight) hours as needed. 30 tablet 0  . triamcinolone cream (KENALOG) 0.1 % Apply 1 application topically  2 (two) times daily. 30 g 0   No current facility-administered medications on file prior to visit.     No Known Allergies  Objective: Physical Exam  General: Well developed, nourished, no acute distress, awake, alert and oriented x 3  Vascular: Dorsalis pedis artery 2/4 bilateral, Posterior tibial artery 1/4 bilateral, skin temperature warm to warm proximal to distal bilateral lower extremities, no varicosities, pedal hair present bilateral.  Neurological: Gross sensation present via light touch bilateral.   Dermatological: Skin is warm, dry, and supple bilateral, Nails 1-10 are tender, short thick, and discolored with mild subungal debris, no webspace macerations present bilateral, no open lesions present bilateral, no callus/corns/hyperkeratotic  tissue present bilateral. No signs of infection bilateral.  Musculoskeletal: Long 2nd toe and bunion and pes planus boney deformities noted bilateral. Muscular strength within normal limits without painon range of motion. No pain with calf compression bilateral.  Fungal culture- + T Rubrum   Assessment and Plan:  Problem List Items Addressed This Visit    None    Visit Diagnoses    Nail fungus    -  Primary   Toe pain, bilateral         -Examined patient -Discussed treatment options for painful mycotic nails -Patient wants to think about options and will call us back once he decides if he wants Lamisil, Laser, or Topical compound -Patient as scheduled for follow   Landis Martins, DPM

## 2019-06-24 NOTE — Therapy (Signed)
[address] [place], [place], [address] Phone: [phone]   Fax:  [phone]  Physical Therapy Treatment  Patient Details  Name: Ernest Hughes. MRN: [id number] Date of Birth: 19-May-1946 Referring Provider (PT): Ernest Rosenthal MD   Encounter Date: 06/24/2019  PT End of Session - 06/24/19 1348    Visit Number  8    Date for PT Re-Evaluation  06/23/19    PT Start Time  1300    PT Stop Time  1353    PT Time Calculation (min)  53 min    Activity Tolerance  Patient tolerated treatment well    Behavior During Therapy  [facility] for tasks assessed/performed       Past Medical History:  Diagnosis Date  . BPH (benign prostatic hyperplasia)   . Cancer ([place])    PROSTATE--treated with radiation  . Diverticulosis   . GERD (gastroesophageal reflux disease)   . Hypertension     Past Surgical History:  Procedure Laterality Date  . BELPHAROPTOSIS REPAIR  12/2013   bilateral  . COLONOSCOPY     Dr. Sharlett Hughes    There were no vitals filed for this visit.  Subjective Assessment - 06/24/19 1300    Subjective  "Coming alone ok"    Currently in Pain?  No/denies    Pain Score  0-No pain                       [facility] Adult PT Treatment/Exercise - 06/24/19 0001      Ambulation/Gait   Stairs  Yes    Stairs Assistance  6: Modified independent (Device/Increase time)    Stair Management Technique  No rails;One rail Right;Alternating pattern    Number of Stairs  48    Height of Stairs  6      Knee/Hip Exercises: Aerobic   Recumbent Bike  L 2 x 3 min     Nustep  L 4 5 min      Knee/Hip Exercises: Machines for Strengthening   Cybex Knee Extension  10# 2 sets 15    Cybex Knee Flexion  25# 2 sets 10    Cybex Leg Press  30lb 3x10, RLE 20lb 3x5        Knee/Hip Exercises: Standing   Lateral Step Up  10 reps;Hand Hold: 2;Both    Walking with Sports Cord  40lb 4 way x 2     Other Standing Knee  Exercises  Hip abd/add & Ext 3lb 2x10       Cryotherapy   Number Minutes Cryotherapy  10 Minutes    Cryotherapy Location  Knee    Type of Cryotherapy  Ice pack               PT Short Term Goals - 06/12/19 0857      PT SHORT TERM GOAL #1   Title  Pt will demonstrate proper recall with HEP.    Status  Achieved      PT SHORT TERM GOAL #2   Title  Pt will have appropriate supportive shoes to decrease stress on the R knee and post tib within 3 weeks.    Status  Achieved        PT Long Term Goals - 06/24/19 1325      PT LONG TERM GOAL #2   Title  Pt will report 6/10 pain at the R ankle with ambulation and 3/10 pain in the R  knee to demonstrate an improved quality of life.    Status  Partially Met            Plan - 06/24/19 1349    Clinical Impression Statement  Pt continues to do well overall. Continues with some functional interventions without issue. Progressed to some SL strengthening on leg press.Increase some reps with seated leg extensions.    Personal Factors and Comorbidities  Time since onset of injury/illness/exacerbation    Stability/Clinical Decision Making  Stable/Uncomplicated    Rehab Potential  Excellent    PT Frequency  2x / week    PT Treatment/Interventions  Neuromuscular re-education;Patient/family education;Therapeutic exercise;Gait training;Iontophoresis 68m/ml Dexamethasone;Ultrasound;Electrical Stimulation;Cryotherapy       Patient will benefit from skilled therapeutic intervention in order to improve the following deficits and impairments:  Decreased range of motion, Abnormal gait, Pain, Decreased strength  Visit Diagnosis: Pain in right ankle and joints of right foot  Chronic pain of right knee     Problem List Patient Active Problem List   Diagnosis Date Noted  . Cataract of left eye 04/28/2019  . Erectile dysfunction 04/28/2019  . Onychomycosis 05/05/2014  . History of prostate cancer 05/22/2011  . GERD 02/02/2009  .  HYPERCHOLESTEROLEMIA 02/01/2009  . Essential hypertension 02/01/2009  . Diverticulosis of colon 02/01/2009    RScot Jun PTA 06/24/2019, 1:52 PM  [address] [address]Phone: [phone]  Fax:  [phone] Name: [name] MRN: [id number] of Birth: [birth year]

## 2019-06-26 ENCOUNTER — Other Ambulatory Visit: Payer: Self-pay

## 2019-06-26 ENCOUNTER — Ambulatory Visit: Payer: Medicare Other | Admitting: Physical Therapy

## 2019-06-26 DIAGNOSIS — M25571 Pain in right ankle and joints of right foot: Secondary | ICD-10-CM | POA: Diagnosis not present

## 2019-06-26 DIAGNOSIS — G8929 Other chronic pain: Secondary | ICD-10-CM

## 2019-06-26 NOTE — Therapy (Signed)
[address] [place], [place], [address] Phone: [phone]   Fax:  [phone]  Physical Therapy Treatment  Patient Details  Name: Ernest Hughes. MRN: [id number] Date of Birth: 11/05/1945 Referring Provider (PT): Jean Rosenthal MD   Encounter Date: 06/26/2019  PT End of Session - 06/26/19 1141    Visit Number  9    PT Start Time  1100    PT Stop Time  1142    PT Time Calculation (min)  42 min       Past Medical History:  Diagnosis Date  . BPH (benign prostatic hyperplasia)   . Cancer ([place])    PROSTATE--treated with radiation  . Diverticulosis   . GERD (gastroesophageal reflux disease)   . Hypertension     Past Surgical History:  Procedure Laterality Date  . BELPHAROPTOSIS REPAIR  12/2013   bilateral  . COLONOSCOPY     Dr. Sharlett Iles    There were no vitals filed for this visit.  Subjective Assessment - 06/26/19 1103    Subjective  "doing good"    Currently in Pain?  No/denies                       [facility] Adult PT Treatment/Exercise - 06/26/19 0001      Ambulation/Gait   Ambulation/Gait  Yes    Ambulation/Gait Assistance  7: Independent    Ambulation Distance (Feet)  100 Feet    Assistive device  None    Gait Pattern  Within Functional Limits    Ambulation Surface  Level;Indoor    Stairs  Yes    Stair Management Technique  No rails;One rail Right;Alternating pattern    Height of Stairs  6      Knee/Hip Exercises: Aerobic   Recumbent Bike  L 2 x 4 min     Nustep  L 4 5 min      Knee/Hip Exercises: Machines for Strengthening   Cybex Knee Extension  10# 2 sets 15    Cybex Knee Flexion  25# 2 sets 10    Cybex Leg Press  30lb 3x10, RLE 20lb 3x5        Knee/Hip Exercises: Standing   Walking with Sports Cord  40lb 4 way x 4               PT Short Term Goals - 06/12/19 0857      PT SHORT TERM GOAL #1   Title  Pt will demonstrate proper recall  with HEP.    Status  Achieved      PT SHORT TERM GOAL #2   Title  Pt will have appropriate supportive shoes to decrease stress on the R knee and post tib within 3 weeks.    Status  Achieved        PT Long Term Goals - 06/26/19 1104      PT LONG TERM GOAL #2   Title  Pt will report 6/10 pain at the R ankle with ambulation and 3/10 pain in the R knee to demonstrate an improved quality of life.    Status  Partially Met            Plan - 06/26/19 1143    Clinical Impression Statement  pt able to up/down stairs step over step. he needs to use thr right hand rail at the end due to fatigue. pt requires rest breaks between exercises due to fatigue. pt  able to increase reps with resisted gait. pt has no increase pain with walking or stairs. pt denied cryo at the end of session.    Personal Factors and Comorbidities  Time since onset of injury/illness/exacerbation    Stability/Clinical Decision Making  Stable/Uncomplicated    Rehab Potential  Excellent    PT Frequency  2x / week    PT Duration  6 weeks    PT Treatment/Interventions  Neuromuscular re-education;Patient/family education;Therapeutic exercise;Gait training;Iontophoresis '4mg'$ /ml Dexamethasone;Ultrasound;Electrical Stimulation;Cryotherapy    PT Next Visit Plan  hip strengthening, look at pt shoe for adequate support, STM to post tib R knee mobs       Patient will benefit from skilled therapeutic intervention in order to improve the following deficits and impairments:  Decreased range of motion, Abnormal gait, Pain, Decreased strength  Visit Diagnosis: Chronic pain of right knee     Problem List Patient Active Problem List   Diagnosis Date Noted  . Cataract of left eye 04/28/2019  . Erectile dysfunction 04/28/2019  . Onychomycosis 05/05/2014  . History of prostate cancer 05/22/2011  . GERD 02/02/2009  . HYPERCHOLESTEROLEMIA 02/01/2009  . Essential hypertension 02/01/2009  . Diverticulosis of colon 02/01/2009     Barrett Henle, [place] 06/26/2019, 11:47 AM  [address] [place], [place], [address] Phone: [phone]   Fax:  [phone]  Name: Arion Shankles. MRN: [id number] Date of Birth: October 04, 1945

## 2019-07-01 ENCOUNTER — Ambulatory Visit: Payer: Medicare Other | Admitting: Physical Therapy

## 2019-07-01 ENCOUNTER — Other Ambulatory Visit: Payer: Self-pay

## 2019-07-01 ENCOUNTER — Encounter: Payer: Self-pay | Admitting: Physical Therapy

## 2019-07-01 DIAGNOSIS — G8929 Other chronic pain: Secondary | ICD-10-CM

## 2019-07-01 DIAGNOSIS — M25571 Pain in right ankle and joints of right foot: Secondary | ICD-10-CM

## 2019-07-01 DIAGNOSIS — M25561 Pain in right knee: Secondary | ICD-10-CM

## 2019-07-01 NOTE — Therapy (Signed)
[address] [place], [place], [address] Phone: [phone]   Fax:  [phone] Progress Note Reporting Period 05/26/19 to 07/01/19 for the first 10 visits  See note below for Objective Data and Assessment of Progress/Goals.      Physical Therapy Treatment  Patient Details  Name: Ernest Hughes. MRN: [id number] Date of Birth: 18-Sep-1945 Referring Provider (PT): Jean Rosenthal MD   Encounter Date: 07/01/2019  PT End of Session - 07/01/19 1144    Visit Number  10    Number of Visits  14    Date for PT Re-Evaluation  06/23/19    PT Start Time  1100    PT Stop Time  1145    PT Time Calculation (min)  45 min       Past Medical History:  Diagnosis Date  . BPH (benign prostatic hyperplasia)   . Cancer ([place])    PROSTATE--treated with radiation  . Diverticulosis   . GERD (gastroesophageal reflux disease)   . Hypertension     Past Surgical History:  Procedure Laterality Date  . BELPHAROPTOSIS REPAIR  12/2013   bilateral  . COLONOSCOPY     Dr. Sharlett Iles    There were no vitals filed for this visit.  Subjective Assessment - 07/01/19 1104    Subjective  "It is coming alone ok" Walked this morning    Pertinent History  prostate cancer, controlled HTN    Currently in Pain?  No/denies    Pain Location  Knee    Pain Orientation  Right    Pain Descriptors / Indicators  Sore                       [facility] Adult PT Treatment/Exercise - 07/01/19 0001      Knee/Hip Exercises: Aerobic   Recumbent Bike  L 2 x 6h min       Knee/Hip Exercises: Machines for Strengthening   Cybex Knee Extension  15# 2 sets 10, 5lb RLE x10    Cybex Knee Flexion  25# 2 sets 15, RLE 15lb x 10    Cybex Leg Press  40lb 3x10, RLE 20lb 3yx5        Knee/Hip Exercises: Standing   Heel Raises  Both;2 sets;2 seconds;15 reps    Lateral Step Up  Both;1 set;10 reps;Hand Hold: 0;Step Height: 8"    Walking with  Sports Cord  resisted gait black band in hall all directions    Other Standing Knee Exercises  Controlled decents 4in RLE 2x10                PT Short Term Goals - 06/12/19 0857      PT SHORT TERM GOAL #1   Title  Pt will demonstrate proper recall with HEP.    Status  Achieved      PT SHORT TERM GOAL #2   Title  Pt will have appropriate supportive shoes to decrease stress on the R knee and post tib within 3 weeks.    Status  Achieved        PT Long Term Goals - 07/01/19 1105      PT LONG TERM GOAL #1   Title  Patient will demonstrate 5/5 strenght in R ankle inversion/eversion and DF to demonstrate improved functional strength    Status  Achieved      PT LONG TERM GOAL #2   Title  Pt will report 6/10 pain  at the R ankle with ambulation and 3/10 pain in the R knee to demonstrate an improved quality of life.    Status  Partially Met      PT LONG TERM GOAL #3   Title  Pt will demonstrate improved ROM in the R ankle to 5 degrees DF, and 5 degrees eversion to demonstrate improved control at the R ankle.    Status  Achieved            Plan - 07/01/19 1145    Clinical Impression Statement  Pt reports that he is still walking in the mornings, but have some knee soreness afterwards. Today's session went well. Some R quad weakness exposed with controlled decent's. Little compensation noted with 8 inch step ups. Cues to slow down during the eccentric phase of resisted gait.    Personal Factors and Comorbidities  Time since onset of injury/illness/exacerbation    Stability/Clinical Decision Making  Stable/Uncomplicated    Rehab Potential  Excellent    PT Frequency  2x / week    PT Duration  6 weeks    PT Treatment/Interventions  Neuromuscular re-education;Patient/family education;Therapeutic exercise;Gait training;Iontophoresis 60m/ml Dexamethasone;Ultrasound;Electrical Stimulation;Cryotherapy    PT Next Visit Plan  hip strengthening, look at pt shoe for adequate support,  STM to post tib R knee mobs       Patient will benefit from skilled therapeutic intervention in order to improve the following deficits and impairments:  Decreased range of motion, Abnormal gait, Pain, Decreased strength  Visit Diagnosis: Pain in right ankle and joints of right foot  Chronic pain of right knee     Problem List Patient Active Problem List   Diagnosis Date Noted  . Cataract of left eye 04/28/2019  . Erectile dysfunction 04/28/2019  . Onychomycosis 05/05/2014  . History of prostate cancer 05/22/2011  . GERD 02/02/2009  . HYPERCHOLESTEROLEMIA 02/01/2009  . Essential hypertension 02/01/2009  . Diverticulosis of colon 02/01/2009    RScot Jun PTA 07/01/2019, 11:48 AM  [address] [address]Phone: [phone]  Fax:  [phone] Name: JAristeo Hankerson MRN: [id number] of Birth: 507/12/1945

## 2019-07-03 ENCOUNTER — Encounter: Payer: Self-pay | Admitting: Physical Therapy

## 2019-07-03 ENCOUNTER — Other Ambulatory Visit: Payer: Self-pay

## 2019-07-03 ENCOUNTER — Ambulatory Visit: Payer: Medicare Other | Admitting: Physical Therapy

## 2019-07-03 DIAGNOSIS — M25571 Pain in right ankle and joints of right foot: Secondary | ICD-10-CM | POA: Diagnosis not present

## 2019-07-03 DIAGNOSIS — G8929 Other chronic pain: Secondary | ICD-10-CM

## 2019-07-03 NOTE — Therapy (Signed)
[address] [place], [place], [address] Phone: [phone]   Fax:  [phone]  Physical Therapy Treatment  Patient Details  Name: Ernest Hughes. MRN: [id number] Date of Birth: May 26, 1946 Referring Provider (PT): Jean Rosenthal MD   Encounter Date: 07/03/2019  PT End of Session - 07/03/19 1147    Visit Number  11    Date for PT Re-Evaluation  06/23/19    PT Start Time  1100    PT Stop Time  1145    PT Time Calculation (min)  45 min    Activity Tolerance  Patient tolerated treatment well    Behavior During Therapy  [facility] for tasks assessed/performed       Past Medical History:  Diagnosis Date  . BPH (benign prostatic hyperplasia)   . Cancer ([place])    PROSTATE--treated with radiation  . Diverticulosis   . GERD (gastroesophageal reflux disease)   . Hypertension     Past Surgical History:  Procedure Laterality Date  . BELPHAROPTOSIS REPAIR  12/2013   bilateral  . COLONOSCOPY     Dr. Sharlett Iles    There were no vitals filed for this visit.  Subjective Assessment - 07/03/19 1059    Subjective  "I am doing pretty good"    Currently in Pain?  Yes    Pain Score  3     Pain Location  Knee    Pain Orientation  Left;Right                       [facility] Adult PT Treatment/Exercise - 07/03/19 0001      Knee/Hip Exercises: Aerobic   Recumbent Bike  L 2 x 6h min       Knee/Hip Exercises: Machines for Strengthening   Cybex Knee Extension  15# 2 sets 10, 5lb RLE x10    Cybex Knee Flexion  35# 2 sets 10, RLE 15lb 2x 10      Knee/Hip Exercises: Standing   Heel Raises  Both;2 sets;2 seconds;15 reps    Lateral Step Up  Both;1 set;10 reps;Hand Hold: 0;Step Height: 6"   holding 6lb dumbbells   Forward Step Up  Both;1 set;10 reps;Hand Hold: 0;Step Height: 6"   holding 6lb dumbbells   Walking with Sports Cord  40lb 4 way x 4    Other Standing Knee Exercises  Controlled decents 4in  RLE 2x10     Other Standing Knee Exercises  Hip abd/add & Ext 3lb 2x10                PT Short Term Goals - 06/12/19 0857      PT SHORT TERM GOAL #1   Title  Pt will demonstrate proper recall with HEP.    Status  Achieved      PT SHORT TERM GOAL #2   Title  Pt will have appropriate supportive shoes to decrease stress on the R knee and post tib within 3 weeks.    Status  Achieved        PT Long Term Goals - 07/03/19 1123      PT LONG TERM GOAL #1   Title  Patient will demonstrate 5/5 strenght in R ankle inversion/eversion and DF to demonstrate improved functional strength    Status  Achieved            Plan - 07/03/19 1154    Clinical Impression Statement  Pt reports not walking this  morning, feelng pretty good today. Progressed well with SL quad and Hs strengthening. No issues worth forward and lateral step ups holding dumbbells. Cues needed to increase step length during the eccentric phase of resisted gait.    Personal Factors and Comorbidities  Time since onset of injury/illness/exacerbation    Stability/Clinical Decision Making  Stable/Uncomplicated    Rehab Potential  Excellent    PT Frequency  2x / week    PT Duration  6 weeks    PT Treatment/Interventions  Neuromuscular re-education;Patient/family education;Therapeutic exercise;Gait training;Iontophoresis 4mg /ml Dexamethasone;Ultrasound;Electrical Stimulation;Cryotherapy    PT Next Visit Plan  LE strengthening and stability       Patient will benefit from skilled therapeutic intervention in order to improve the following deficits and impairments:  Decreased range of motion, Abnormal gait, Pain, Decreased strength  Visit Diagnosis: Pain in right ankle and joints of right foot  Chronic pain of right knee     Problem List Patient Active Problem List   Diagnosis Date Noted  . Cataract of left eye 04/28/2019  . Erectile dysfunction 04/28/2019  . Onychomycosis 05/05/2014  . History of prostate  cancer 05/22/2011  . GERD 02/02/2009  . HYPERCHOLESTEROLEMIA 02/01/2009  . Essential hypertension 02/01/2009  . Diverticulosis of colon 02/01/2009    [name], PTA 07/03/2019, 12:02 PM  [address] [place], [place], [address] Phone: [phone]   Fax:  [phone]  Name: Ernest Hughes. MRN: [id number] Date of Birth: 02-15-1946

## 2019-07-03 NOTE — Addendum Note (Signed)
Addended by: Sumner Boast on: 07/03/2019 01:47 PM   Modules accepted: Orders

## 2019-07-15 ENCOUNTER — Ambulatory Visit: Payer: Medicare Other | Attending: Orthopaedic Surgery | Admitting: Physical Therapy

## 2019-07-15 ENCOUNTER — Encounter: Payer: Self-pay | Admitting: Physical Therapy

## 2019-07-15 ENCOUNTER — Other Ambulatory Visit: Payer: Self-pay

## 2019-07-15 DIAGNOSIS — M25571 Pain in right ankle and joints of right foot: Secondary | ICD-10-CM | POA: Diagnosis not present

## 2019-07-15 DIAGNOSIS — M25561 Pain in right knee: Secondary | ICD-10-CM | POA: Diagnosis present

## 2019-07-15 DIAGNOSIS — G8929 Other chronic pain: Secondary | ICD-10-CM | POA: Insufficient documentation

## 2019-07-15 NOTE — Therapy (Signed)
[address] [place], [place], [address] Phone: [phone]   Fax:  [phone]  Physical Therapy Treatment  Patient Details  Name: Ernest Hughes. MRN: [id number] Date of Birth: [birth year] Referring Provider (PT): Jean Rosenthal MD   Encounter Date: 07/15/2019  PT End of Session - 07/15/19 1143    Visit Number  12    Date for PT Re-Evaluation  07/24/19    PT Start Time  1100    PT Stop Time  1152    PT Time Calculation (min)  52 min       Past Medical History:  Diagnosis Date  . BPH (benign prostatic hyperplasia)   . Cancer ([place])    PROSTATE--treated with radiation  . Diverticulosis   . GERD (gastroesophageal reflux disease)   . Hypertension     Past Surgical History:  Procedure Laterality Date  . BELPHAROPTOSIS REPAIR  12/2013   bilateral  . COLONOSCOPY     Dr. Sharlett Iles    There were no vitals filed for this visit.  Subjective Assessment - 07/15/19 1100    Subjective  "My knee a little cranky, it started about three days ago"    Currently in Pain?  No/denies    Pain Location  Knee    Pain Orientation  Right    Pain Descriptors / Indicators  Sore                       [facility] Adult PT Treatment/Exercise - 07/15/19 0001      Knee/Hip Exercises: Aerobic   Recumbent Bike  L2 x 4 min     Nustep  L 4 5 min      Knee/Hip Exercises: Machines for Strengthening   Cybex Knee Extension  15# 2 sets 10    Cybex Knee Flexion  35# 2 sets 10    Cybex Leg Press  40lb 3x10, RLE 20lb 3x5        Knee/Hip Exercises: Standing   Heel Raises  Both;2 sets;2 seconds;15 reps    Lateral Step Up  Both;1 set;10 reps;Hand Hold: 0;Step Height: 6"    Forward Step Up  Both;1 set;10 reps;Hand Hold: 0;Step Height: 6"    Walking with Sports Cord  40lb 4 way x 3    Other Standing Knee Exercises  Hip abd/add & Ext 3lb 2x10       Cryotherapy   Number Minutes Cryotherapy  10 Minutes    Cryotherapy Location  Knee    Type of Cryotherapy  Ice pack               PT Short Term Goals - 06/12/19 0857      PT SHORT TERM GOAL #1   Title  Pt will demonstrate proper recall with HEP.    Status  Achieved      PT SHORT TERM GOAL #2   Title  Pt will have appropriate supportive shoes to decrease stress on the R knee and post tib within 3 weeks.    Status  Achieved        PT Long Term Goals - 07/03/19 1123      PT LONG TERM GOAL #1   Title  Patient will demonstrate 5/5 strenght in R ankle inversion/eversion and DF to demonstrate improved functional strength    Status  Achieved            Plan - 07/15/19 1144    Clinical  Impression Statement  Pt enters clinic reporting that his knee has been bothersome. This has started back recently. No issues with today's interventions. Avoided SL strengthening activities. Cues to control gait speed eccentrically with resisted gait.    Personal Factors and Comorbidities  Time since onset of injury/illness/exacerbation    Rehab Potential  Excellent    PT Frequency  2x / week    PT Treatment/Interventions  Neuromuscular re-education;Patient/family education;Therapeutic exercise;Gait training;Iontophoresis 4mg /ml Dexamethasone;Ultrasound;Electrical Stimulation;Cryotherapy    PT Next Visit Plan  LE strengthening and stability       Patient will benefit from skilled therapeutic intervention in order to improve the following deficits and impairments:  Decreased range of motion, Abnormal gait, Pain, Decreased strength  Visit Diagnosis: Pain in right ankle and joints of right foot  Chronic pain of right knee     Problem List Patient Active Problem List   Diagnosis Date Noted  . Cataract of left eye 04/28/2019  . Erectile dysfunction 04/28/2019  . Onychomycosis 05/05/2014  . History of prostate cancer 05/22/2011  . GERD 02/02/2009  . HYPERCHOLESTEROLEMIA 02/01/2009  . Essential hypertension 02/01/2009  . Diverticulosis  of colon 02/01/2009    [name], PTA 07/15/2019, 11:49 AM  [address] [place], [place], [address] Phone: [phone]   Fax:  [phone]  Name: Ernest Hughes. MRN: [id number] Date of Birth: 1946/05/29

## 2019-07-17 ENCOUNTER — Ambulatory Visit: Payer: Medicare Other | Admitting: Physical Therapy

## 2019-07-17 ENCOUNTER — Encounter: Payer: Self-pay | Admitting: Physical Therapy

## 2019-07-17 ENCOUNTER — Other Ambulatory Visit: Payer: Self-pay

## 2019-07-17 DIAGNOSIS — M25571 Pain in right ankle and joints of right foot: Secondary | ICD-10-CM

## 2019-07-17 DIAGNOSIS — G8929 Other chronic pain: Secondary | ICD-10-CM

## 2019-07-17 DIAGNOSIS — M25561 Pain in right knee: Secondary | ICD-10-CM

## 2019-07-17 NOTE — Therapy (Signed)
[address] [place], [place], [address] Phone: [phone]   Fax:  [phone]  Physical Therapy Treatment  Patient Details  Name: Ernest Hughes. MRN: [id number] Date of Birth: 1945/11/11 Referring Provider (PT): Jean Rosenthal MD   Encounter Date: 07/17/2019  PT End of Session - 07/17/19 1145    Visit Number  13    Date for PT Re-Evaluation  07/24/19    PT Start Time  1100    PT Stop Time  1154    PT Time Calculation (min)  54 min    Activity Tolerance  Patient tolerated treatment well    Behavior During Therapy  [facility] for tasks assessed/performed       Past Medical History:  Diagnosis Date  . BPH (benign prostatic hyperplasia)   . Cancer ([place])    PROSTATE--treated with radiation  . Diverticulosis   . GERD (gastroesophageal reflux disease)   . Hypertension     Past Surgical History:  Procedure Laterality Date  . BELPHAROPTOSIS REPAIR  12/2013   bilateral  . COLONOSCOPY     Dr. Sharlett Iles    There were no vitals filed for this visit.  Subjective Assessment - 07/17/19 1104    Subjective  "Not too bad right now, Like I said it as bothering me this morning"    Currently in Pain?  No/denies    Pain Score  0-No pain                       [facility] Adult PT Treatment/Exercise - 07/17/19 0001      Ambulation/Gait   Stairs  Yes    Stairs Assistance  6: Modified independent (Device/Increase time)    Stair Management Technique  No rails;One rail Right;Alternating pattern    Number of Stairs  48    Height of Stairs  6      Knee/Hip Exercises: Stretches   ITB Stretch  Right;4 reps;10 seconds;20 seconds      Knee/Hip Exercises: Aerobic   Recumbent Bike  L2 x 6 min       Knee/Hip Exercises: Machines for Strengthening   Cybex Knee Extension  15# 2 sets 10, RLE 5lb 2x10     Cybex Knee Flexion  35# 2 sets 10, RLE 15lb 2x10     Cybex Leg Press  40lb 3x10, RLE 20lb 3x5         Knee/Hip Exercises: Standing   Walking with Sports Cord  40lb 4 way x 4    Other Standing Knee Exercises  Controlled decents 4in RLE 2x10       Cryotherapy   Number Minutes Cryotherapy  10 Minutes    Cryotherapy Location  Knee    Type of Cryotherapy  Ice pack               PT Short Term Goals - 06/12/19 0857      PT SHORT TERM GOAL #1   Title  Pt will demonstrate proper recall with HEP.    Status  Achieved      PT SHORT TERM GOAL #2   Title  Pt will have appropriate supportive shoes to decrease stress on the R knee and post tib within 3 weeks.    Status  Achieved        PT Long Term Goals - 07/03/19 1123      PT LONG TERM GOAL #1   Title  Patient will demonstrate 5/5  strenght in R ankle inversion/eversion and DF to demonstrate improved functional strength    Status  Achieved            Plan - 07/17/19 1145    Clinical Impression Statement  Some knee pain reported this morning when he got up but it went away after he walked around. Some ITB tightness reported descending stairs.  Good strength and ROM on all machine level interventions. No instability with resisted gait. ITB tightness noted with stretching.,    Personal Factors and Comorbidities  Time since onset of injury/illness/exacerbation    Stability/Clinical Decision Making  Stable/Uncomplicated    Rehab Potential  Excellent    PT Frequency  2x / week    PT Treatment/Interventions  Neuromuscular re-education;Patient/family education;Therapeutic exercise;Gait training;Iontophoresis 4mg /ml Dexamethasone;Ultrasound;Electrical Stimulation;Cryotherapy    PT Next Visit Plan  LE strengthening and stability       Patient will benefit from skilled therapeutic intervention in order to improve the following deficits and impairments:  Decreased range of motion, Abnormal gait, Pain, Decreased strength  Visit Diagnosis: Chronic pain of right knee  Pain in right ankle and joints of right foot     Problem  List Patient Active Problem List   Diagnosis Date Noted  . Cataract of left eye 04/28/2019  . Erectile dysfunction 04/28/2019  . Onychomycosis 05/05/2014  . History of prostate cancer 05/22/2011  . GERD 02/02/2009  . HYPERCHOLESTEROLEMIA 02/01/2009  . Essential hypertension 02/01/2009  . Diverticulosis of colon 02/01/2009    [name], PTA 07/17/2019, 11:52 AM  [address] [place], [place], [address] Phone: [phone]   Fax:  [phone]  Name: Rustam Squillace. MRN: [id number] Date of Birth: Jul 14, 1946

## 2019-07-23 ENCOUNTER — Ambulatory Visit: Payer: Medicare Other | Admitting: Physical Therapy

## 2019-07-23 ENCOUNTER — Other Ambulatory Visit: Payer: Self-pay

## 2019-07-23 DIAGNOSIS — G8929 Other chronic pain: Secondary | ICD-10-CM

## 2019-07-23 DIAGNOSIS — M25571 Pain in right ankle and joints of right foot: Secondary | ICD-10-CM | POA: Diagnosis not present

## 2019-07-23 NOTE — Therapy (Signed)
[address] [place], [place], [address] Phone: [phone]   Fax:  [phone]  Physical Therapy Treatment  Patient Details  Name: Ernest Hughes. MRN: [id number] Date of Birth: 1945-08-27 Referring Provider (PT): Jean Rosenthal MD   Encounter Date: 07/23/2019  PT End of Session - 07/23/19 1554    Visit Number  14    Number of Visits  14    Date for PT Re-Evaluation  07/24/19    PT Start Time  9678    PT Stop Time  1555    PT Time Calculation (min)  40 min       Past Medical History:  Diagnosis Date  . BPH (benign prostatic hyperplasia)   . Cancer ([place])    PROSTATE--treated with radiation  . Diverticulosis   . GERD (gastroesophageal reflux disease)   . Hypertension     Past Surgical History:  Procedure Laterality Date  . BELPHAROPTOSIS REPAIR  12/2013   bilateral  . COLONOSCOPY     Dr. Sharlett Iles    There were no vitals filed for this visit.  Subjective Assessment - 07/23/19 1520    Subjective  "feeling pretty good"    Pertinent History  prostate cancer, controlled HTN    How long can you stand comfortably?  not long before the ankle starts to bother him.    How long can you walk comfortably?  3-4 miles/day    Diagnostic tests  x-ray with no significant findings in the R ankle and with findings of degenerative changes in the R knee.    Currently in Pain?  No/denies    Pain Score  0-No pain                       [facility] Adult PT Treatment/Exercise - 07/23/19 0001      Knee/Hip Exercises: Aerobic   Elliptical  2 fwd/2 back I 8 R 5    Nustep  L 4 x 5 min      Knee/Hip Exercises: Machines for Strengthening   Cybex Knee Extension  15# 2 sets 10    Cybex Knee Flexion  35# 2 sets 15    Cybex Leg Press  40lb 2 x 15      Knee/Hip Exercises: Standing   Heel Raises  Both;10 reps;2 seconds    Step Down  Both;20 reps;Hand Hold: 2;Step Height: 6"      Knee/Hip  Exercises: Seated   Sit to Sand  without UE support;10 reps               PT Short Term Goals - 06/12/19 0857      PT SHORT TERM GOAL #1   Title  Pt will demonstrate proper recall with HEP.    Status  Achieved      PT SHORT TERM GOAL #2   Title  Pt will have appropriate supportive shoes to decrease stress on the R knee and post tib within 3 weeks.    Status  Achieved        PT Long Term Goals - 07/23/19 1521      PT LONG TERM GOAL #2   Title  Pt will report 6/10 pain at the R ankle with ambulation and 3/10 pain in the R knee to demonstrate an improved quality of life.    Status  Partially Met            Plan - 07/23/19 1557  Clinical Impression Statement  pt able to go up and up staires. he needs cues to keep his feet straight and not cirsumduct hip. pt has some quad weaknees when descending stairs. pt able inincrease reps with knee flexion and extension. pt is still working on decreasing pain with ambulance.    Personal Factors and Comorbidities  Time since onset of injury/illness/exacerbation    Stability/Clinical Decision Making  Stable/Uncomplicated    Rehab Potential  Excellent    PT Frequency  2x / week    PT Duration  6 weeks    PT Treatment/Interventions  Neuromuscular re-education;Patient/family education;Therapeutic exercise;Gait training;Iontophoresis 27m/ml Dexamethasone;Ultrasound;Electrical Stimulation;Cryotherapy    PT Next Visit Plan  LE strengthening and stability    PT Home Exercise Plan  Standing marching, side steps, LAQ       Patient will benefit from skilled therapeutic intervention in order to improve the following deficits and impairments:  Decreased range of motion, Abnormal gait, Pain, Decreased strength  Visit Diagnosis: Chronic pain of right knee     Problem List Patient Active Problem List   Diagnosis Date Noted  . Cataract of left eye 04/28/2019  . Erectile dysfunction 04/28/2019  . Onychomycosis 05/05/2014  . History  of prostate cancer 05/22/2011  . GERD 02/02/2009  . HYPERCHOLESTEROLEMIA 02/01/2009  . Essential hypertension 02/01/2009  . Diverticulosis of colon 02/01/2009    SBarrett Henle SDwale12/04/2019, 4:03 PM  [address] [address]Phone: [phone]  Fax:  [phone] Name: Ernest Hughes MRN: [id number] of Birth: [birth year]

## 2019-07-28 ENCOUNTER — Ambulatory Visit: Payer: Medicare Other | Admitting: Physical Therapy

## 2019-07-30 ENCOUNTER — Ambulatory Visit: Payer: Medicare Other | Admitting: Physical Therapy

## 2019-07-31 ENCOUNTER — Telehealth: Payer: Self-pay | Admitting: *Deleted

## 2019-07-31 DIAGNOSIS — Z79899 Other long term (current) drug therapy: Secondary | ICD-10-CM

## 2019-07-31 DIAGNOSIS — B351 Tinea unguium: Secondary | ICD-10-CM

## 2019-07-31 NOTE — Telephone Encounter (Signed)
Patient needs liver function blood work if normal then I can send Lamisil to pharmacy. Lamisil will not interact with any of his current medication but he will have to closely be monitored in office and return after 6 weeks of taking Lamisil for re-check

## 2019-07-31 NOTE — Telephone Encounter (Signed)
I spoke with pt concerning treatment for fungal infection. Pt asked if he had the laser treatment would the fungus return. I told pt with fungus it was possible just for the nature of fungus, but in most cases it could be cured as long as he was not exposed again. Pt states he would like to use the pill to treat the fungus and would like Dr. Cannon Kettle to review his medications, prior to ordering. I told pt Dr. Cannon Kettle would review the medications and he would need to have blood work prior to beginning therapy and again 6 weeks later, but I would call with instruction once Dr. Cannon Kettle ordered.

## 2019-07-31 NOTE — Telephone Encounter (Signed)
Pt states he has made his selection for his treatment.

## 2019-08-01 NOTE — Telephone Encounter (Addendum)
Left message informing pt of Dr. Leeanne Rio 07/31/2019 12:01pm.

## 2019-08-06 ENCOUNTER — Encounter: Payer: Self-pay | Admitting: Physical Therapy

## 2019-08-06 ENCOUNTER — Ambulatory Visit: Payer: Medicare Other | Admitting: Physical Therapy

## 2019-08-06 ENCOUNTER — Other Ambulatory Visit: Payer: Self-pay

## 2019-08-06 DIAGNOSIS — M25571 Pain in right ankle and joints of right foot: Secondary | ICD-10-CM | POA: Diagnosis not present

## 2019-08-06 DIAGNOSIS — G8929 Other chronic pain: Secondary | ICD-10-CM

## 2019-08-06 DIAGNOSIS — M25561 Pain in right knee: Secondary | ICD-10-CM

## 2019-08-06 NOTE — Therapy (Signed)
[address] [place], [place], [address] Phone: [phone]   Fax:  [phone]  Physical Therapy Treatment  Patient Details  Name: Ernest Hughes. MRN: [id number] Date of Birth: 10/31/1945 Referring Provider (PT): Jean Rosenthal MD   Encounter Date: 08/06/2019  PT End of Session - 08/06/19 1328    Visit Number  15    Date for PT Re-Evaluation  08/24/19    PT Start Time  1300    PT Stop Time  1322    PT Time Calculation (min)  22 min    Activity Tolerance  Patient tolerated treatment well    Behavior During Therapy  [facility] for tasks assessed/performed       Past Medical History:  Diagnosis Date  . BPH (benign prostatic hyperplasia)   . Cancer ([place])    PROSTATE--treated with radiation  . Diverticulosis   . GERD (gastroesophageal reflux disease)   . Hypertension     Past Surgical History:  Procedure Laterality Date  . BELPHAROPTOSIS REPAIR  12/2013   bilateral  . COLONOSCOPY     Dr. Sharlett Iles    There were no vitals filed for this visit.  Subjective Assessment - 08/06/19 1306    Subjective  "I feel ok" I fell pretty good with the brace    Currently in Pain?  No/denies         [facility] PT Assessment - 08/06/19 0001      AROM   Overall AROM   Within functional limits for tasks performed      Strength   Overall Strength  Within functional limits for tasks performed                           PT Education - 08/06/19 1319    Education Details  Pt asked question about his knee and progression. He good strength and ROM in his R knee. He reports no functional limitations at home. However he reports the occasional pain with after activity. He wears a sleeve at time and that gives him comfortable. He is nervous to damage his knee any further. I informed the pt to return to his MD if  he starts to repress in function and have limitation. Pt has some cartilage loss in the  medial aspect of his knee per MRI. He had questions about the therapy part of a knee replacement so I went over with him the process from a therapy stand paint and hoe each individual are different in their response.    Person(s) Educated  Patient    Methods  Explanation;Tactile cues;Verbal cues    Comprehension  Verbalized understanding       PT Short Term Goals - 06/12/19 0857      PT SHORT TERM GOAL #1   Title  Pt will demonstrate proper recall with HEP.    Status  Achieved      PT SHORT TERM GOAL #2   Title  Pt will have appropriate supportive shoes to decrease stress on the R knee and post tib within 3 weeks.    Status  Achieved        PT Long Term Goals - 08/06/19 1328      PT LONG TERM GOAL #1   Title  Patient will demonstrate 5/5 strenght in R ankle inversion/eversion and DF to demonstrate improved functional strength    Status  Achieved  PT LONG TERM GOAL #2   Title  Pt will report 6/10 pain at the R ankle with ambulation and 3/10 pain in the R knee to demonstrate an improved quality of life.      PT LONG TERM GOAL #3   Title  Pt will demonstrate improved ROM in the R ankle to 5 degrees DF, and 5 degrees eversion to demonstrate improved control at the R ankle.    Status  Achieved            Plan - 08/06/19 1326    Clinical Impression Statement  Answered pt questions about his current progress. Also answered question about the therapy part of a TKA    Personal Factors and Comorbidities  Time since onset of injury/illness/exacerbation    Stability/Clinical Decision Making  Stable/Uncomplicated    Rehab Potential  Excellent    PT Frequency  2x / week    PT Treatment/Interventions  Neuromuscular re-education;Patient/family education;Therapeutic exercise;Gait training;Iontophoresis '4mg'$ /ml Dexamethasone;Ultrasound;Electrical Stimulation;Cryotherapy    PT Next Visit Plan  D/C PT       Patient will benefit from skilled therapeutic intervention in order to  improve the following deficits and impairments:  Decreased range of motion, Abnormal gait, Pain, Decreased strength  Visit Diagnosis: Pain in right ankle and joints of right foot  Chronic pain of right knee     Problem List Patient Active Problem List   Diagnosis Date Noted  . Cataract of left eye 04/28/2019  . Erectile dysfunction 04/28/2019  . Onychomycosis 05/05/2014  . History of prostate cancer 05/22/2011  . GERD 02/02/2009  . HYPERCHOLESTEROLEMIA 02/01/2009  . Essential hypertension 02/01/2009  . Diverticulosis of colon 02/01/2009   PHYSICAL THERAPY DISCHARGE SUMMARY  Visits from Start of Care: 15  Plan: Patient agrees to discharge.  Patient goals were met. Patient is being discharged due to being pleased with the current functional level.  ?????     [name], PTA 08/06/2019, 1:29 PM  [address] [place], [place], [address] Phone: [phone]   Fax:  [phone]  Name: Ernest Hughes. MRN: [id number] Date of Birth: 10/03/1945

## 2019-09-12 ENCOUNTER — Other Ambulatory Visit: Payer: Self-pay | Admitting: Family Medicine

## 2019-09-12 DIAGNOSIS — E7800 Pure hypercholesterolemia, unspecified: Secondary | ICD-10-CM

## 2019-10-06 DIAGNOSIS — L309 Dermatitis, unspecified: Secondary | ICD-10-CM | POA: Diagnosis not present

## 2019-10-17 ENCOUNTER — Other Ambulatory Visit: Payer: Self-pay | Admitting: Family Medicine

## 2019-10-17 DIAGNOSIS — E7800 Pure hypercholesterolemia, unspecified: Secondary | ICD-10-CM

## 2019-10-29 DIAGNOSIS — H25811 Combined forms of age-related cataract, right eye: Secondary | ICD-10-CM | POA: Diagnosis not present

## 2019-10-29 DIAGNOSIS — H10413 Chronic giant papillary conjunctivitis, bilateral: Secondary | ICD-10-CM | POA: Diagnosis not present

## 2019-10-29 DIAGNOSIS — H04123 Dry eye syndrome of bilateral lacrimal glands: Secondary | ICD-10-CM | POA: Diagnosis not present

## 2019-10-29 DIAGNOSIS — H47323 Drusen of optic disc, bilateral: Secondary | ICD-10-CM | POA: Diagnosis not present

## 2019-10-29 DIAGNOSIS — Z961 Presence of intraocular lens: Secondary | ICD-10-CM | POA: Diagnosis not present

## 2019-11-06 DIAGNOSIS — C61 Malignant neoplasm of prostate: Secondary | ICD-10-CM | POA: Diagnosis not present

## 2019-11-11 ENCOUNTER — Ambulatory Visit: Payer: Medicare Other | Admitting: Orthopaedic Surgery

## 2019-11-13 DIAGNOSIS — N5201 Erectile dysfunction due to arterial insufficiency: Secondary | ICD-10-CM | POA: Diagnosis not present

## 2019-11-13 DIAGNOSIS — C61 Malignant neoplasm of prostate: Secondary | ICD-10-CM | POA: Diagnosis not present

## 2019-11-13 DIAGNOSIS — R351 Nocturia: Secondary | ICD-10-CM | POA: Diagnosis not present

## 2019-11-13 DIAGNOSIS — N401 Enlarged prostate with lower urinary tract symptoms: Secondary | ICD-10-CM | POA: Diagnosis not present

## 2019-11-17 ENCOUNTER — Encounter: Payer: Self-pay | Admitting: Orthopaedic Surgery

## 2019-11-17 ENCOUNTER — Other Ambulatory Visit: Payer: Self-pay

## 2019-11-17 ENCOUNTER — Ambulatory Visit: Payer: Medicare Other | Admitting: Orthopaedic Surgery

## 2019-11-17 DIAGNOSIS — M25571 Pain in right ankle and joints of right foot: Secondary | ICD-10-CM

## 2019-11-17 DIAGNOSIS — M76821 Posterior tibial tendinitis, right leg: Secondary | ICD-10-CM

## 2019-11-17 NOTE — Progress Notes (Signed)
Office Visit Note   Patient: Ernest Hughes.           Date of Birth: 1945/11/30           MRN: [id number] Visit Date: 11/17/2019              Requested by: Denita Lung, MD [place],  [place] [address] PCP: Denita Lung, MD   Assessment & Plan: Visit Diagnoses:  1. Pain in right ankle and joints of right foot   2. Posterior tibial tendon dysfunction, right     Plan: At this point we are going to try a short walking boot for his right ankle and I would like to send him for a MRI at this point to assess the posterior tibial tendon of the right ankle.  This is warranted now based on his failure of conservative treatment and his clinical exam findings.  We will see him back after the MRI of his right ankle.  I will hold off on ordering any type of inserts or sending him to a foot and ankle specialist until we see what this MRI shows.  He has been dealing with some pain on his left foot and ankle and I do feel this is more from compensating dealing with his right knee and his right foot and ankle.  He has known osteoarthritis of the right knee.  Follow-Up Instructions: Return in about 2 weeks (around 12/01/2019).   Orders:  No orders of the defined types were placed in this encounter.  No orders of the defined types were placed in this encounter.     Procedures: No procedures performed   Clinical Data: No additional findings.   Subjective: Chief Complaint  Patient presents with  . Right Foot - Follow-up  The patient is being seen today for continued issues with his right foot and ankle.  He has flat feet deformities bilaterally.  He has much more swelling and pain on the medial aspect of his right ankle and right foot.  This is been getting slowly worse for him.  He has tried to get firmer shoes with more support.  He states that there is shoes that are more spongy hurt less when he is walking.  HPI  Review of Systems He currently denies any  headache, chest pain, shortness of breath, fever, chills, nausea, vomiting  Objective: Vital Signs: There were no vitals taken for this visit.  Physical Exam Is alert and orient x3 and in no acute distress Ortho Exam Examination of his right foot and ankle shows an obvious flatfoot deformity.  He cannot perform a toe raise on the right side but he can on the left showing obvious posterior tibial tendon deficiency on the right side.  He has tender to palpation along the posterior tibial tendon and the medial aspect of his right ankle and right foot and the heel itself.  He has pretty good range of motion of the right ankle.  He can perform a toe raise that is isolated on the left side. Specialty Comments:  No specialty comments available.  Imaging: No results found. Previous x-rays of the right ankle shows no obvious deformities of the ankle joint was a flatfoot deformity with a lateral view.  PMFS History: Patient Active Problem List   Diagnosis Date Noted  . Cataract of left eye 04/28/2019  . Erectile dysfunction 04/28/2019  . Onychomycosis 05/05/2014  . History of prostate cancer 05/22/2011  . GERD 02/02/2009  .  HYPERCHOLESTEROLEMIA 02/01/2009  . Essential hypertension 02/01/2009  . Diverticulosis of colon 02/01/2009   Past Medical History:  Diagnosis Date  . BPH (benign prostatic hyperplasia)   . Cancer ([place])    PROSTATE--treated with radiation  . Diverticulosis   . GERD (gastroesophageal reflux disease)   . Hypertension     Family History  Problem Relation Age of Onset  . Cancer Mother   . Cancer Father   . Heart disease Sister   . Cancer Maternal Aunt   . Cancer Paternal Aunt   . Cancer Paternal Grandfather     Past Surgical History:  Procedure Laterality Date  . BELPHAROPTOSIS REPAIR  12/2013   bilateral  . COLONOSCOPY     Dr. Sharlett Iles   Social History   Occupational History  . Not on file  Tobacco Use  . Smoking status: Never Smoker  . Smokeless  tobacco: Never Used  Substance and Sexual Activity  . Alcohol use: Yes    Comment: has some wine once in a while   . Drug use: No  . Sexual activity: Not Currently

## 2019-11-18 ENCOUNTER — Other Ambulatory Visit: Payer: Self-pay | Admitting: Family Medicine

## 2019-11-18 DIAGNOSIS — E7800 Pure hypercholesterolemia, unspecified: Secondary | ICD-10-CM

## 2019-11-18 MED ORDER — ATORVASTATIN CALCIUM 10 MG PO TABS: 10.0000 mg | ORAL_TABLET | Freq: Every day | ORAL | 1 refills | Status: DC

## 2019-11-30 ENCOUNTER — Ambulatory Visit
Admission: RE | Admit: 2019-11-30 | Discharge: 2019-11-30 | Disposition: A | Discharge status: Home / Self Care | Payer: Medicare PPO | Source: Ambulatory Visit | Attending: Orthopaedic Surgery | Admitting: Orthopaedic Surgery

## 2019-11-30 ENCOUNTER — Other Ambulatory Visit: Payer: Self-pay

## 2019-11-30 DIAGNOSIS — M76821 Posterior tibial tendinitis, right leg: Secondary | ICD-10-CM

## 2019-11-30 DIAGNOSIS — R600 Localized edema: Secondary | ICD-10-CM | POA: Diagnosis not present

## 2019-11-30 DIAGNOSIS — M25571 Pain in right ankle and joints of right foot: Secondary | ICD-10-CM | POA: Diagnosis not present

## 2019-11-30 IMAGING — MR MR ANKLE*R* W/O CM
5 series · 37 of 40 positions shown · non-contrast
Comparison: None.

CLINICAL DATA: No known injury, right ankle pain for several weeks.

EXAM:
MRI OF THE RIGHT ANKLE WITHOUT CONTRAST
TECHNIQUE: Multiplanar, multisequence MR imaging of the ankle was performed. No
intravenous contrast was administered.

[Series 4: T2 fat-sat · axial · 3.0mm · 0.50mm/px · z∈[-146,-18]mm · 8 of 34 slices shown (1 of 2)]
[im 1/34]
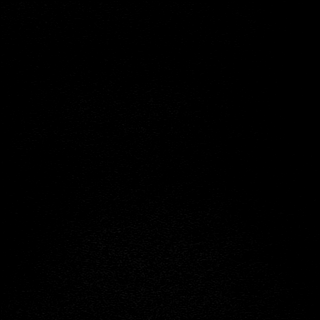
[im 4/34]
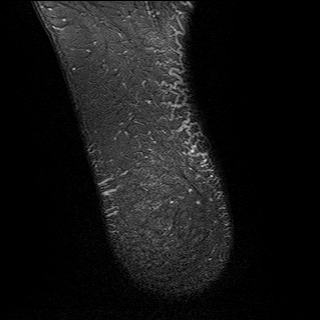
[im 12/34]
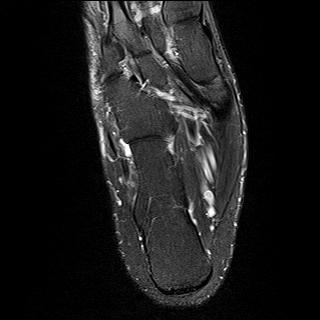
[im 15/34]
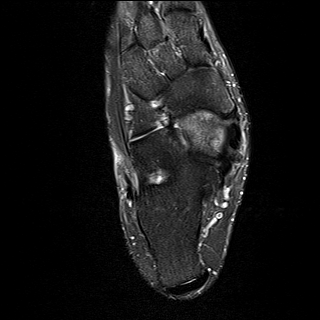
[im 19/34]
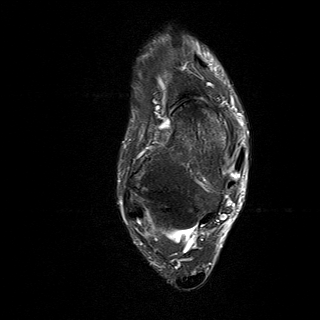
[im 23/34]
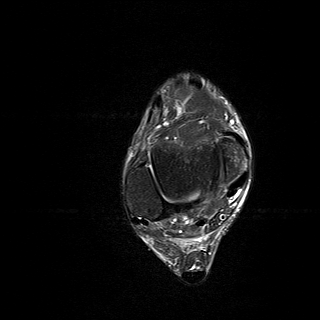
[im 30/34]
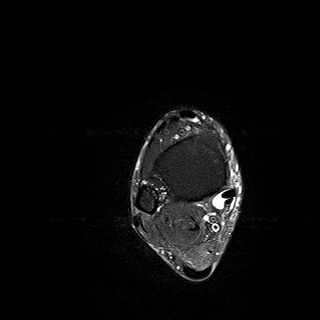
[im 34/34]
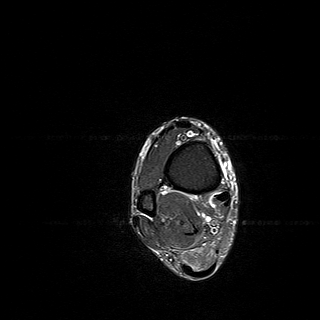

[Series 5: PD fat-sat · axial · 3.0mm · 0.42mm/px · z∈[-146,-18]mm · 9 of 34 slices shown]
[im 1/34]
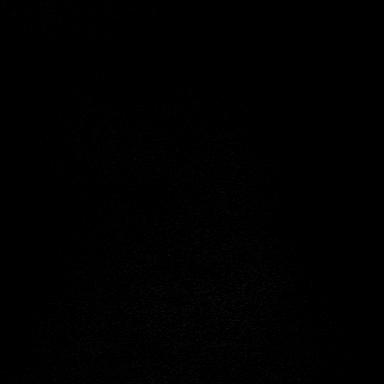
[im 5/34]
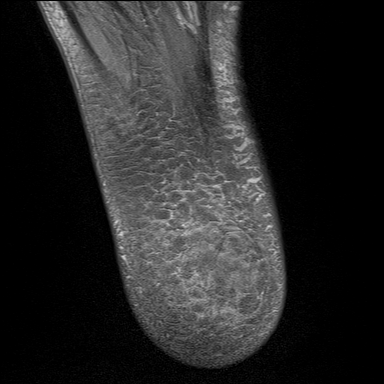
[im 9/34]
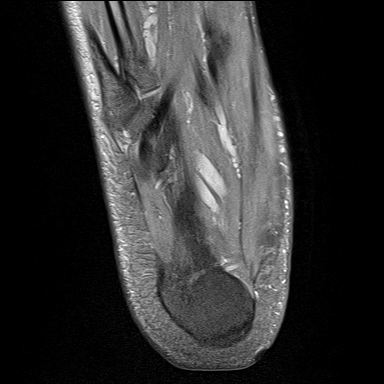
[im 13/34]
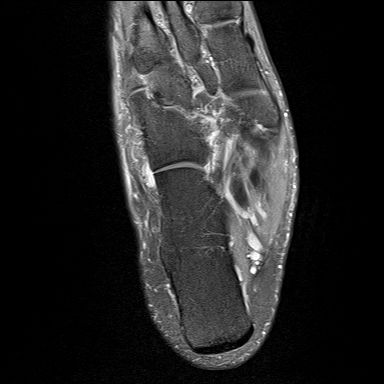
[im 17/34]
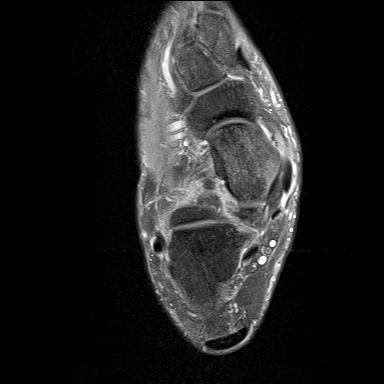
[im 21/34]
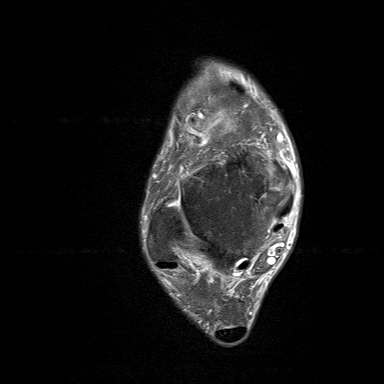
[im 25/34]
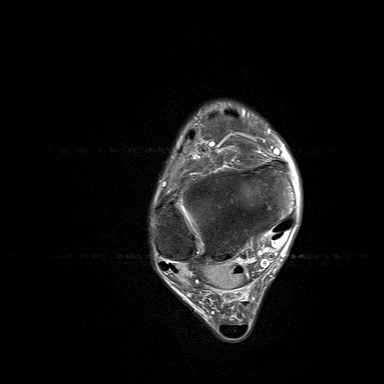
[im 29/34]
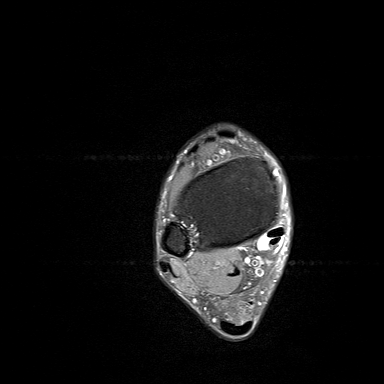
[im 34/34]
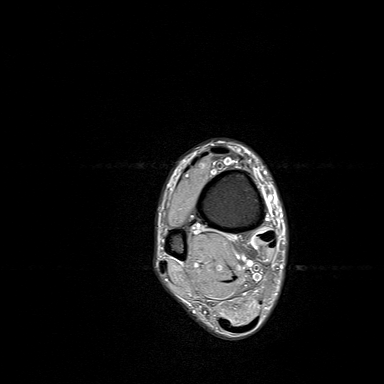

[Series 6: T1 · sagittal · 4.0mm · 0.56mm/px · 5 of 21 slices shown]
[im 1/21]
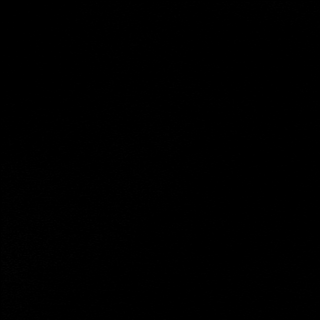
[im 6/21]
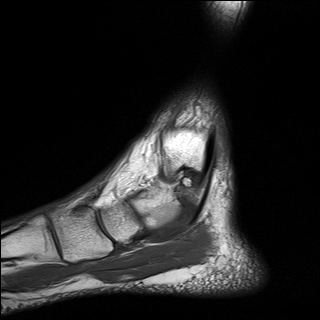
[im 11/21]
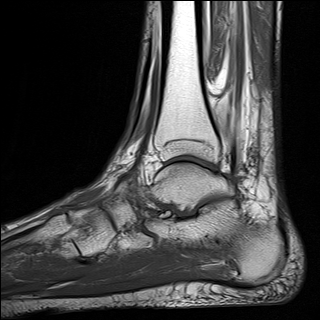
[im 16/21]
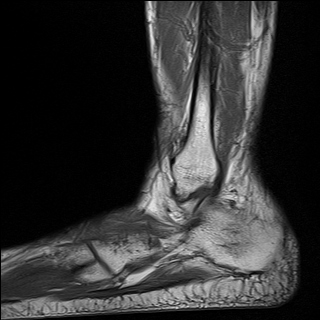
[im 21/21]
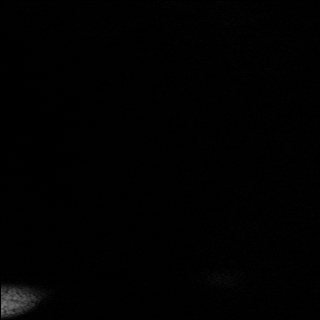

[Series 7: STIR · sagittal · 4.0mm · 0.35mm/px · 4 of 21 slices shown]
[im 1/21]
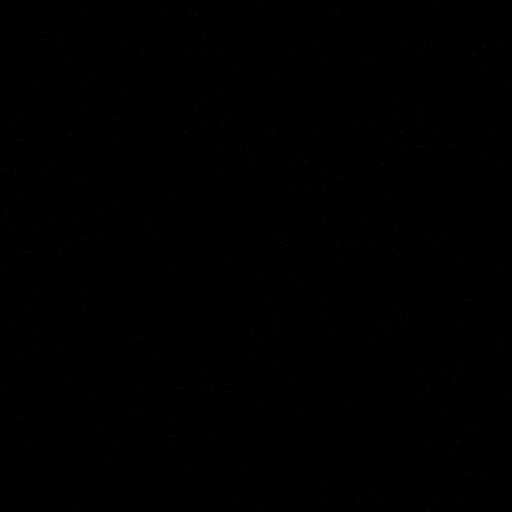
[im 6/21]
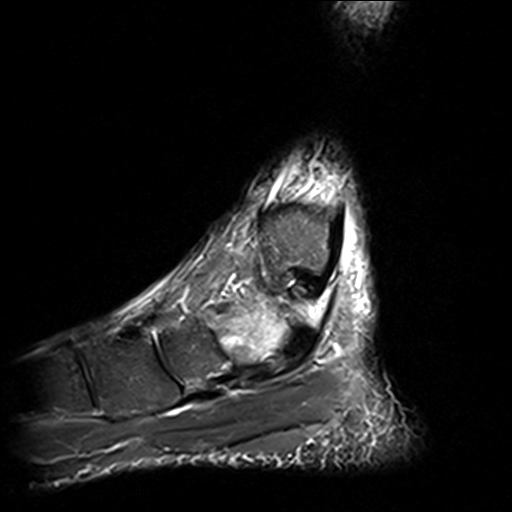
[im 11/21]
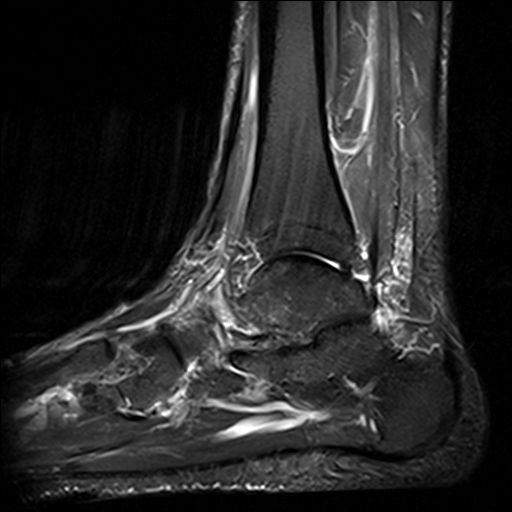
[im 16/21]
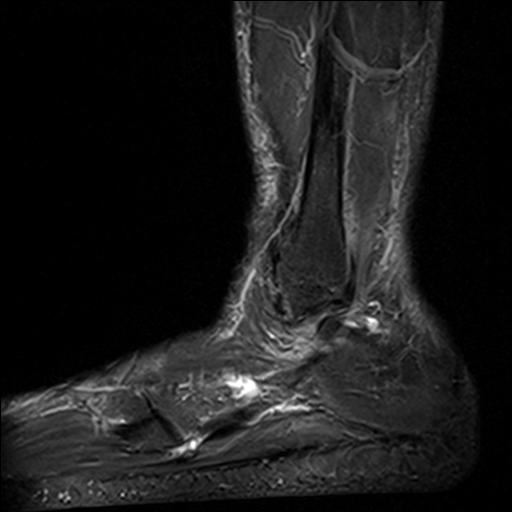

[Series 8: T2 fat-sat · coronal · 3.0mm · 0.50mm/px · 11 of 43 slices shown (2 of 2)]
[im 1/43]
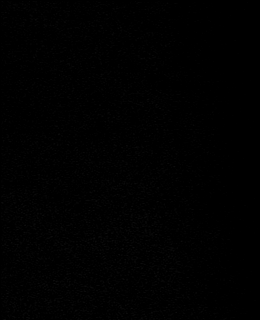
[im 5/43]
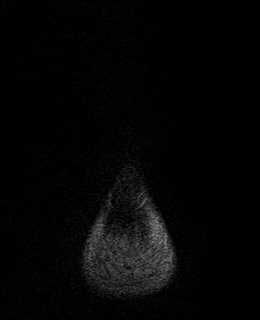
[im 9/43]
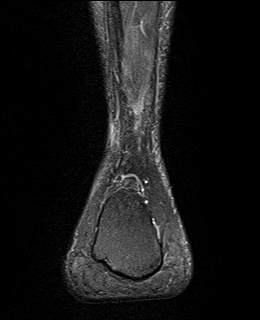
[im 13/43]
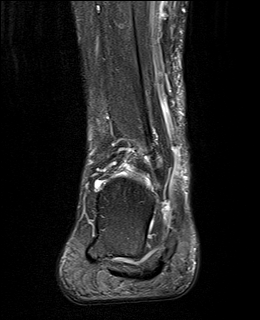
[im 17/43]
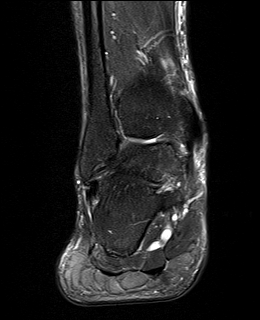
[im 22/43]
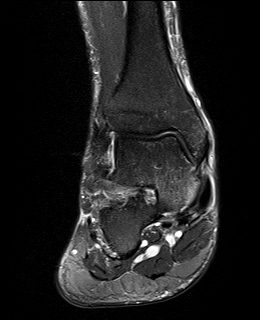
[im 26/43]
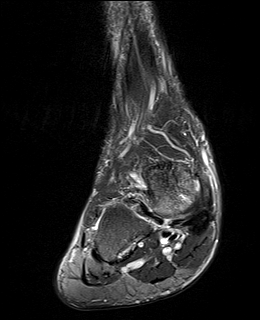
[im 30/43]
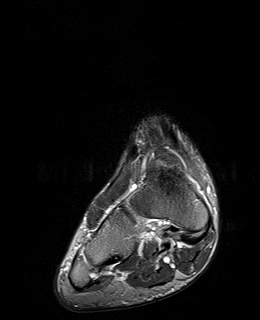
[im 34/43]
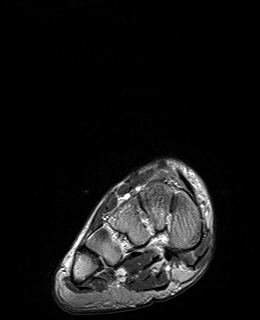
[im 38/43]
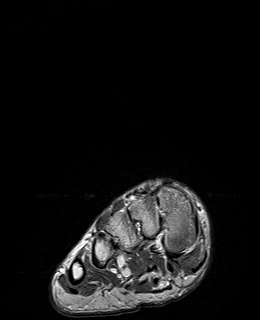
[im 43/43]
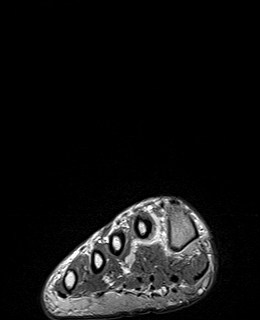

[37 of 40 positions shown; findings below may reference images not displayed]

FINDINGS: TENDONS

Peroneal: Peroneal longus tendon intact. Peroneal brevis intact.

Posteromedial: Posterior tibial tendon intact. Flexor hallucis
longus tendon intact. Flexor digitorum longus tendon intact.

Anterior: Tibialis anterior tendon intact. Extensor hallucis longus
tendon intact Extensor digitorum longus tendon intact.

Achilles: Mild tendinosis of the Achilles tendon without a tear.
Mild edema in Kager's fat.

Plantar Fascia: Intact.

LIGAMENTS

Lateral: Anterior talofibular ligament intact. Calcaneofibular
ligament intact. Posterior talofibular ligament intact. Anterior and
posterior tibiofibular ligaments intact.

Medial: Deltoid ligament intact. Spring ligament intact.

CARTILAGE

Ankle Joint: No joint effusion. Normal ankle mortise. No chondral
defect.

Subtalar Joints/Sinus Tarsi: Normal subtalar joints. No subtalar
joint effusion. Normal sinus tarsi.

Bones: No acute osseous abnormality. No aggressive osseous lesion.
High-grade partial-thickness cartilage loss of the medial aspect of
the talonavicular joint with subchondral marrow edema in the talus.

Soft Tissue: No soft tissue mass. No fluid collection. Muscles are
normal.
IMPRESSION: 1. Mild tendinosis of the Achilles tendon without a tear.
2. High-grade partial-thickness cartilage loss of the medial aspect
of the talonavicular joint with subchondral marrow edema in the
talus.

## 2019-12-01 ENCOUNTER — Encounter: Payer: Self-pay | Admitting: Family Medicine

## 2019-12-01 ENCOUNTER — Other Ambulatory Visit: Payer: Self-pay

## 2019-12-01 ENCOUNTER — Ambulatory Visit: Payer: Medicare PPO | Admitting: Orthopaedic Surgery

## 2019-12-01 ENCOUNTER — Ambulatory Visit: Payer: Medicare PPO | Admitting: Family Medicine

## 2019-12-01 VITALS — BP 122/84 | HR 96 | Temp 97.8°F | Wt 186.6 lb

## 2019-12-01 DIAGNOSIS — Z03818 Encounter for observation for suspected exposure to other biological agents ruled out: Secondary | ICD-10-CM | POA: Diagnosis not present

## 2019-12-01 DIAGNOSIS — M791 Myalgia, unspecified site: Secondary | ICD-10-CM

## 2019-12-01 NOTE — Patient Instructions (Signed)
Take Advil 3 times per day  and you can also take 2 Tylenol 4 times per day for the aches and pains

## 2019-12-01 NOTE — Progress Notes (Signed)
   Subjective:    Patient ID: Ernest Ora., male    DOB: [birth year], 74 y.o.   MRN: [id number]  HPI He is he states that he did yard work last Friday and then developed difficulty with diffuse myalgias and slight nasal congestion but no fever, chills, cough, congestion, smell or taste change.  He is up-to-date on his Covid.  He does state that ibuprofen does help.   Review of Systems     Objective:   Physical Exam Alert and in no distress. Tympanic membranes and canals are normal. Pharyngeal area is normal. Neck is supple without adenopathy or thyromegaly. Cardiac exam shows a regular sinus rhythm without murmurs or gallops. Lungs are clear to auscultation.        Assessment & Plan:  Myalgia Continue 100 mg of ibuprofen 3 times per day.  Also encouraged him to get Covid tested in spite of him having had the immunizations.

## 2019-12-03 ENCOUNTER — Encounter: Payer: Self-pay | Admitting: Orthopaedic Surgery

## 2019-12-03 ENCOUNTER — Other Ambulatory Visit: Payer: Self-pay

## 2019-12-03 ENCOUNTER — Ambulatory Visit: Payer: Medicare PPO | Admitting: Orthopaedic Surgery

## 2019-12-03 DIAGNOSIS — M25571 Pain in right ankle and joints of right foot: Secondary | ICD-10-CM

## 2019-12-03 NOTE — Progress Notes (Signed)
The patient comes in today to go over the MRI of his right foot and ankle.  He is a 74 year old avid golfer.  He has been having pain more acute on the medial aspect of his ankle.  My working diagnosis was issues with the posterior tibial tendon.  He has slight flatfoot on his right and left sides.  His pain is been consistent on the medial aspect of his foot and ankle.  At this point we have tried activity modification.  I have him in a short cam walking boot and is trying Voltaren gel in this area.  He is still consistently tender over the medial aspect of the foot.  What is interesting is that the MRI does show significant edema in the head of the talus at the talonavicular joint.  This is certainly a stress response and likely arthritis at the talonavicular joint as well.  The ligamentous structures of his ankle are normal.  The posterior tibial tendon is normal.  I explained to him that the edema in the talus is like having a stress fracture.  I want him to continue the cam walking boot and Voltaren gel with activity modification and low impact types of activities.  He has seen Dr. Sharol Given in the past and he would like to see Dr. Sharol Given again to see if there is anything else that he would recommend treatment wise for his right foot and ankle.  I do feel it is worth him seeing Dr. Sharol Given as well.

## 2019-12-04 ENCOUNTER — Ambulatory Visit: Payer: Medicare PPO | Admitting: Orthopedic Surgery

## 2019-12-04 ENCOUNTER — Encounter: Payer: Self-pay | Admitting: Orthopedic Surgery

## 2019-12-04 VITALS — Ht 69.0 in | Wt 186.0 lb

## 2019-12-04 DIAGNOSIS — M76821 Posterior tibial tendinitis, right leg: Secondary | ICD-10-CM | POA: Diagnosis not present

## 2019-12-05 ENCOUNTER — Encounter: Payer: Self-pay | Admitting: Physical Therapy

## 2019-12-05 ENCOUNTER — Ambulatory Visit (INDEPENDENT_AMBULATORY_CARE_PROVIDER_SITE_OTHER): Payer: Medicare PPO | Admitting: Physical Therapy

## 2019-12-05 ENCOUNTER — Other Ambulatory Visit: Payer: Self-pay

## 2019-12-05 DIAGNOSIS — R2689 Other abnormalities of gait and mobility: Secondary | ICD-10-CM

## 2019-12-05 DIAGNOSIS — M6281 Muscle weakness (generalized): Secondary | ICD-10-CM | POA: Diagnosis not present

## 2019-12-05 DIAGNOSIS — M25671 Stiffness of right ankle, not elsewhere classified: Secondary | ICD-10-CM | POA: Diagnosis not present

## 2019-12-05 DIAGNOSIS — M25672 Stiffness of left ankle, not elsewhere classified: Secondary | ICD-10-CM

## 2019-12-05 DIAGNOSIS — M25571 Pain in right ankle and joints of right foot: Secondary | ICD-10-CM

## 2019-12-05 NOTE — Therapy (Signed)
[facility] Therapy [address] [place], [place], [address] Phone: [phone]   Fax:  [phone]  Physical Therapy Evaluation  Patient Details  Name: Ernest Hughes. MRN: [id number] Date of Birth: Dec 13, 1945 Referring Provider (PT):  Newt Minion, MD    Encounter Date: 12/05/2019  PT End of Session - 12/05/19 1329    Visit Number  1    Number of Visits  13    Date for PT Re-Evaluation  01/16/20    Authorization Type  MCR: Kx modat 15th visit    Progress Note Due on Visit  10    PT Start Time  1320   pt arrived 5 min late   PT Stop Time  1401    PT Time Calculation (min)  41 min    Activity Tolerance  Patient tolerated treatment well    Behavior During Therapy  [facility] for tasks assessed/performed       Past Medical History:  Diagnosis Date  . BPH (benign prostatic hyperplasia)   . Cancer ([place])    PROSTATE--treated with radiation  . Diverticulosis   . GERD (gastroesophageal reflux disease)   . Hypertension     Past Surgical History:  Procedure Laterality Date  . BELPHAROPTOSIS REPAIR  12/2013   bilateral  . cataract surgery Left 2019  . COLONOSCOPY     Dr. Sharlett Iles    There were no vitals filed for this visit.   Subjective Assessment - 12/05/19 1325    Subjective  pt is 74 y.o M with CC of R ankle pain that started in Feburary. He reports he started walking to help with the R knee and noted the pain gradually increased in the ankle with no specific MOI. pt current ambulates with a short cam boot.    Limitations  Walking;Standing    How long can you sit comfortably?  unlimited    How long can you stand comfortably?  10-15 min with boot on    How long can you walk comfortably?  10-15 min with boot on    Diagnostic tests  MRI - 12/03/2019 IMPRESSION:1. Mild tendinosis of the Achilles tendon without a tear.2. High-grade partial-thickness cartilage loss of the medial aspectof the talonavicular joint with subchondral marrow edema in  thetalus.    Patient Stated Goals  to decrease pain, return to playing golf    Currently in Pain?  Yes    Pain Score  5    last took medication for pain at 9:30   Pain Location  Ankle    Pain Orientation  Right;Medial;Posterior    Pain Descriptors / Indicators  Aching    Pain Type  Chronic pain    Pain Onset  More than a month ago    Pain Frequency  Constant    Aggravating Factors   standing/ walking, ascending/ descending stairs, prolonged sitting    Pain Relieving Factors  voltaren, tyelnol    Effect of Pain on Daily Activities  limited walking/ standing endurance         [facility] PT Assessment - 12/05/19 1330      Assessment   Medical Diagnosis  Posterior tibial tendon dysfunction (PTTD) of right lower extremity [id number]    Referring Provider (PT)   Newt Minion, MD     Onset Date/Surgical Date  09/24/19    Hand Dominance  Left    Next MD Visit  4 weeks    Prior Therapy  yes   R knee  Precautions   Precautions  None      Restrictions   Weight Bearing Restrictions  No      Balance Screen   Has the patient fallen in the past 6 months  No      [place] residence    Living Arrangements  Spouse/significant other    Available Help at Discharge  Family    Type of [place] to enter    Entrance Stairs-Number of Steps  5    Entrance Stairs-Rails  Can reach both    [place]  Two level    Alternate Level Stairs-Number of Steps  13      Prior Function   Level of [place]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   Overall Cognitive Status  Within Functional Limits for tasks assessed      ROM / Strength   AROM / PROM / Strength  AROM;Strength;PROM      AROM   AROM Assessment Site  Ankle    Right/Left Ankle  Right;Left    Right Ankle Dorsiflexion  8    Right Ankle Plantar Flexion  32   end range pain noted   Right Ankle Inversion  18    Right Ankle Eversion  8    Left Ankle  Dorsiflexion  10    Left Ankle Plantar Flexion  35    Left Ankle Inversion  18    Left Ankle Eversion  8      PROM   PROM Assessment Site  Ankle    Right/Left Ankle  Right    Right Ankle Plantar Flexion  35   pain noted during PROM     Strength   Strength Assessment Site  Ankle    Right/Left Ankle  Right;Left    Right Ankle Dorsiflexion  5/5   mild sorness noted during testing   Right Ankle Plantar Flexion  4/5   mild sorness noted during testing   Right Ankle Inversion  4/5   soreness during testing   Left Ankle Dorsiflexion  5/5    Left Ankle Plantar Flexion  5/5   in available ROM   Left Ankle Inversion  5/5    Left Ankle Eversion  5/5      Palpation   Palpation comment  TTP located along the posterior tib, and bil gastroc/ soleus complex with multiple tirgger points. along the talonavicular joint      Ambulation/Gait   Ambulation/Gait  Yes    Gait Pattern  Step-through pattern;Decreased stride length;Antalgic;Trendelenburg   R toe out pattern               Objective measurements completed on examination: See above findings.      [facility] PT Treatment/Exercise - 12/05/19 1330      Exercises   Exercises  Ankle      Ankle Exercises: Stretches   Gastroc Stretch  2 reps;30 seconds   RLE only     Ankle Exercises: Standing   Other Standing Ankle Exercises  heel strike/ toe off avoiding toe out pattern 2 x 30 ft      Ankle Exercises: Seated   Towel Crunch  --   1 x 15 cues for proper form   Other Seated Ankle Exercises  heel raise with towel squeeze 2 x 10  PT Education - 12/05/19 1502    Education Details  evaluation findings, POC, goals, HEp with proper form and rationale. efficient gait pattern / biomechanics.    Person(s) Educated  Patient    Methods  Explanation;Verbal cues;Handout    Comprehension  Verbalized understanding;Verbal cues required       PT Short Term Goals - 12/05/19 1511      PT SHORT TERM GOAL #1    Title  pt to be I with inital HEP    Time  3    Period  Weeks    Status  New    Target Date  12/26/19      PT SHORT TERM GOAL #2   Title  -        PT Long Term Goals - 12/05/19 1511      PT LONG TERM GOAL #1   Title  increase R ankle PF by >/= 6 degrees to promote toe off pattern and maximize gait efficiency    Baseline  -    Time  6    Period  Weeks    Status  New    Target Date  01/16/20      PT LONG TERM GOAL #2   Title  increase R ankle gross strength to >/= 4+/5 to promote ankle stability and arch support to reduce pain to </= 2/10 pain    Baseline  -    Time  6    Period  Weeks    Status  New    Target Date  01/16/20      PT LONG TERM GOAL #3   Title  pt to be able to stand and walk >/=45 min with </= 2/10 pain for functional endurance for community ambulation    Baseline  -    Time  6    Period  Weeks    Status  New    Target Date  01/16/20      PT LONG TERM GOAL #4   Title  pt to return to driving and putting at >/= 50% max effort with </= 2/10 pain for pt's personal goals of returning to playing golf.    Time  6    Period  Weeks    Status  New    Target Date  01/16/20      PT LONG TERM GOAL #5   Title  pt to be I with all HEP given as of last visit to maintain and progress current level of function    Time  6    Period  Weeks    Status  New    Target Date  01/16/20             Plan - 12/05/19 1503    Clinical Impression Statement  pt presents to OPPT with CC of R ankle/ foot pain starting back in [place] with no specific MOI. He demonstrates limited ankle PF with pain both passively/ actively suggesting more joint pathology. pt demonstrates TTP along the posterior tibialis, and gastroc/ soleus with multiple trigger points noted and the talonavicular joint. He would benefit from physical therapy to decrease R ankle/ foot pain, increase strength/ arch support and maximize gait mechanics and overall function by addressing the deficits listed.     Personal Factors and Comorbidities  Comorbidity 1;Age    Comorbidities  hx of Cx    Examination-Activity Limitations  Stand;Locomotion Level    Stability/Clinical Decision Making  Evolving/Moderate complexity    Clinical Decision Making  Moderate    Rehab Potential  Good    PT Frequency  2x / week    PT Duration  6 weeks    PT Treatment/Interventions  ADLs/Self Care Home Management;Cryotherapy;Electrical Stimulation;Iontophoresis 4mg /ml Dexamethasone;Moist Heat;Ultrasound;Stair training;Therapeutic activities;Therapeutic exercise;Neuromuscular re-education;Balance training;Patient/family education;Manual techniques;Dry needling;Taping;Passive range of motion    PT Next Visit Plan  review/ update HEP PRN, ankle mobs to promote PF, ankle instrinsics strengtheing, gait trianing, trial arch taping,    PT Home Exercise Plan  [id number]    Consulted and Agree with Plan of Care  Patient       Patient will benefit from skilled therapeutic intervention in order to improve the following deficits and impairments:  Improper body mechanics, Increased muscle spasms, Decreased strength, Abnormal gait, Postural dysfunction, Pain, Decreased activity tolerance, Decreased endurance, Decreased range of motion  Visit Diagnosis: Pain in right ankle and joints of right foot  Stiffness of right ankle, not elsewhere classified  Stiffness of left ankle, not elsewhere classified  Muscle weakness (generalized)  Other abnormalities of gait and mobility     Problem List Patient Active Problem List   Diagnosis Date Noted  . Cataract of left eye 04/28/2019  . Erectile dysfunction 04/28/2019  . Onychomycosis 05/05/2014  . History of prostate cancer 05/22/2011  . GERD 02/02/2009  . HYPERCHOLESTEROLEMIA 02/01/2009  . Essential hypertension 02/01/2009  . Diverticulosis of colon 02/01/2009    Starr [place] PT, DPT, LAT, ATC  12/05/19  3:23 PM      [facility] [address] [place], [place], [address] Phone: [phone]   Fax:  [phone]  Name: Ernest Hughes. MRN: [id number] Date of Birth: 1946-01-27

## 2019-12-08 ENCOUNTER — Encounter: Payer: Self-pay | Admitting: Orthopedic Surgery

## 2019-12-08 ENCOUNTER — Encounter: Payer: Self-pay | Admitting: Rehabilitative and Restorative Service Providers"

## 2019-12-08 ENCOUNTER — Other Ambulatory Visit: Payer: Self-pay

## 2019-12-08 ENCOUNTER — Ambulatory Visit: Payer: Medicare PPO | Admitting: Rehabilitative and Restorative Service Providers"

## 2019-12-08 DIAGNOSIS — R2689 Other abnormalities of gait and mobility: Secondary | ICD-10-CM | POA: Diagnosis not present

## 2019-12-08 DIAGNOSIS — M25672 Stiffness of left ankle, not elsewhere classified: Secondary | ICD-10-CM | POA: Diagnosis not present

## 2019-12-08 DIAGNOSIS — M6281 Muscle weakness (generalized): Secondary | ICD-10-CM

## 2019-12-08 DIAGNOSIS — M25571 Pain in right ankle and joints of right foot: Secondary | ICD-10-CM | POA: Diagnosis not present

## 2019-12-08 DIAGNOSIS — M25561 Pain in right knee: Secondary | ICD-10-CM | POA: Diagnosis not present

## 2019-12-08 DIAGNOSIS — M25671 Stiffness of right ankle, not elsewhere classified: Secondary | ICD-10-CM

## 2019-12-08 DIAGNOSIS — G8929 Other chronic pain: Secondary | ICD-10-CM

## 2019-12-08 NOTE — Progress Notes (Signed)
Office Visit Note   Patient: Ernest Hughes.           Date of Birth: 1945-10-02           MRN: [id number] Visit Date: 12/04/2019              Requested by: Denita Lung, MD [address] [place],  [place] [address] PCP: Denita Lung, MD  Chief Complaint  Patient presents with  . Right Foot - Pain  . Right Ankle - Pain      HPI: Patient is a 74 year old gentleman who is seen in referral from Dr. Ninfa Linden status post MRI scan of the right ankle.  Patient states he is active with golf and has been having ankle pain with pain on uneven terrain.  Assessment & Plan: Visit Diagnoses:  1. Posterior tibial tendon dysfunction (PTTD) of right lower extremity     Plan: Recommended physical therapy for strengthening of the posterior tibial tendon, sole orthotics with a stiff soled shoe to further unload the posterior tibial tendon.  Follow-Up Instructions: Return in about 4 weeks (around 01/01/2020).   Ortho Exam  Patient is alert, oriented, no adenopathy, well-dressed, normal affect, normal respiratory effort. Examination patient has good pulses radiograph shows normal ankle anatomy.  Patient has a pronated valgus hindfoot with pes planus on the right with out valgus or pronation on the left with a better arch on the left.  Patient does have tenderness to palpation over the posterior tibial tendon.  Patient has pain with a single limb heel raise on the right.  Review of the MRI scan shows some mild tendinosis of the Achilles and shows cartilage degradation of the talonavicular joint with marrow edema in the talus.  Imaging: No results found. No images are attached to the encounter.  Labs: No results found for: HGBA1C, ESRSEDRATE, CRP, LABURIC, REPTSTATUS, GRAMSTAIN, CULT, LABORGA   Lab Results  Component Value Date   ALBUMIN 4.0 04/28/2019   ALBUMIN 3.8 12/12/2017   ALBUMIN 3.7 09/20/2016    No results found for: MG No results found for: VD25OH  No results  found for: PREALBUMIN CBC EXTENDED Latest Ref Rng & Units 04/28/2019 12/12/2017 09/20/2016  WBC 3.4 - 10.8 x10E3/uL 4.9 4.6 3.8(L)  RBC 4.14 - 5.80 x10E6/uL 4.97 4.92 4.85  HGB 13.0 - 17.7 g/dL 14.6 13.7 13.7  HCT 37.5 - 51.0 % 43.0 43.0 42.2  PLT 150 - 450 x10E3/uL 183 192 183  NEUTROABS 1.4 - 7.0 x10E3/uL 2.6 2.0 1,710  LYMPHSABS 0.7 - 3.1 x10E3/uL 2.0 2.2 1,596     Body mass index is 27.47 kg/m.  Orders:  Orders Placed This Encounter  Procedures  . Ambulatory referral to Physical Therapy   No orders of the defined types were placed in this encounter.    Procedures: No procedures performed  Clinical Data: No additional findings.  ROS:  All other systems negative, except as noted in the HPI. Review of Systems  Objective: Vital Signs: Ht 5\' 9"  (1.753 m)   Wt 186 lb (84.4 kg)   BMI 27.47 kg/m   Specialty Comments:  No specialty comments available.  PMFS History: Patient Active Problem List   Diagnosis Date Noted  . Cataract of left eye 04/28/2019  . Erectile dysfunction 04/28/2019  . Onychomycosis 05/05/2014  . History of prostate cancer 05/22/2011  . GERD 02/02/2009  . HYPERCHOLESTEROLEMIA 02/01/2009  . Essential hypertension 02/01/2009  . Diverticulosis of colon 02/01/2009   Past Medical History:  Diagnosis  Date  . BPH (benign prostatic hyperplasia)   . Cancer ([place])    PROSTATE--treated with radiation  . Diverticulosis   . GERD (gastroesophageal reflux disease)   . Hypertension     Family History  Problem Relation Age of Onset  . Cancer Mother   . Cancer Father   . Heart disease Sister   . Cancer Maternal Aunt   . Cancer Paternal Aunt   . Cancer Paternal Grandfather     Past Surgical History:  Procedure Laterality Date  . BELPHAROPTOSIS REPAIR  12/2013   bilateral  . cataract surgery Left 2019  . COLONOSCOPY     Dr. Sharlett Iles   Social History   Occupational History  . Not on file  Tobacco Use  . Smoking status: Never Smoker  .  Smokeless tobacco: Never Used  Substance and Sexual Activity  . Alcohol use: Yes    Comment: has some wine once in a while   . Drug use: No  . Sexual activity: Not Currently

## 2019-12-08 NOTE — Therapy (Signed)
[facility] Physical Therapy [address] [place], [place], [address] Phone: [phone]   Fax:  [phone]  Physical Therapy Treatment  Patient Details  Name: Ernest Hughes. MRN: [id number] Date of Birth: 11-09-1945 Referring Provider (PT):  Newt Minion, MD    Encounter Date: 12/08/2019  PT End of Session - 12/08/19 1507    Visit Number  2    Number of Visits  13    PT Start Time  [id number]    PT Stop Time  1400    PT Time Calculation (min)  45 min    Activity Tolerance  Patient tolerated treatment well    Behavior During Therapy  [facility] for tasks assessed/performed       Past Medical History:  Diagnosis Date  . BPH (benign prostatic hyperplasia)   . Cancer ([place])    PROSTATE--treated with radiation  . Diverticulosis   . GERD (gastroesophageal reflux disease)   . Hypertension     Past Surgical History:  Procedure Laterality Date  . BELPHAROPTOSIS REPAIR  12/2013   bilateral  . cataract surgery Left 2019  . COLONOSCOPY     Dr. Sharlett Iles    There were no vitals filed for this visit.  Subjective Assessment - 12/08/19 1501    Subjective  Ndrew reports compliance with his starter HEP.  He is limited with his standing and walking due to his medial foot and ankle pain.    Limitations  Walking;Standing    How long can you sit comfortably?  unlimited    How long can you stand comfortably?  10-15 min with boot on    How long can you walk comfortably?  10-15 min with boot on    Diagnostic tests  MRI - 12/03/2019 IMPRESSION:1. Mild tendinosis of the Achilles tendon without a tear.2. High-grade partial-thickness cartilage loss of the medial aspectof the talonavicular joint with subchondral marrow edema in thetalus.    Patient Stated Goals  to decrease pain, return to playing golf    Pain Score  5     Pain Location  Ankle    Pain Orientation  Right;Medial;Posterior    Pain Onset  More than a month ago                       [facility] Adult PT  Treatment/Exercise - 12/08/19 0001      Exercises   Exercises  Ankle      Modalities   Modalities  Cryotherapy      Cryotherapy   Number Minutes Cryotherapy  10 Minutes    Cryotherapy Location  Ankle    Type of Cryotherapy  Ice pack      Ankle Exercises: Stretches   Soleus Stretch  4 reps;20 seconds    Gastroc Stretch  4 reps;20 seconds    Slant Board Stretch  3 reps;60 seconds;Other (comment)   Upper (Gastroc) & Lower (Soleus) 3X each     Ankle Exercises: Standing   Heel Raises  10 reps;Other (comment)   3 seconds with slow eccentrics     Ankle Exercises: Supine   T-Band  Inversion 2 sets of 10 Red; Eversion 1 set of 10 Red             PT Education - 12/08/19 1507    Education Details  Corrected HEP and discussed proper footwear with good medial arch support    Person(s) Educated  Patient    Methods  Explanation;Demonstration;Verbal cues;Handout    Comprehension  Verbalized understanding;Returned demonstration;Need further instruction;Verbal cues required       PT Short Term Goals - 12/05/19 1511      PT SHORT TERM GOAL #1   Title  pt to be I with inital HEP    Time  3    Period  Weeks    Status  New    Target Date  12/26/19      PT SHORT TERM GOAL #2   Title  -        PT Long Term Goals - 12/05/19 1511      PT LONG TERM GOAL #1   Title  increase R ankle PF by >/= 6 degrees to promote toe off pattern and maximize gait efficiency    Baseline  -    Time  6    Period  Weeks    Status  New    Target Date  01/16/20      PT LONG TERM GOAL #2   Title  increase R ankle gross strength to >/= 4+/5 to promote ankle stability and arch support to reduce pain to </= 2/10 pain    Baseline  -    Time  6    Period  Weeks    Status  New    Target Date  01/16/20      PT LONG TERM GOAL #3   Title  pt to be able to stand and walk >/=45 min with </= 2/10 pain for functional endurance for community ambulation    Baseline  -    Time  6    Period  Weeks     Status  New    Target Date  01/16/20      PT LONG TERM GOAL #4   Title  pt to return to driving and putting at >/= 50% max effort with </= 2/10 pain for pt's personal goals of returning to playing golf.    Time  6    Period  Weeks    Status  New    Target Date  01/16/20      PT LONG TERM GOAL #5   Title  pt to be I with all HEP given as of last visit to maintain and progress current level of function    Time  6    Period  Weeks    Status  New    Target Date  01/16/20            Plan - 12/08/19 1508    Clinical Impression Statement  [place] is very motivated to return to his daily 2-3 mile walks as his ankle is keeping from doing this now.  Stairs are also limiting.  Heel cords stretching and ankle (particularly inversion and plantar flexion) strengthening remain the focus of his HEP.    PT Next Visit Plan  Continue medial and posterior ankle strengthening along with Achilles stretching.       Patient will benefit from skilled therapeutic intervention in order to improve the following deficits and impairments:     Visit Diagnosis: Pain in right ankle and joints of right foot  Stiffness of right ankle, not elsewhere classified  Stiffness of left ankle, not elsewhere classified  Muscle weakness (generalized)  Other abnormalities of gait and mobility  Chronic pain of right knee     Problem List Patient Active Problem List   Diagnosis Date Noted  . Cataract of left eye 04/28/2019  . Erectile dysfunction 04/28/2019  . Onychomycosis 05/05/2014  .  History of prostate cancer 05/22/2011  . GERD 02/02/2009  . HYPERCHOLESTEROLEMIA 02/01/2009  . Essential hypertension 02/01/2009  . Diverticulosis of colon 02/01/2009    Farley Ly MPT 12/08/2019, 3:11 PM  [facility] Physical Therapy [address] [place], [place], [address] Phone: [phone]   Fax:  [phone]  Name: Jvion Miazga. MRN: [id number] Date of Birth: 06-Feb-1946

## 2019-12-10 ENCOUNTER — Ambulatory Visit: Payer: Medicare PPO | Admitting: Rehabilitative and Restorative Service Providers"

## 2019-12-10 ENCOUNTER — Other Ambulatory Visit: Payer: Self-pay

## 2019-12-10 ENCOUNTER — Encounter: Payer: Self-pay | Admitting: Rehabilitative and Restorative Service Providers"

## 2019-12-10 DIAGNOSIS — M25671 Stiffness of right ankle, not elsewhere classified: Secondary | ICD-10-CM | POA: Diagnosis not present

## 2019-12-10 DIAGNOSIS — M6281 Muscle weakness (generalized): Secondary | ICD-10-CM | POA: Diagnosis not present

## 2019-12-10 DIAGNOSIS — M25571 Pain in right ankle and joints of right foot: Secondary | ICD-10-CM

## 2019-12-10 DIAGNOSIS — M25672 Stiffness of left ankle, not elsewhere classified: Secondary | ICD-10-CM | POA: Diagnosis not present

## 2019-12-10 DIAGNOSIS — R2689 Other abnormalities of gait and mobility: Secondary | ICD-10-CM | POA: Diagnosis not present

## 2019-12-10 NOTE — Therapy (Signed)
[facility] [address] [place], [place], [address] Phone: [phone]   Fax:  [phone]  Physical Therapy Treatment  Patient Details  Name: Ernest Hughes. MRN: [id number] Date of Birth: Jul 26, 1946 Referring Provider (PT):  Newt Minion, MD    Encounter Date: 12/10/2019  PT End of Session - 12/10/19 1402    Visit Number  3    Number of Visits  13    Date for PT Re-Evaluation  01/16/20    Authorization Type  MCR: Kx modat 15th visit    Progress Note Due on Visit  10    PT Start Time  1401    PT Stop Time  1445    PT Time Calculation (min)  44 min    Activity Tolerance  Patient tolerated treatment well    Behavior During Therapy  [facility] for tasks assessed/performed       Past Medical History:  Diagnosis Date  . BPH (benign prostatic hyperplasia)   . Cancer ([place])    PROSTATE--treated with radiation  . Diverticulosis   . GERD (gastroesophageal reflux disease)   . Hypertension     Past Surgical History:  Procedure Laterality Date  . BELPHAROPTOSIS REPAIR  12/2013   bilateral  . cataract surgery Left 2019  . COLONOSCOPY     Dr. Sharlett Iles    There were no vitals filed for this visit.  Subjective Assessment - 12/10/19 1421    Subjective  Ernest Hughes indicated feeling minimal complaints of pain since last visit.  No soreness noted after visit.    Limitations  Walking;Standing    How long can you sit comfortably?  unlimited    How long can you stand comfortably?  10-15 min with boot on    How long can you walk comfortably?  10-15 min with boot on    Diagnostic tests  MRI - 12/03/2019 IMPRESSION:1. Mild tendinosis of the Achilles tendon without a tear.2. High-grade partial-thickness cartilage loss of the medial aspectof the talonavicular joint with subchondral marrow edema in thetalus.    Patient Stated Goals  to decrease pain, return to playing golf    Currently in Pain?  Yes    Pain Location  Foot    Pain Orientation  Right;Medial     Pain Descriptors / Indicators  Aching    Pain Onset  More than a month ago    Pain Frequency  Intermittent    Aggravating Factors   Prolonged standing/walking    Pain Relieving Factors  gel                       [facility] Adult PT Treatment/Exercise - 12/10/19 0001      Neuro Re-ed    Neuro Re-ed Details   SLS c medial arch integrity focus 15 sec x 5 c finger tip assist required      Modalities   Modalities  Vasopneumatic      Vasopneumatic   Number Minutes Vasopneumatic   5 minutes    Vasopnuematic Location   Ankle    Vasopneumatic Pressure  Medium    Vasopneumatic Temperature   34 deg      Manual Therapy   Manual therapy comments  g3-g4 ap talocrural jt mobs, medial/lateral sub talar jt mobs      Ankle Exercises: Supine   T-Band  eccentric PF/inversion 3 x 10      Ankle Exercises: Stretches   Soleus Stretch  3 reps;30 seconds   Rt LE  posteroir   Gastroc Stretch  3 reps;30 seconds   Rt LE posterior     Ankle Exercises: Aerobic   Stationary Bike  Lvl 3 6 mins      Ankle Exercises: Standing   Heel Raises  Both   2 x 10 eccentric focus performed DL            PT Education - 12/10/19 1424    Education Details  Intervention cues for procedures    Person(s) Educated  Patient    Methods  Explanation;Demonstration;Verbal cues    Comprehension  Verbalized understanding;Returned demonstration       PT Short Term Goals - 12/05/19 1511      PT SHORT TERM GOAL #1   Title  pt to be I with inital HEP    Time  3    Period  Weeks    Status  New    Target Date  12/26/19      PT SHORT TERM GOAL #2   Title  -        PT Long Term Goals - 12/10/19 1425      PT LONG TERM GOAL #1   Title  increase R ankle PF by >/= 6 degrees to promote toe off pattern and maximize gait efficiency    Baseline  -    Time  6    Period  Weeks    Status  On-going      PT LONG TERM GOAL #2   Title  increase R ankle gross strength to >/= 4+/5 to promote ankle  stability and arch support to reduce pain to </= 2/10 pain    Baseline  -    Time  6    Period  Weeks    Status  On-going      PT LONG TERM GOAL #3   Title  pt to be able to stand and walk >/=45 min with </= 2/10 pain for functional endurance for community ambulation    Baseline  -    Time  6    Period  Weeks    Status  On-going      PT LONG TERM GOAL #4   Title  pt to return to driving and putting at >/= 50% max effort with </= 2/10 pain for pt's personal goals of returning to playing golf.    Time  6    Period  Weeks    Status  On-going      PT LONG TERM GOAL #5   Title  pt to be I with all HEP given as of last visit to maintain and progress current level of function    Time  6    Period  Weeks    Status  On-going            Plan - 12/10/19 1425    Clinical Impression Statement  Reduced pain symptoms overall c daily activity indicated.  Fair performance on SLS activity warranting continued improvement for heel/foot positioning in ambulation.  Continued strengthening indicated c skilled PT services.    Personal Factors and Comorbidities  Comorbidity 1;Age    Comorbidities  hx of Cx    Examination-Activity Limitations  Stand;Locomotion Level    Rehab Potential  Good    PT Frequency  2x / week    PT Duration  6 weeks    PT Treatment/Interventions  ADLs/Self Care Home Management;Cryotherapy;Electrical Stimulation;Iontophoresis 4mg /ml Dexamethasone;Moist Heat;Ultrasound;Stair training;Therapeutic activities;Therapeutic exercise;Neuromuscular re-education;Balance training;Patient/family education;Manual techniques;Dry needling;Taping;Passive range of motion  PT Next Visit Plan  Manual for DF mobility, movement coordination in [facility]    PT Home Exercise Plan  [id number]    Consulted and Agree with Plan of Care  Patient       Patient will benefit from skilled therapeutic intervention in order to improve the following deficits and impairments:  Improper body mechanics, Increased  muscle spasms, Decreased strength, Abnormal gait, Postural dysfunction, Pain, Decreased activity tolerance, Decreased endurance, Decreased range of motion  Visit Diagnosis: Pain in right ankle and joints of right foot  Stiffness of right ankle, not elsewhere classified  Stiffness of left ankle, not elsewhere classified  Muscle weakness (generalized)  Other abnormalities of gait and mobility     Problem List Patient Active Problem List   Diagnosis Date Noted  . Cataract of left eye 04/28/2019  . Erectile dysfunction 04/28/2019  . Onychomycosis 05/05/2014  . History of prostate cancer 05/22/2011  . GERD 02/02/2009  . HYPERCHOLESTEROLEMIA 02/01/2009  . Essential hypertension 02/01/2009  . Diverticulosis of colon 02/01/2009    [name], PT, DPT, OCS, ATC 12/10/19  2:47 PM    [facility] Therapy [address] [place], [place], [address] Phone: [phone]   Fax:  [phone]  Name: Ernest Hughes. MRN: [id number] Date of Birth: 04/12/1946

## 2019-12-15 ENCOUNTER — Encounter: Payer: Self-pay | Admitting: Rehabilitative and Restorative Service Providers"

## 2019-12-15 ENCOUNTER — Other Ambulatory Visit: Payer: Self-pay

## 2019-12-15 ENCOUNTER — Ambulatory Visit: Payer: Medicare PPO | Admitting: Rehabilitative and Restorative Service Providers"

## 2019-12-15 DIAGNOSIS — M25561 Pain in right knee: Secondary | ICD-10-CM | POA: Diagnosis not present

## 2019-12-15 DIAGNOSIS — M6281 Muscle weakness (generalized): Secondary | ICD-10-CM

## 2019-12-15 DIAGNOSIS — M25671 Stiffness of right ankle, not elsewhere classified: Secondary | ICD-10-CM | POA: Diagnosis not present

## 2019-12-15 DIAGNOSIS — M25672 Stiffness of left ankle, not elsewhere classified: Secondary | ICD-10-CM | POA: Diagnosis not present

## 2019-12-15 DIAGNOSIS — M25571 Pain in right ankle and joints of right foot: Secondary | ICD-10-CM

## 2019-12-15 DIAGNOSIS — G8929 Other chronic pain: Secondary | ICD-10-CM

## 2019-12-15 DIAGNOSIS — R2689 Other abnormalities of gait and mobility: Secondary | ICD-10-CM | POA: Diagnosis not present

## 2019-12-15 NOTE — Therapy (Signed)
[facility] Physical Therapy [address] [place], [place], [address] Phone: [phone]   Fax:  [phone]  Physical Therapy Treatment  Patient Details  Name: Ernest Hughes. MRN: [id number] Date of Birth: Feb 19, 1946 Referring Provider (PT):  Newt Minion, MD    Encounter Date: 12/15/2019  PT End of Session - 12/15/19 1151    Visit Number  4    Number of Visits  13    Date for PT Re-Evaluation  01/16/20    Authorization Type  MCR: Kx modat 15th visit    Progress Note Due on Visit  10    PT Start Time  [id number]    PT Stop Time  1228    PT Time Calculation (min)  40 min    Activity Tolerance  Patient tolerated treatment well    Behavior During Therapy  [facility] for tasks assessed/performed       Past Medical History:  Diagnosis Date  . BPH (benign prostatic hyperplasia)   . Cancer ([place])    PROSTATE--treated with radiation  . Diverticulosis   . GERD (gastroesophageal reflux disease)   . Hypertension     Past Surgical History:  Procedure Laterality Date  . BELPHAROPTOSIS REPAIR  12/2013   bilateral  . cataract surgery Left 2019  . COLONOSCOPY     Dr. Sharlett Iles    There were no vitals filed for this visit.  Subjective Assessment - 12/15/19 1150    Subjective  Pt. indicated moderate pain at worst to report since last visit c walking and standing.  Pt. state feeling it isn't normal yet but feels some improvement.    Limitations  Walking;Standing    How long can you sit comfortably?  unlimited    How long can you stand comfortably?  10-15 min with boot on    How long can you walk comfortably?  10-15 min with boot on    Diagnostic tests  MRI - 12/03/2019 IMPRESSION:1. Mild tendinosis of the Achilles tendon without a tear.2. High-grade partial-thickness cartilage loss of the medial aspectof the talonavicular joint with subchondral marrow edema in thetalus.    Patient Stated Goals  to decrease pain, return to playing golf    Currently in Pain?  Other  (Comment)   reporting pain at worst since last visit.   Pain Score  5     Pain Location  --   posterior ankle   Pain Orientation  Right    Pain Descriptors / Indicators  Aching    Pain Onset  More than a month ago    Pain Frequency  Intermittent    Aggravating Factors   initial walking, stairs.                       [facility] Adult PT Treatment/Exercise - 12/15/19 0001      Neuro Re-ed    Neuro Re-ed Details   SLS c cone touches 3 anterior x 10 bilateral, tandem stance on foam 30 sec x 4 bilateral      Ankle Exercises: Aerobic   Nustep  lvl 6 10 min      Ankle Exercises: Stretches   Soleus Stretch  3 reps;30 seconds   incline board   Gastroc Stretch  3 reps;30 seconds   runner stretch on incline     Ankle Exercises: Standing   Heel Raises  Right   eccentric  x 10 (pain noted)     Ankle Exercises: Seated   Other Seated Ankle  Exercises  PF/inversion eccentric 20 x blue band               PT Short Term Goals - 12/05/19 1511      PT SHORT TERM GOAL #1   Title  pt to be I with inital HEP    Time  3    Period  Weeks    Status  New    Target Date  12/26/19      PT SHORT TERM GOAL #2   Title  -        PT Long Term Goals - 12/10/19 1425      PT LONG TERM GOAL #1   Title  increase R ankle PF by >/= 6 degrees to promote toe off pattern and maximize gait efficiency    Baseline  -    Time  6    Period  Weeks    Status  On-going      PT LONG TERM GOAL #2   Title  increase R ankle gross strength to >/= 4+/5 to promote ankle stability and arch support to reduce pain to </= 2/10 pain    Baseline  -    Time  6    Period  Weeks    Status  On-going      PT LONG TERM GOAL #3   Title  pt to be able to stand and walk >/=45 min with </= 2/10 pain for functional endurance for community ambulation    Baseline  -    Time  6    Period  Weeks    Status  On-going      PT LONG TERM GOAL #4   Title  pt to return to driving and putting at >/= 50% max  effort with </= 2/10 pain for pt's personal goals of returning to playing golf.    Time  6    Period  Weeks    Status  On-going      PT LONG TERM GOAL #5   Title  pt to be I with all HEP given as of last visit to maintain and progress current level of function    Time  6    Period  Weeks    Status  On-going            Plan - 12/15/19 1217    Clinical Impression Statement  Fair performance continued on movement coordination intervention today, showing necessity for continued improvement to improve stabity and balance control.    Personal Factors and Comorbidities  Comorbidity 1;Age    Comorbidities  hx of Cx    Examination-Activity Limitations  Stand;Locomotion Level    Rehab Potential  Good    PT Frequency  2x / week    PT Duration  6 weeks    PT Treatment/Interventions  ADLs/Self Care Home Management;Cryotherapy;Electrical Stimulation;Iontophoresis 4mg /ml Dexamethasone;Moist Heat;Ultrasound;Stair training;Therapeutic activities;Therapeutic exercise;Neuromuscular re-education;Balance training;Patient/family education;Manual techniques;Dry needling;Taping;Passive range of motion    PT Next Visit Plan  Continue to improve balance, eccentric loading for PF/Inversion    PT Home Exercise Plan  [id number]    Consulted and Agree with Plan of Care  Patient       Patient will benefit from skilled therapeutic intervention in order to improve the following deficits and impairments:  Improper body mechanics, Increased muscle spasms, Decreased strength, Abnormal gait, Postural dysfunction, Pain, Decreased activity tolerance, Decreased endurance, Decreased range of motion  Visit Diagnosis: Pain in right ankle and joints of right foot  Stiffness of  right ankle, not elsewhere classified  Stiffness of left ankle, not elsewhere classified  Muscle weakness (generalized)  Other abnormalities of gait and mobility  Chronic pain of right knee     Problem List Patient Active Problem List    Diagnosis Date Noted  . Cataract of left eye 04/28/2019  . Erectile dysfunction 04/28/2019  . Onychomycosis 05/05/2014  . History of prostate cancer 05/22/2011  . GERD 02/02/2009  . HYPERCHOLESTEROLEMIA 02/01/2009  . Essential hypertension 02/01/2009  . Diverticulosis of colon 02/01/2009   [name], PT, DPT, OCS, ATC 12/15/19  12:30 PM    [facility] [address] [place], [place], [address] Phone: [phone]   Fax:  [phone]  Name: Ernest Hughes. MRN: [id number] Date of Birth: 1946/01/26

## 2019-12-17 ENCOUNTER — Encounter: Payer: Self-pay | Admitting: Rehabilitative and Restorative Service Providers"

## 2019-12-17 ENCOUNTER — Other Ambulatory Visit: Payer: Self-pay

## 2019-12-17 ENCOUNTER — Ambulatory Visit (INDEPENDENT_AMBULATORY_CARE_PROVIDER_SITE_OTHER): Payer: Medicare PPO | Admitting: Rehabilitative and Restorative Service Providers"

## 2019-12-17 DIAGNOSIS — M25571 Pain in right ankle and joints of right foot: Secondary | ICD-10-CM | POA: Diagnosis not present

## 2019-12-17 DIAGNOSIS — M25561 Pain in right knee: Secondary | ICD-10-CM | POA: Diagnosis not present

## 2019-12-17 DIAGNOSIS — M25672 Stiffness of left ankle, not elsewhere classified: Secondary | ICD-10-CM

## 2019-12-17 DIAGNOSIS — R2689 Other abnormalities of gait and mobility: Secondary | ICD-10-CM

## 2019-12-17 DIAGNOSIS — M25671 Stiffness of right ankle, not elsewhere classified: Secondary | ICD-10-CM

## 2019-12-17 DIAGNOSIS — G8929 Other chronic pain: Secondary | ICD-10-CM

## 2019-12-17 DIAGNOSIS — M6281 Muscle weakness (generalized): Secondary | ICD-10-CM | POA: Diagnosis not present

## 2019-12-17 NOTE — Therapy (Signed)
[facility] Physical Therapy [address] [place], [place], [address] Phone: [phone]   Fax:  [phone]  Physical Therapy Treatment  Patient Details  Name: Ernest Hughes. MRN: [id number] Date of Birth: [birth year] Referring Provider (PT):  Newt Minion, MD    Encounter Date: 12/17/2019  PT End of Session - 12/17/19 1157    Visit Number  5    Number of Visits  13    Date for PT Re-Evaluation  01/16/20    Authorization Type  MCR: Kx modat 15th visit    Progress Note Due on Visit  10    PT Start Time  [id number]    PT Stop Time  1234    PT Time Calculation (min)  49 min    Activity Tolerance  Patient tolerated treatment well    Behavior During Therapy  [facility] for tasks assessed/performed       Past Medical History:  Diagnosis Date  . BPH (benign prostatic hyperplasia)   . Cancer ([place])    PROSTATE--treated with radiation  . Diverticulosis   . GERD (gastroesophageal reflux disease)   . Hypertension     Past Surgical History:  Procedure Laterality Date  . BELPHAROPTOSIS REPAIR  12/2013   bilateral  . cataract surgery Left 2019  . COLONOSCOPY     Dr. Sharlett Iles    There were no vitals filed for this visit.  Subjective Assessment - 12/17/19 1154    Subjective  Pt. indicated feeling some soreness today and yesterday after doing some of the workout. Pt. indicated the complaints in middle of night and morning after getting out of bed .    Limitations  Walking;Standing    How long can you sit comfortably?  unlimited    How long can you stand comfortably?  10-15 min with boot on    How long can you walk comfortably?  10-15 min with boot on    Diagnostic tests  MRI - 12/03/2019 IMPRESSION:1. Mild tendinosis of the Achilles tendon without a tear.2. High-grade partial-thickness cartilage loss of the medial aspectof the talonavicular joint with subchondral marrow edema in thetalus.    Patient Stated Goals  to decrease pain, return to playing golf    Currently  in Pain?  Yes    Pain Score  3     Pain Location  Foot   foot/ankle   Pain Orientation  Right    Pain Descriptors / Indicators  Aching;Tightness    Pain Type  Chronic pain    Pain Onset  More than a month ago    Pain Frequency  Intermittent    Aggravating Factors   initial walking after lying down/sitting prolonged.    Pain Relieving Factors  rest, gentle stretching                       [facility] Adult PT Treatment/Exercise - 12/17/19 0001      Neuro Re-ed    Neuro Re-ed Details   tandem stance on foam 30 sec x 5 bilateral SLS on airex foam Rt LE 30 sec x 6      Vasopneumatic   Number Minutes Vasopneumatic   8 minutes    Vasopnuematic Location   Ankle    Vasopneumatic Pressure  Medium    Vasopneumatic Temperature   34 deg      Manual Therapy   Manual therapy comments  g4 ap talocrural jt mobs      Ankle Exercises: Stretches   Gastroc  Stretch  3 reps;30 seconds      Ankle Exercises: Aerobic   Nustep  lvl 6 10 min      Ankle Exercises: Standing   BAPS  Sitting;Level 2   30x df/pf, circles clockwise/counter clockwise 30 x each     Ankle Exercises: Seated   Other Seated Ankle Exercises  PF/inversion eccentric 30 x blue band             PT Education - 12/17/19 1156    Education Details  [place] sock option for DF in sleeping to improve mobility.    Person(s) Educated  Patient    Methods  Explanation;Demonstration    Comprehension  Verbalized understanding       PT Short Term Goals - 12/05/19 1511      PT SHORT TERM GOAL #1   Title  pt to be I with inital HEP    Time  3    Period  Weeks    Status  New    Target Date  12/26/19      PT SHORT TERM GOAL #2   Title  -        PT Long Term Goals - 12/10/19 1425      PT LONG TERM GOAL #1   Title  increase R ankle PF by >/= 6 degrees to promote toe off pattern and maximize gait efficiency    Baseline  -    Time  6    Period  Weeks    Status  On-going      PT LONG TERM GOAL #2   Title   increase R ankle gross strength to >/= 4+/5 to promote ankle stability and arch support to reduce pain to </= 2/10 pain    Baseline  -    Time  6    Period  Weeks    Status  On-going      PT LONG TERM GOAL #3   Title  pt to be able to stand and walk >/=45 min with </= 2/10 pain for functional endurance for community ambulation    Baseline  -    Time  6    Period  Weeks    Status  On-going      PT LONG TERM GOAL #4   Title  pt to return to driving and putting at >/= 50% max effort with </= 2/10 pain for pt's personal goals of returning to playing golf.    Time  6    Period  Weeks    Status  On-going      PT LONG TERM GOAL #5   Title  pt to be I with all HEP given as of last visit to maintain and progress current level of function    Time  6    Period  Weeks    Status  On-going            Plan - 12/17/19 1213    Clinical Impression Statement  Inversion control lacking in tandem, single leg stance activity, resulting in pes planus loading and possible posterior tibial stretching load in WB.  To address standing after sleeping symptoms, possible use of DF based sock for stretching throughout night may be beneficial.    Personal Factors and Comorbidities  Comorbidity 1;Age    Comorbidities  hx of Cx    Examination-Activity Limitations  Stand;Locomotion Level    Rehab Potential  Good    PT Frequency  2x / week    PT Duration  6 weeks    PT Treatment/Interventions  ADLs/Self Care Home Management;Cryotherapy;Electrical Stimulation;Iontophoresis 4mg /ml Dexamethasone;Moist Heat;Ultrasound;Stair training;Therapeutic activities;Therapeutic exercise;Neuromuscular re-education;Balance training;Patient/family education;Manual techniques;Dry needling;Taping;Passive range of motion    PT Next Visit Plan  WB stability improvements.    PT Home Exercise Plan  [id number]    Consulted and Agree with Plan of Care  Patient       Patient will benefit from skilled therapeutic intervention in order  to improve the following deficits and impairments:  Improper body mechanics, Increased muscle spasms, Decreased strength, Abnormal gait, Postural dysfunction, Pain, Decreased activity tolerance, Decreased endurance, Decreased range of motion  Visit Diagnosis: Pain in right ankle and joints of right foot  Stiffness of right ankle, not elsewhere classified  Muscle weakness (generalized)  Stiffness of left ankle, not elsewhere classified  Other abnormalities of gait and mobility  Chronic pain of right knee     Problem List Patient Active Problem List   Diagnosis Date Noted  . Cataract of left eye 04/28/2019  . Erectile dysfunction 04/28/2019  . Onychomycosis 05/05/2014  . History of prostate cancer 05/22/2011  . GERD 02/02/2009  . HYPERCHOLESTEROLEMIA 02/01/2009  . Essential hypertension 02/01/2009  . Diverticulosis of colon 02/01/2009   [name], PT, DPT, OCS, ATC 12/17/19  12:17 PM    [facility] [address] [place], [place], [address] Phone: [phone]   Fax:  [phone]  Name: Marcellous Veltman. MRN: [id number] Date of Birth: 1946/08/10

## 2019-12-22 ENCOUNTER — Ambulatory Visit: Payer: Medicare PPO | Admitting: Rehabilitative and Restorative Service Providers"

## 2019-12-22 ENCOUNTER — Other Ambulatory Visit: Payer: Self-pay

## 2019-12-22 ENCOUNTER — Other Ambulatory Visit: Payer: Self-pay | Admitting: Family Medicine

## 2019-12-22 ENCOUNTER — Encounter: Payer: Self-pay | Admitting: Rehabilitative and Restorative Service Providers"

## 2019-12-22 DIAGNOSIS — M25671 Stiffness of right ankle, not elsewhere classified: Secondary | ICD-10-CM

## 2019-12-22 DIAGNOSIS — M25571 Pain in right ankle and joints of right foot: Secondary | ICD-10-CM | POA: Diagnosis not present

## 2019-12-22 DIAGNOSIS — R2689 Other abnormalities of gait and mobility: Secondary | ICD-10-CM

## 2019-12-22 DIAGNOSIS — M25672 Stiffness of left ankle, not elsewhere classified: Secondary | ICD-10-CM

## 2019-12-22 DIAGNOSIS — M6281 Muscle weakness (generalized): Secondary | ICD-10-CM

## 2019-12-22 DIAGNOSIS — M25561 Pain in right knee: Secondary | ICD-10-CM

## 2019-12-22 DIAGNOSIS — G8929 Other chronic pain: Secondary | ICD-10-CM

## 2019-12-22 DIAGNOSIS — E7800 Pure hypercholesterolemia, unspecified: Secondary | ICD-10-CM

## 2019-12-22 NOTE — Addendum Note (Signed)
Addended by: [name] B on: 12/22/2019 12:46 PM   Modules accepted: Orders

## 2019-12-22 NOTE — Therapy (Signed)
[facility] [address] [place], [place], [address] Phone: [phone]   Fax:  [phone]  Physical Therapy Treatment  Patient Details  Name: Ernest Hughes. MRN: [id number] Date of Birth: May 12, 1946 Referring Provider (PT):  Newt Minion, MD    Encounter Date: 12/22/2019  PT End of Session - 12/22/19 1200    Visit Number  6    Number of Visits  13    Date for PT Re-Evaluation  01/16/20    Authorization Type  MCR: Kx modat 15th visit    Progress Note Due on Visit  10    PT Start Time  1146    PT Stop Time  1235    PT Time Calculation (min)  49 min    Activity Tolerance  Patient tolerated treatment well    Behavior During Therapy  [facility] for tasks assessed/performed       Past Medical History:  Diagnosis Date  . BPH (benign prostatic hyperplasia)   . Cancer ([place])    PROSTATE--treated with radiation  . Diverticulosis   . GERD (gastroesophageal reflux disease)   . Hypertension     Past Surgical History:  Procedure Laterality Date  . BELPHAROPTOSIS REPAIR  12/2013   bilateral  . cataract surgery Left 2019  . COLONOSCOPY     Dr. Sharlett Iles    There were no vitals filed for this visit.  Subjective Assessment - 12/22/19 1158    Subjective  Pt. indicated feeling mild improveent in last week or so.  Still noting some medial arch related symptoms at times in walking activity.  Pt. indicated he felt some improvement in initial standing/walking after lying by standing to stretch shortly before trying to walk.    Limitations  Walking;Standing    How long can you sit comfortably?  unlimited    How long can you stand comfortably?  10-15 min with boot on    How long can you walk comfortably?  10-15 min with boot on    Diagnostic tests  MRI - 12/03/2019 IMPRESSION:1. Mild tendinosis of the Achilles tendon without a tear.2. High-grade partial-thickness cartilage loss of the medial aspectof the talonavicular joint with subchondral marrow edema  in thetalus.    Patient Stated Goals  to decrease pain, return to playing golf    Currently in Pain?  Yes    Pain Score  2     Pain Location  Foot    Pain Orientation  Right    Pain Descriptors / Indicators  Aching    Pain Type  Chronic pain    Pain Onset  More than a month ago    Pain Frequency  Intermittent    Aggravating Factors   prolonged standing/walking, standing after stationary activity ( reported to improve some).    Pain Relieving Factors  rest and stretching,         [facility] PT Assessment - 12/22/19 0001      Assessment   Medical Diagnosis  Posterior tibial tendon dysfunction (PTTD) of right lower extremity [id number]    Referring Provider (PT)   Newt Minion, MD     Onset Date/Surgical Date  09/24/19    Hand Dominance  Left      Prior Function   Level of Independence  Independent    Vocation  Retired                   [facility] Adult PT Treatment/Exercise - 12/22/19 0001      Neuro  Re-ed    Neuro Re-ed Details   tandem stance on foam 30 sec x 5 bilateral, SLS on foam 30 sec x 5 bilateral      Vasopneumatic   Number Minutes Vasopneumatic   5 minutes    Vasopnuematic Location   Ankle    Vasopneumatic Pressure  Medium    Vasopneumatic Temperature   34 deg      Ankle Exercises: Standing   Other Standing Ankle Exercises  reverse toe slider 20x    hold on next visit     Ankle Exercises: Stretches   Plantar Fascia Stretch  30 seconds;5 reps      Ankle Exercises: Seated   Other Seated Ankle Exercises  eccentric PF 3 x 10 blue band               PT Short Term Goals - 12/05/19 1511      PT SHORT TERM GOAL #1   Title  pt to be I with inital HEP    Time  3    Period  Weeks    Status  New    Target Date  12/26/19      PT SHORT TERM GOAL #2   Title  -        PT Long Term Goals - 12/10/19 1425      PT LONG TERM GOAL #1   Title  increase R ankle PF by >/= 6 degrees to promote toe off pattern and maximize gait efficiency    Baseline  -     Time  6    Period  Weeks    Status  On-going      PT LONG TERM GOAL #2   Title  increase R ankle gross strength to >/= 4+/5 to promote ankle stability and arch support to reduce pain to </= 2/10 pain    Baseline  -    Time  6    Period  Weeks    Status  On-going      PT LONG TERM GOAL #3   Title  pt to be able to stand and walk >/=45 min with </= 2/10 pain for functional endurance for community ambulation    Baseline  -    Time  6    Period  Weeks    Status  On-going      PT LONG TERM GOAL #4   Title  pt to return to driving and putting at >/= 50% max effort with </= 2/10 pain for pt's personal goals of returning to playing golf.    Time  6    Period  Weeks    Status  On-going      PT LONG TERM GOAL #5   Title  pt to be I with all HEP given as of last visit to maintain and progress current level of function    Time  6    Period  Weeks    Status  On-going            Plan - 12/22/19 1240    Clinical Impression Statement  Recommended Pt. to [employer] for shoe evaluation for possible change to improve positioning in ambulation/standing.  Pt. may also benefit from ionto application to posterior tib tendon, will send cert off today to MD office.    Personal Factors and Comorbidities  Comorbidity 1;Age    Comorbidities  hx of Cx    Examination-Activity Limitations  Stand;Locomotion Level    Rehab Potential  Good  PT Frequency  2x / week    PT Duration  6 weeks    PT Treatment/Interventions  ADLs/Self Care Home Management;Cryotherapy;Electrical Stimulation;Iontophoresis 4mg /ml Dexamethasone;Moist Heat;Ultrasound;Stair training;Therapeutic activities;Therapeutic exercise;Neuromuscular re-education;Balance training;Patient/family education;Manual techniques;Dry needling;Taping;Passive range of motion    PT Next Visit Plan  Ionto once approved, eccentric loading to posterior tib, foot intrinsic activation.    PT Home Exercise Plan  [id number]    Consulted and Agree with Plan  of Care  Patient       Patient will benefit from skilled therapeutic intervention in order to improve the following deficits and impairments:  Improper body mechanics, Increased muscle spasms, Decreased strength, Abnormal gait, Postural dysfunction, Pain, Decreased activity tolerance, Decreased endurance, Decreased range of motion  Visit Diagnosis: Pain in right ankle and joints of right foot  Stiffness of right ankle, not elsewhere classified  Muscle weakness (generalized)  Stiffness of left ankle, not elsewhere classified  Other abnormalities of gait and mobility  Chronic pain of right knee     Problem List Patient Active Problem List   Diagnosis Date Noted  . Cataract of left eye 04/28/2019  . Erectile dysfunction 04/28/2019  . Onychomycosis 05/05/2014  . History of prostate cancer 05/22/2011  . GERD 02/02/2009  . HYPERCHOLESTEROLEMIA 02/01/2009  . Essential hypertension 02/01/2009  . Diverticulosis of colon 02/01/2009   [name], PT, DPT, OCS, ATC 12/22/19  12:43 PM    [facility] [address] [place], [place], [address] Phone: [phone]   Fax:  [phone]  Name: Ernest Hughes. MRN: [id number] Date of Birth: 1946/02/23

## 2019-12-24 ENCOUNTER — Other Ambulatory Visit: Payer: Self-pay

## 2019-12-24 ENCOUNTER — Encounter: Payer: Self-pay | Admitting: Rehabilitative and Restorative Service Providers"

## 2019-12-24 ENCOUNTER — Ambulatory Visit: Payer: Medicare PPO | Admitting: Rehabilitative and Restorative Service Providers"

## 2019-12-24 DIAGNOSIS — M25561 Pain in right knee: Secondary | ICD-10-CM

## 2019-12-24 DIAGNOSIS — G8929 Other chronic pain: Secondary | ICD-10-CM | POA: Diagnosis not present

## 2019-12-24 DIAGNOSIS — M25671 Stiffness of right ankle, not elsewhere classified: Secondary | ICD-10-CM

## 2019-12-24 DIAGNOSIS — M6281 Muscle weakness (generalized): Secondary | ICD-10-CM | POA: Diagnosis not present

## 2019-12-24 DIAGNOSIS — M25571 Pain in right ankle and joints of right foot: Secondary | ICD-10-CM

## 2019-12-24 DIAGNOSIS — M25672 Stiffness of left ankle, not elsewhere classified: Secondary | ICD-10-CM | POA: Diagnosis not present

## 2019-12-24 DIAGNOSIS — R2689 Other abnormalities of gait and mobility: Secondary | ICD-10-CM

## 2019-12-24 NOTE — Therapy (Signed)
[facility] [address] [place], [place], [address] Phone: [phone]   Fax:  [phone]  Physical Therapy Treatment  Patient Details  Name: Ernest Hughes. MRN: [id number] Date of Birth: 10-03-1945 Referring Provider (PT):  Newt Minion, MD    Encounter Date: 12/24/2019  PT End of Session - 12/24/19 1144    Visit Number  7    Number of Visits  13    Date for PT Re-Evaluation  01/16/20    Authorization Type  MCR: Kx modat 15th visit    Authorization Time Period  01/16/2020 [employer]    Authorization - Visit Number  7    Authorization - Number of Visits  12    Progress Note Due on Visit  10    PT Start Time  [id number]    PT Stop Time  1230    PT Time Calculation (min)  45 min    Activity Tolerance  Patient tolerated treatment well    Behavior During Therapy  [facility] for tasks assessed/performed       Past Medical History:  Diagnosis Date  . BPH (benign prostatic hyperplasia)   . Cancer ([place])    PROSTATE--treated with radiation  . Diverticulosis   . GERD (gastroesophageal reflux disease)   . Hypertension     Past Surgical History:  Procedure Laterality Date  . BELPHAROPTOSIS REPAIR  12/2013   bilateral  . cataract surgery Left 2019  . COLONOSCOPY     Dr. Sharlett Iles    There were no vitals filed for this visit.  Subjective Assessment - 12/24/19 1153    Subjective  Pt. indicated mild complaints similar to previous visit.  Pt. indicated getting fitted for new shoes at [employer] since last visit.  Pt. stated they seem to feel good in short term.    Limitations  Walking;Standing    How long can you sit comfortably?  unlimited    How long can you stand comfortably?  10-15 min with boot on    How long can you walk comfortably?  10-15 min with boot on    Diagnostic tests  MRI - 12/03/2019 IMPRESSION:1. Mild tendinosis of the Achilles tendon without a tear.2. High-grade partial-thickness cartilage loss of the medial aspectof the talonavicular  joint with subchondral marrow edema in thetalus.    Patient Stated Goals  to decrease pain, return to playing golf    Currently in Pain?  Yes    Pain Location  Foot    Pain Orientation  Right    Pain Descriptors / Indicators  Aching    Pain Type  Chronic pain    Pain Onset  More than a month ago    Pain Frequency  Intermittent    Aggravating Factors   walking/standing    Pain Relieving Factors  resting                        [facility] Adult PT Treatment/Exercise - 12/24/19 0001      Neuro Re-ed    Neuro Re-ed Details   wobble board fwd/back, side to side DL 30 x each way, SLS c 3 cones anterior touch x 10 bilateral      Modalities   Modalities  Iontophoresis      Iontophoresis   Type of Iontophoresis  Dexamethasone    Location  medial aspect of posterior tib near navicular Rt foot    Dose  80 mA dose    Time  self  pack for home delivery, started at end of treatment session      Vasopneumatic   Number Minutes Vasopneumatic   5 minutes    Vasopnuematic Location   Ankle    Vasopneumatic Pressure  Medium    Vasopneumatic Temperature   34 deg      Ankle Exercises: Standing   Other Standing Ankle Exercises  standing PF c eccentric focus 3 x 10 DL      Ankle Exercises: Stretches   Gastroc Stretch  5 reps;30 seconds   runner stretch on incline board     Ankle Exercises: Aerobic   Nustep  lvl 6 10 mins               PT Short Term Goals - 12/05/19 1511      PT SHORT TERM GOAL #1   Title  pt to be I with inital HEP    Time  3    Period  Weeks    Status  New    Target Date  12/26/19      PT SHORT TERM GOAL #2   Title  -        PT Long Term Goals - 12/10/19 1425      PT LONG TERM GOAL #1   Title  increase R ankle PF by >/= 6 degrees to promote toe off pattern and maximize gait efficiency    Baseline  -    Time  6    Period  Weeks    Status  On-going      PT LONG TERM GOAL #2   Title  increase R ankle gross strength to >/= 4+/5 to promote  ankle stability and arch support to reduce pain to </= 2/10 pain    Baseline  -    Time  6    Period  Weeks    Status  On-going      PT LONG TERM GOAL #3   Title  pt to be able to stand and walk >/=45 min with </= 2/10 pain for functional endurance for community ambulation    Baseline  -    Time  6    Period  Weeks    Status  On-going      PT LONG TERM GOAL #4   Title  pt to return to driving and putting at >/= 50% max effort with </= 2/10 pain for pt's personal goals of returning to playing golf.    Time  6    Period  Weeks    Status  On-going      PT LONG TERM GOAL #5   Title  pt to be I with all HEP given as of last visit to maintain and progress current level of function    Time  6    Period  Weeks    Status  On-going            Plan - 12/24/19 1213    Clinical Impression Statement  Increased stability noted from shoe change today.  Pt. progression in WB eccentric PF tolerance but unable to perform SL without symptom exacerbation.  Fair movement coordination control noted overall in dynamic stability intervention.    Personal Factors and Comorbidities  Comorbidity 1;Age    Comorbidities  hx of Cx    Examination-Activity Limitations  Stand;Locomotion Level    Rehab Potential  Good    PT Frequency  2x / week    PT Duration  6 weeks    PT  Treatment/Interventions  ADLs/Self Care Home Management;Cryotherapy;Electrical Stimulation;Iontophoresis 4mg /ml Dexamethasone;Moist Heat;Ultrasound;Stair training;Therapeutic activities;Therapeutic exercise;Neuromuscular re-education;Balance training;Patient/family education;Manual techniques;Dry needling;Taping;Passive range of motion    PT Next Visit Plan  Continue ionto use, improve stability in SL stance, progress eccentric loading as tolerated.    PT Home Exercise Plan  [id number]    Consulted and Agree with Plan of Care  Patient       Patient will benefit from skilled therapeutic intervention in order to improve the following  deficits and impairments:  Improper body mechanics, Increased muscle spasms, Decreased strength, Abnormal gait, Postural dysfunction, Pain, Decreased activity tolerance, Decreased endurance, Decreased range of motion  Visit Diagnosis: Pain in right ankle and joints of right foot  Stiffness of right ankle, not elsewhere classified  Muscle weakness (generalized)  Stiffness of left ankle, not elsewhere classified  Other abnormalities of gait and mobility  Chronic pain of right knee     Problem List Patient Active Problem List   Diagnosis Date Noted  . Cataract of left eye 04/28/2019  . Erectile dysfunction 04/28/2019  . Onychomycosis 05/05/2014  . History of prostate cancer 05/22/2011  . GERD 02/02/2009  . HYPERCHOLESTEROLEMIA 02/01/2009  . Essential hypertension 02/01/2009  . Diverticulosis of colon 02/01/2009   [name], PT, DPT, OCS, ATC 12/24/19  12:31 PM     [facility] [address] [place], [place], [address] Phone: [phone]   Fax:  [phone]  Name: Zamere Pfeuffer. MRN: [id number] Date of Birth: 1946/07/09

## 2019-12-25 ENCOUNTER — Other Ambulatory Visit: Payer: Self-pay

## 2019-12-25 DIAGNOSIS — E7800 Pure hypercholesterolemia, unspecified: Secondary | ICD-10-CM

## 2019-12-25 MED ORDER — ATORVASTATIN CALCIUM 10 MG PO TABS: 10.0000 mg | ORAL_TABLET | Freq: Every day | ORAL | 0 refills | Status: DC

## 2019-12-29 ENCOUNTER — Ambulatory Visit: Payer: Medicare PPO | Admitting: Rehabilitative and Restorative Service Providers"

## 2019-12-29 ENCOUNTER — Encounter: Payer: Self-pay | Admitting: Rehabilitative and Restorative Service Providers"

## 2019-12-29 ENCOUNTER — Other Ambulatory Visit: Payer: Self-pay

## 2019-12-29 DIAGNOSIS — M25561 Pain in right knee: Secondary | ICD-10-CM

## 2019-12-29 DIAGNOSIS — M25571 Pain in right ankle and joints of right foot: Secondary | ICD-10-CM | POA: Diagnosis not present

## 2019-12-29 DIAGNOSIS — M6281 Muscle weakness (generalized): Secondary | ICD-10-CM | POA: Diagnosis not present

## 2019-12-29 DIAGNOSIS — M25671 Stiffness of right ankle, not elsewhere classified: Secondary | ICD-10-CM | POA: Diagnosis not present

## 2019-12-29 DIAGNOSIS — G8929 Other chronic pain: Secondary | ICD-10-CM

## 2019-12-29 DIAGNOSIS — R2689 Other abnormalities of gait and mobility: Secondary | ICD-10-CM

## 2019-12-29 DIAGNOSIS — M25672 Stiffness of left ankle, not elsewhere classified: Secondary | ICD-10-CM

## 2019-12-29 NOTE — Therapy (Signed)
[facility] [address] [place], [place], [address] Phone: [phone]   Fax:  [phone]  Physical Therapy Treatment  Patient Details  Name: Ernest Hughes. MRN: [id number] Date of Birth: 11/13/1945 Referring Provider (PT):  Newt Minion, MD    Encounter Date: 12/29/2019  PT End of Session - 12/29/19 1158    Visit Number  8    Number of Visits  13    Date for PT Re-Evaluation  01/16/20    Authorization Type  MCR: Kx modat 15th visit    Authorization Time Period  01/16/2020 [employer]    Authorization - Visit Number  8    Authorization - Number of Visits  12    Progress Note Due on Visit  10    PT Start Time  1146    PT Stop Time  1226    PT Time Calculation (min)  40 min    Activity Tolerance  Patient tolerated treatment well    Behavior During Therapy  [facility] for tasks assessed/performed       Past Medical History:  Diagnosis Date  . BPH (benign prostatic hyperplasia)   . Cancer ([place])    PROSTATE--treated with radiation  . Diverticulosis   . GERD (gastroesophageal reflux disease)   . Hypertension     Past Surgical History:  Procedure Laterality Date  . BELPHAROPTOSIS REPAIR  12/2013   bilateral  . cataract surgery Left 2019  . COLONOSCOPY     Dr. Sharlett Iles    There were no vitals filed for this visit.  Subjective Assessment - 12/29/19 1156    Subjective  Pt. indicated getting a good weekend with symptoms mild or so.  Pt. stated he did restrict walking but overall felt like weekend was better.  Pt. indicated some improvement in PF control in standing.    Limitations  Walking;Standing    How long can you sit comfortably?  unlimited    How long can you stand comfortably?  10-15 min with boot on    How long can you walk comfortably?  10-15 min with boot on    Diagnostic tests  MRI - 12/03/2019 IMPRESSION:1. Mild tendinosis of the Achilles tendon without a tear.2. High-grade partial-thickness cartilage loss of the medial aspectof  the talonavicular joint with subchondral marrow edema in thetalus.    Patient Stated Goals  to decrease pain, return to playing golf    Currently in Pain?  No/denies    Pain Score  0-No pain    Pain Location  Foot    Pain Orientation  Right    Pain Descriptors / Indicators  Aching    Pain Type  Chronic pain    Pain Onset  More than a month ago    Pain Frequency  Intermittent    Aggravating Factors   consistent report from walking prolonged.                        [facility] Adult PT Treatment/Exercise - 12/29/19 0001      Neuro Re-ed    Neuro Re-ed Details   wobble board fwd/back 30 x, tandem ambulation fwd/rev 15 ft x 5 each, star excursion 5 cones x 5 bilateral c slider, 3 cones anterior SLS x 10 bilateral      Modalities   Modalities  Iontophoresis      Iontophoresis   Type of Iontophoresis  Dexamethasone    Location  medial aspect of posterior tib near navicular Rt  foot    Dose  80 mA dose    Time  self pack for home, application in clinc      Ankle Exercises: Aerobic   Nustep  lvl 6 10 mins      Ankle Exercises: Stretches   Gastroc Stretch  5 reps;30 seconds               PT Short Term Goals - 12/05/19 1511      PT SHORT TERM GOAL #1   Title  pt to be I with inital HEP    Time  3    Period  Weeks    Status  New    Target Date  12/26/19      PT SHORT TERM GOAL #2   Title  -        PT Long Term Goals - 12/10/19 1425      PT LONG TERM GOAL #1   Title  increase R ankle PF by >/= 6 degrees to promote toe off pattern and maximize gait efficiency    Baseline  -    Time  6    Period  Weeks    Status  On-going      PT LONG TERM GOAL #2   Title  increase R ankle gross strength to >/= 4+/5 to promote ankle stability and arch support to reduce pain to </= 2/10 pain    Baseline  -    Time  6    Period  Weeks    Status  On-going      PT LONG TERM GOAL #3   Title  pt to be able to stand and walk >/=45 min with </= 2/10 pain for functional  endurance for community ambulation    Baseline  -    Time  6    Period  Weeks    Status  On-going      PT LONG TERM GOAL #4   Title  pt to return to driving and putting at >/= 50% max effort with </= 2/10 pain for pt's personal goals of returning to playing golf.    Time  6    Period  Weeks    Status  On-going      PT LONG TERM GOAL #5   Title  pt to be I with all HEP given as of last visit to maintain and progress current level of function    Time  6    Period  Weeks    Status  On-going            Plan - 12/29/19 1231    Clinical Impression Statement  Fair to good performance progression on some movement coordination but SLS c weight displacement continued to prove difficult.  Overall symptoms reduction reported in last week or so.  Continued skilled PT services indicated at this time.    Personal Factors and Comorbidities  Comorbidity 1;Age    Examination-Activity Limitations  Stand;Locomotion Level    Rehab Potential  Good    PT Frequency  2x / week    PT Duration  6 weeks    PT Treatment/Interventions  ADLs/Self Care Home Management;Cryotherapy;Electrical Stimulation;Iontophoresis 4mg /ml Dexamethasone;Moist Heat;Ultrasound;Stair training;Therapeutic activities;Therapeutic exercise;Neuromuscular re-education;Balance training;Patient/family education;Manual techniques;Dry needling;Taping;Passive range of motion    PT Next Visit Plan  Ionto, movement coordination improvements in dynamic stability.    PT Home Exercise Plan  [id number]    Consulted and Agree with Plan of Care  Patient  Patient will benefit from skilled therapeutic intervention in order to improve the following deficits and impairments:  Improper body mechanics, Increased muscle spasms, Decreased strength, Abnormal gait, Postural dysfunction, Pain, Decreased activity tolerance, Decreased endurance, Decreased range of motion  Visit Diagnosis: Pain in right ankle and joints of right foot  Stiffness of right  ankle, not elsewhere classified  Muscle weakness (generalized)  Stiffness of left ankle, not elsewhere classified  Other abnormalities of gait and mobility  Chronic pain of right knee     Problem List Patient Active Problem List   Diagnosis Date Noted  . Cataract of left eye 04/28/2019  . Erectile dysfunction 04/28/2019  . Onychomycosis 05/05/2014  . History of prostate cancer 05/22/2011  . GERD 02/02/2009  . HYPERCHOLESTEROLEMIA 02/01/2009  . Essential hypertension 02/01/2009  . Diverticulosis of colon 02/01/2009    [name], PT, DPT, OCS, ATC 12/29/19  12:33 PM    [facility] [address] [place], [place], [address] Phone: [phone]   Fax:  [phone]  Name: Ernest Hughes. MRN: [id number] Date of Birth: 16-Nov-1945

## 2019-12-31 ENCOUNTER — Encounter: Payer: Self-pay | Admitting: Rehabilitative and Restorative Service Providers"

## 2019-12-31 ENCOUNTER — Other Ambulatory Visit: Payer: Self-pay

## 2019-12-31 ENCOUNTER — Ambulatory Visit: Payer: Medicare PPO | Admitting: Rehabilitative and Restorative Service Providers"

## 2019-12-31 DIAGNOSIS — G8929 Other chronic pain: Secondary | ICD-10-CM | POA: Diagnosis not present

## 2019-12-31 DIAGNOSIS — M25561 Pain in right knee: Secondary | ICD-10-CM | POA: Diagnosis not present

## 2019-12-31 DIAGNOSIS — R2689 Other abnormalities of gait and mobility: Secondary | ICD-10-CM

## 2019-12-31 DIAGNOSIS — M25672 Stiffness of left ankle, not elsewhere classified: Secondary | ICD-10-CM | POA: Diagnosis not present

## 2019-12-31 DIAGNOSIS — M6281 Muscle weakness (generalized): Secondary | ICD-10-CM | POA: Diagnosis not present

## 2019-12-31 DIAGNOSIS — M25671 Stiffness of right ankle, not elsewhere classified: Secondary | ICD-10-CM

## 2019-12-31 DIAGNOSIS — M25571 Pain in right ankle and joints of right foot: Secondary | ICD-10-CM

## 2019-12-31 NOTE — Therapy (Addendum)
[facility] [address] [place], [place], [address] Phone: [phone]   Fax:  [phone]  Physical Therapy Treatment/Discharge  Patient Details  Name: Ernest Hughes. MRN: [id number] Date of Birth: [birth year] Referring Provider (PT):  Newt Minion, MD    Encounter Date: 12/31/2019  PT End of Session - 12/31/19 1426    Visit Number  9    Number of Visits  13    Date for PT Re-Evaluation  01/16/20    Authorization Type  MCR: Kx modat 15th visit    Authorization Time Period  01/16/2020 [employer]    Authorization - Visit Number  9    Authorization - Number of Visits  12    Progress Note Due on Visit  10    PT Start Time  4132    PT Stop Time  1510    PT Time Calculation (min)  42 min    Activity Tolerance  Patient tolerated treatment well    Behavior During Therapy  WFL for tasks assessed/performed       Past Medical History:  Diagnosis Date  . BPH (benign prostatic hyperplasia)   . Cancer ([place])    PROSTATE--treated with radiation  . Diverticulosis   . GERD (gastroesophageal reflux disease)   . Hypertension     Past Surgical History:  Procedure Laterality Date  . BELPHAROPTOSIS REPAIR  12/2013   bilateral  . cataract surgery Left 2019  . COLONOSCOPY     Dr. Sharlett Iles    There were no vitals filed for this visit.  Subjective Assessment - 12/31/19 1429    Subjective  Pt. indicated pain at worst reported at 3-4/10 since last visit.  Overall felt pretty good with some insidious soreness reported this morning.    Limitations  Walking;Standing    How long can you sit comfortably?  unlimited    How long can you stand comfortably?  10-15 min with boot on    How long can you walk comfortably?  10-15 min with boot on    Diagnostic tests  MRI - 12/03/2019 IMPRESSION:1. Mild tendinosis of the Achilles tendon without a tear.2. High-grade partial-thickness cartilage loss of the medial aspectof the talonavicular joint with subchondral marrow  edema in thetalus.    Patient Stated Goals  to decrease pain, return to playing golf    Currently in Pain?  Yes    Pain Score  1     Pain Location  Foot    Pain Orientation  Right    Pain Descriptors / Indicators  Aching    Pain Type  Chronic pain    Pain Onset  More than a month ago    Pain Frequency  Occasional    Aggravating Factors   insidious soreness this morning                        [facility] Adult PT Treatment/Exercise - 12/31/19 0001      Neuro Re-ed    Neuro Re-ed Details   tandem ambulation fwd/rev 15 ft x 6 each way, star excursion slider 5 cones x 10 bilateral, wobble board fwd/back, side to side 45 x each way, 3 cones anterior SLS touch x 10 bilateral      Iontophoresis   Type of Iontophoresis  Dexamethasone    Location  medial aspect of posterior tib near navicular Rt foot    Dose  80 mA dose    Time  self pack for home,  application in clinc      Ankle Exercises: Aerobic   Recumbent Bike  lvl 3 10 mins      Ankle Exercises: Standing   Other Standing Ankle Exercises  standing PF c walking 15 ft x 6 fwd      Ankle Exercises: Stretches   Slant Board Stretch  5 reps;30 seconds   bilateral              PT Short Term Goals - 12/05/19 1511      PT SHORT TERM GOAL #1   Title  pt to be I with inital HEP    Time  3    Period  Weeks    Status  New    Target Date  12/26/19      PT SHORT TERM GOAL #2   Title  -        PT Long Term Goals - 12/10/19 1425      PT LONG TERM GOAL #1   Title  increase R ankle PF by >/= 6 degrees to promote toe off pattern and maximize gait efficiency    Baseline  -    Time  6    Period  Weeks    Status  On-going      PT LONG TERM GOAL #2   Title  increase R ankle gross strength to >/= 4+/5 to promote ankle stability and arch support to reduce pain to </= 2/10 pain    Baseline  -    Time  6    Period  Weeks    Status  On-going      PT LONG TERM GOAL #3   Title  pt to be able to stand and walk >/=45  min with </= 2/10 pain for functional endurance for community ambulation    Baseline  -    Time  6    Period  Weeks    Status  On-going      PT LONG TERM GOAL #4   Title  pt to return to driving and putting at >/= 50% max effort with </= 2/10 pain for pt's personal goals of returning to playing golf.    Time  6    Period  Weeks    Status  On-going      PT LONG TERM GOAL #5   Title  pt to be I with all HEP given as of last visit to maintain and progress current level of function    Time  6    Period  Weeks    Status  On-going            Plan - 12/31/19 1501    Clinical Impression Statement  Improved tandem ambulation and stance noted, both fwd/reverse.  SLS fair again today.  Improved WB PF movement noted c reduced complaints c movement.    Personal Factors and Comorbidities  Comorbidity 1;Age    Examination-Activity Limitations  Stand;Locomotion Level    Rehab Potential  Good    PT Frequency  2x / week    PT Duration  6 weeks    PT Treatment/Interventions  ADLs/Self Care Home Management;Cryotherapy;Electrical Stimulation;Iontophoresis '4mg'$ /ml Dexamethasone;Moist Heat;Ultrasound;Stair training;Therapeutic activities;Therapeutic exercise;Neuromuscular re-education;Balance training;Patient/family education;Manual techniques;Dry needling;Taping;Passive range of motion    PT Next Visit Plan  SLS balance complaint/non complaint surfaces    PT Home Exercise Plan  [id number]    Consulted and Agree with Plan of Care  Patient       Patient will benefit from skilled  therapeutic intervention in order to improve the following deficits and impairments:  Improper body mechanics, Increased muscle spasms, Decreased strength, Abnormal gait, Postural dysfunction, Pain, Decreased activity tolerance, Decreased endurance, Decreased range of motion  Visit Diagnosis: Pain in right ankle and joints of right foot  Stiffness of right ankle, not elsewhere classified  Muscle weakness  (generalized)  Stiffness of left ankle, not elsewhere classified  Other abnormalities of gait and mobility  Chronic pain of right knee     Problem List Patient Active Problem List   Diagnosis Date Noted  . Cataract of left eye 04/28/2019  . Erectile dysfunction 04/28/2019  . Onychomycosis 05/05/2014  . History of prostate cancer 05/22/2011  . GERD 02/02/2009  . HYPERCHOLESTEROLEMIA 02/01/2009  . Essential hypertension 02/01/2009  . Diverticulosis of colon 02/01/2009   [name], PT, DPT, OCS, ATC 12/31/19  3:09 PM  PHYSICAL THERAPY DISCHARGE SUMMARY  Visits from Start of Care: 9  Current functional level related to goals / functional outcomes: See note   Remaining deficits: See note   Education / Equipment: HEP Plan: Patient agrees to discharge.  Patient goals were partially met. Patient is being discharged due to not returning since the last visit.  ?????    [name], PT, DPT, OCS, ATC 03/16/20  12:14 PM      [facility] [address] [place], [place], [address] Phone: [phone]   Fax:  [phone]  Name: Ernest Hughes. MRN: [id number] Date of Birth: 04-07-1946

## 2020-01-01 ENCOUNTER — Encounter: Payer: Self-pay | Admitting: Orthopedic Surgery

## 2020-01-01 ENCOUNTER — Ambulatory Visit: Payer: Medicare PPO | Admitting: Orthopedic Surgery

## 2020-01-01 VITALS — Ht 69.0 in | Wt 186.0 lb

## 2020-01-01 DIAGNOSIS — M76821 Posterior tibial tendinitis, right leg: Secondary | ICD-10-CM

## 2020-01-02 ENCOUNTER — Encounter: Payer: Self-pay | Admitting: Orthopedic Surgery

## 2020-01-02 NOTE — Progress Notes (Signed)
Office Visit Note   Patient: Ernest Hughes.           Date of Birth: 04-09-1946           MRN: [id number] Visit Date: 01/01/2020              Requested by: Denita Lung, MD [address] [place],  [place] [address] PCP: Denita Lung, MD  Chief Complaint  Patient presents with  . Right Ankle - Follow-up    PTTD      HPI: Patient is a 74 year old gentleman and presents in follow-up for his posterior tibial tendinitis. He has been doing physical therapy wearing orthotics stiff soled shoes he states he feels much better but not 100%.  Assessment & Plan: Visit Diagnoses:  1. Posterior tibial tendon dysfunction (PTTD) of right lower extremity     Plan: Recommended sole orthotics continue with the new balance sneakers continue with physical therapy on his own he may use Voltaren gel.  Follow-Up Instructions: Return if symptoms worsen or fail to improve.   Ortho Exam  Patient is alert, oriented, no adenopathy, well-dressed, normal affect, normal respiratory effort. Examination patient has a good pulse he has good ankle good subtalar motion no pronation or valgus the posterior tibial tendon has less swelling and is not tender to palpation there is some tenderness at the insertion.  Imaging: No results found. No images are attached to the encounter.  Labs: No results found for: HGBA1C, ESRSEDRATE, CRP, LABURIC, REPTSTATUS, GRAMSTAIN, CULT, LABORGA   Lab Results  Component Value Date   ALBUMIN 4.0 04/28/2019   ALBUMIN 3.8 12/12/2017   ALBUMIN 3.7 09/20/2016    No results found for: MG No results found for: VD25OH  No results found for: PREALBUMIN CBC EXTENDED Latest Ref Rng & Units 04/28/2019 12/12/2017 09/20/2016  WBC 3.4 - 10.8 x10E3/uL 4.9 4.6 3.8(L)  RBC 4.14 - 5.80 x10E6/uL 4.97 4.92 4.85  HGB 13.0 - 17.7 g/dL 14.6 13.7 13.7  HCT 37.5 - 51.0 % 43.0 43.0 42.2  PLT 150 - 450 x10E3/uL 183 192 183  NEUTROABS 1.4 - 7.0 x10E3/uL 2.6 2.0 1,710  LYMPHSABS  0.7 - 3.1 x10E3/uL 2.0 2.2 1,596     Body mass index is 27.47 kg/m.  Orders:  No orders of the defined types were placed in this encounter.  No orders of the defined types were placed in this encounter.    Procedures: No procedures performed  Clinical Data: No additional findings.  ROS:  All other systems negative, except as noted in the HPI. Review of Systems  Objective: Vital Signs: Ht 5\' 9"  (1.753 m)   Wt 186 lb (84.4 kg)   BMI 27.47 kg/m   Specialty Comments:  No specialty comments available.  PMFS History: Patient Active Problem List   Diagnosis Date Noted  . Cataract of left eye 04/28/2019  . Erectile dysfunction 04/28/2019  . Onychomycosis 05/05/2014  . History of prostate cancer 05/22/2011  . GERD 02/02/2009  . HYPERCHOLESTEROLEMIA 02/01/2009  . Essential hypertension 02/01/2009  . Diverticulosis of colon 02/01/2009   Past Medical History:  Diagnosis Date  . BPH (benign prostatic hyperplasia)   . Cancer ([place])    PROSTATE--treated with radiation  . Diverticulosis   . GERD (gastroesophageal reflux disease)   . Hypertension     Family History  Problem Relation Age of Onset  . Cancer Mother   . Cancer Father   . Heart disease Sister   . Cancer Maternal Aunt   .  Cancer Paternal Aunt   . Cancer Paternal Grandfather     Past Surgical History:  Procedure Laterality Date  . BELPHAROPTOSIS REPAIR  12/2013   bilateral  . cataract surgery Left 2019  . COLONOSCOPY     Dr. Sharlett Iles   Social History   Occupational History  . Not on file  Tobacco Use  . Smoking status: Never Smoker  . Smokeless tobacco: Never Used  Substance and Sexual Activity  . Alcohol use: Yes    Comment: has some wine once in a while   . Drug use: No  . Sexual activity: Not Currently

## 2020-01-07 DIAGNOSIS — H25811 Combined forms of age-related cataract, right eye: Secondary | ICD-10-CM | POA: Diagnosis not present

## 2020-01-07 DIAGNOSIS — H2511 Age-related nuclear cataract, right eye: Secondary | ICD-10-CM | POA: Diagnosis not present

## 2020-01-07 DIAGNOSIS — Z961 Presence of intraocular lens: Secondary | ICD-10-CM | POA: Diagnosis not present

## 2020-01-07 DIAGNOSIS — H04123 Dry eye syndrome of bilateral lacrimal glands: Secondary | ICD-10-CM | POA: Diagnosis not present

## 2020-01-07 DIAGNOSIS — H47323 Drusen of optic disc, bilateral: Secondary | ICD-10-CM | POA: Diagnosis not present

## 2020-01-07 DIAGNOSIS — H10413 Chronic giant papillary conjunctivitis, bilateral: Secondary | ICD-10-CM | POA: Diagnosis not present

## 2020-02-22 DIAGNOSIS — H2511 Age-related nuclear cataract, right eye: Secondary | ICD-10-CM | POA: Diagnosis not present

## 2020-02-26 DIAGNOSIS — H268 Other specified cataract: Secondary | ICD-10-CM | POA: Diagnosis not present

## 2020-02-26 DIAGNOSIS — H25011 Cortical age-related cataract, right eye: Secondary | ICD-10-CM | POA: Diagnosis not present

## 2020-04-20 DIAGNOSIS — Z961 Presence of intraocular lens: Secondary | ICD-10-CM | POA: Diagnosis not present

## 2020-05-03 ENCOUNTER — Encounter: Payer: Self-pay | Admitting: Orthopedic Surgery

## 2020-05-03 ENCOUNTER — Ambulatory Visit: Payer: Medicare PPO | Admitting: Orthopedic Surgery

## 2020-05-03 ENCOUNTER — Ambulatory Visit: Payer: Self-pay

## 2020-05-03 ENCOUNTER — Other Ambulatory Visit: Payer: Self-pay

## 2020-05-03 VITALS — Ht 69.0 in | Wt 186.0 lb

## 2020-05-03 DIAGNOSIS — M25562 Pain in left knee: Secondary | ICD-10-CM | POA: Diagnosis not present

## 2020-05-08 ENCOUNTER — Encounter: Payer: Self-pay | Admitting: Orthopedic Surgery

## 2020-05-08 DIAGNOSIS — M25562 Pain in left knee: Secondary | ICD-10-CM

## 2020-05-08 MED ORDER — METHYLPREDNISOLONE ACETATE 40 MG/ML IJ SUSP
40.0000 mg | INTRAMUSCULAR | Status: AC | PRN
  Administered 2020-05-08: 40 mg via INTRA_ARTICULAR

## 2020-05-08 MED ORDER — LIDOCAINE HCL (PF) 1 % IJ SOLN
5.0000 mL | INTRAMUSCULAR | Status: AC | PRN
  Administered 2020-05-08: 5 mL

## 2020-05-08 NOTE — Progress Notes (Signed)
Office Visit Note   Patient: Ernest Hughes.           Date of Birth: [birth year]           MRN: [id number] Visit Date: 05/03/2020              Requested by: Denita Lung, MD [address] [place],  [place] [address] PCP: Denita Lung, MD  Chief Complaint  Patient presents with  . Left Knee - Pain      HPI: Patient is a 74 year old gentleman who presents with several week history of left knee pain he states he has swelling pain over the medial joint line wakes him up at night secondary to pain complains of popping and start up stiffness he does use 1 crutch.  Assessment & Plan: Visit Diagnoses:  1. Acute pain of left knee     Plan: The knee was injected he tolerated this well reevaluate in 3 weeks patient may need an MRI scan to evaluate meniscal pathology.  Follow-Up Instructions: Return in about 3 weeks (around 05/24/2020).   Ortho Exam  Patient is alert, oriented, no adenopathy, well-dressed, normal affect, normal respiratory effort. Examination patient has a mild effusion of the left knee he is tender to palpation over the medial joint line he states he is tried Voltaren gel without relief.  Collaterals and cruciates are stable.  Right knee has a history of swelling but he has no pain at this time in the right knee.  There is crepitation with range of motion.  Imaging: No results found. No images are attached to the encounter.  Labs: No results found for: HGBA1C, ESRSEDRATE, CRP, LABURIC, REPTSTATUS, GRAMSTAIN, CULT, LABORGA   Lab Results  Component Value Date   ALBUMIN 4.0 04/28/2019   ALBUMIN 3.8 12/12/2017   ALBUMIN 3.7 09/20/2016    No results found for: MG No results found for: VD25OH  No results found for: PREALBUMIN CBC EXTENDED Latest Ref Rng & Units 04/28/2019 12/12/2017 09/20/2016  WBC 3.4 - 10.8 x10E3/uL 4.9 4.6 3.8(L)  RBC 4.14 - 5.80 x10E6/uL 4.97 4.92 4.85  HGB 13.0 - 17.7 g/dL 14.6 13.7 13.7  HCT 37.5 - 51.0 % 43.0 43.0 42.2  PLT  150 - 450 x10E3/uL 183 192 183  NEUTROABS 1 - 7 x10E3/uL 2.6 2.0 1,710  LYMPHSABS 0 - 3 x10E3/uL 2.0 2.2 1,596     Body mass index is 27.47 kg/m.  Orders:  Orders Placed This Encounter  Procedures  . XR Knee 1-2 Views Left   No orders of the defined types were placed in this encounter.    Procedures: Large Joint Inj: L knee on 05/08/2020 1:02 PM Indications: pain and diagnostic evaluation Details: 22 G 1.5 in needle, anteromedial approach  Arthrogram: No  Medications: 5 mL lidocaine (PF) 1 %; 40 mg methylPREDNISolone acetate 40 MG/ML Outcome: tolerated well, no immediate complications Procedure, treatment alternatives, risks and benefits explained, specific risks discussed. Consent was given by the patient. Immediately prior to procedure a time out was called to verify the correct patient, procedure, equipment, support staff and site/side marked as required. Patient was prepped and draped in the usual sterile fashion.      Clinical Data: No additional findings.  ROS:  All other systems negative, except as noted in the HPI. Review of Systems  Objective: Vital Signs: Ht 5\' 9"  (1.753 m)   Wt 186 lb (84.4 kg)   BMI 27.47 kg/m   Specialty Comments:  No specialty comments  available.  PMFS History: Patient Active Problem List   Diagnosis Date Noted  . Cataract of left eye 04/28/2019  . Erectile dysfunction 04/28/2019  . Onychomycosis 05/05/2014  . History of prostate cancer 05/22/2011  . GERD 02/02/2009  . HYPERCHOLESTEROLEMIA 02/01/2009  . Essential hypertension 02/01/2009  . Diverticulosis of colon 02/01/2009   Past Medical History:  Diagnosis Date  . BPH (benign prostatic hyperplasia)   . Cancer ([place])    PROSTATE--treated with radiation  . Diverticulosis   . GERD (gastroesophageal reflux disease)   . Hypertension     Family History  Problem Relation Age of Onset  . Cancer Mother   . Cancer Father   . Heart disease Sister   . Cancer Maternal Aunt    . Cancer Paternal Aunt   . Cancer Paternal Grandfather     Past Surgical History:  Procedure Laterality Date  . BELPHAROPTOSIS REPAIR  12/2013   bilateral  . cataract surgery Left 2019  . COLONOSCOPY     Dr. Sharlett Iles   Social History   Occupational History  . Not on file  Tobacco Use  . Smoking status: Never Smoker  . Smokeless tobacco: Never Used  Substance and Sexual Activity  . Alcohol use: Yes    Comment: has some wine once in a while   . Drug use: No  . Sexual activity: Not Currently

## 2020-05-10 ENCOUNTER — Other Ambulatory Visit: Payer: Self-pay

## 2020-05-10 ENCOUNTER — Encounter: Payer: Self-pay | Admitting: Family Medicine

## 2020-05-10 ENCOUNTER — Ambulatory Visit: Payer: Medicare PPO | Admitting: Family Medicine

## 2020-05-10 VITALS — BP 130/76 | HR 98 | Temp 99.8°F | Ht 67.5 in | Wt 186.0 lb

## 2020-05-10 DIAGNOSIS — K219 Gastro-esophageal reflux disease without esophagitis: Secondary | ICD-10-CM | POA: Diagnosis not present

## 2020-05-10 DIAGNOSIS — N529 Male erectile dysfunction, unspecified: Secondary | ICD-10-CM

## 2020-05-10 DIAGNOSIS — Z0000 Encounter for general adult medical examination without abnormal findings: Secondary | ICD-10-CM

## 2020-05-10 DIAGNOSIS — E7800 Pure hypercholesterolemia, unspecified: Secondary | ICD-10-CM | POA: Diagnosis not present

## 2020-05-10 DIAGNOSIS — M1990 Unspecified osteoarthritis, unspecified site: Secondary | ICD-10-CM | POA: Diagnosis not present

## 2020-05-10 DIAGNOSIS — Z8546 Personal history of malignant neoplasm of prostate: Secondary | ICD-10-CM | POA: Diagnosis not present

## 2020-05-10 DIAGNOSIS — Z9849 Cataract extraction status, unspecified eye: Secondary | ICD-10-CM | POA: Diagnosis not present

## 2020-05-10 DIAGNOSIS — I10 Essential (primary) hypertension: Secondary | ICD-10-CM

## 2020-05-10 DIAGNOSIS — M25562 Pain in left knee: Secondary | ICD-10-CM

## 2020-05-10 DIAGNOSIS — R739 Hyperglycemia, unspecified: Secondary | ICD-10-CM | POA: Diagnosis not present

## 2020-05-10 MED ORDER — AMLODIPINE-OLMESARTAN 5-20 MG PO TABS: 1.0000 | ORAL_TABLET | Freq: Every day | ORAL | 3 refills | Status: DC

## 2020-05-10 MED ORDER — ATORVASTATIN CALCIUM 10 MG PO TABS: 10.0000 mg | ORAL_TABLET | Freq: Every day | ORAL | 0 refills | Status: DC

## 2020-05-10 NOTE — Patient Instructions (Addendum)
  Ernest Hughes , Thank you for taking time to come for your Medicare Wellness Visit. I appreciate your ongoing commitment to your health goals. Please review the following plan we discussed and let me know if I can assist you in the future.   These are the goals we discussed: Continue on your present medications.  Use Tylenol and do not hesitate to also use Aleve for your pain.  Follow-up with Dr. Sharol Given concerning MRI and further work on your knee.  Continue on all your other medications.  Come back here in a few weeks for you Covid booster and flu shot This is a list of the screening recommended for you and due dates:  Health Maintenance  Topic Date Due  . Flu Shot  03/14/2020  . Cologuard (Stool DNA test)  08/09/2021  . Tetanus Vaccine  05/13/2029  . COVID-19 Vaccine  Completed  .  Hepatitis C: One time screening is recommended by Center for Disease Control  (CDC) for  adults born from [birth year] through 1965.   Completed  . Pneumonia vaccines  Completed

## 2020-05-10 NOTE — Progress Notes (Signed)
Ernest Hughes. is a 74 y.o. male who presents for annual wellness visit and follow-up on chronic medical conditions.  He has had continued difficulty with left knee pain.  He recently had an injection about a week ago and unfortunately the pain continues and is now interfering with his sleep.  He is taking some Tylenol for this.  He continues on his statin drug and is having no difficulty with that.  He is also taking Azor.  Presently he is not taking any medications for his reflux.  He does follow-up regularly with urology for his underlying prostate cancer and presently is on Flomax with good results.  He is also using Cialis for erectile dysfunction.  He has had cataracts removed from both eyes   Immunizations and Health Maintenance Immunization History  Administered Date(s) Administered  . Fluad Quad(high Dose 65+) 04/15/2019  . Influenza Split 05/22/2011, 05/02/2012  . Influenza Whole 08/05/2003, 06/11/2006  . Influenza, High Dose Seasonal PF 05/19/2013, 05/05/2014, 04/20/2015, 06/16/2016, 04/24/2017  . Influenza-Unspecified 06/14/2018  . PFIZER SARS-COV-2 Vaccination 09/03/2019, 09/24/2019  . PPD Test 11/01/2009  . Pneumococcal Conjugate-13 01/29/2008  . Pneumococcal Polysaccharide-23 05/05/2014  . Td 06/14/2005  . Tdap 05/14/2019  . Zoster 06/11/2006  . Zoster Recombinat (Shingrix) 11/07/2017   Health Maintenance Due  Topic Date Due  . INFLUENZA VACCINE  03/14/2020    Last colonoscopy: 05/28/15 Last PSA: 05/05/14 Dentist:Q six months Ophtho: Q six months Exercise: n/a  Other doctors caring for patient include: Dr Audria Nine urology  Advanced Directives: Does Patient Have a Medical Advance Directive?: Yes Type of Advance Directive: Healthcare Power of Attorney, Living will [place] in Chart?: Yes - validated most recent copy scanned in chart (See row information)  Depression screen:  See questionnaire below.     Depression  screen [facility] 2/9 05/10/2020 04/28/2019 12/12/2017 06/16/2016 05/10/2015  Decreased Interest 0 0 0 0 0  Down, Depressed, Hopeless 1 0 0 0 0  PHQ - 2 Score 1 0 0 0 0  Altered sleeping - - - - -  Tired, decreased energy - - - - -  PHQ-9 Score - - - - -    Fall Screen: See Questionaire below.   Fall Risk  05/10/2020 04/28/2019 06/16/2016 05/10/2015 05/10/2015  Falls in the past year? 0 0 No No No    ADL screen:  See questionnaire below.  Functional Status Survey: Is the patient deaf or have difficulty hearing?: No Does the patient have difficulty seeing, even when wearing glasses/contacts?: No Does the patient have difficulty concentrating, remembering, or making decisions?: No Does the patient have difficulty walking or climbing stairs?: Yes Does the patient have difficulty dressing or bathing?: No Does the patient have difficulty doing errands alone such as visiting a doctor's office or shopping?: No   Review of Systems  Constitutional: -, -unexpected weight change, -anorexia, -fatigue Allergy: -sneezing, -itching, -congestion Dermatology: denies changing moles, rash, lumps ENT: -runny nose, -ear pain, -sore throat,  Cardiology:  -chest pain, -palpitations, -orthopnea, Respiratory: -cough, -shortness of breath, -dyspnea on exertion, -wheezing,  Gastroenterology: -abdominal pain, -nausea, -vomiting, -diarrhea, -constipation, -dysphagia Hematology: -bleeding or bruising problems Musculoskeletal:, -myalgias, , -back pain, - Ophthalmology: -vision changes,  Urology: -dysuria, -difficulty urinating,  -urinary frequency, -urgency, incontinence Neurology: -, -numbness, , -memory loss, -falls, -dizziness    PHYSICAL EXAM:  General Appearance: Alert, cooperative, no distress, appears stated age Head: Normocephalic, without obvious abnormality, atraumatic Eyes: PERRL, conjunctiva/corneas clear, EOM's intact,  Ears: Normal TM's and external ear canals Nose: Nares normal, mucosa normal, no  drainage or sinus   tenderness Throat: Lips, mucosa, and tongue normal; teeth and gums normal Neck: Supple, no lymphadenopathy, thyroid:no enlargement/tenderness/nodules; no carotid bruit or JVD Lungs: Clear to auscultation bilaterally without wheezes, rales or ronchi; respirations unlabored Heart: Regular rate and rhythm, S1 and S2 normal, no murmur, rub or gallop Abdomen: Soft, non-tender, nondistended, normoactive bowel sounds, no masses, no hepatosplenomegaly Extremities: No clubbing, cyanosis or edema Pulses: 2+ and symmetric all extremities Skin: Skin color, texture, turgor normal, no rashes or lesions Lymph nodes: Cervical, supraclavicular, and axillary nodes normal Neurologic: CNII-XII intact, normal strength, sensation and gait; reflexes 2+ and symmetric throughout   Psych: Normal mood, affect, hygiene and grooming  ASSESSMENT/PLAN: Routine general medical examination at a health care facility - Plan: CBC with Differential/Platelet, Comprehensive metabolic panel, Lipid panel  History of prostate cancer  Gastroesophageal reflux disease without esophagitis  Essential hypertension - Plan: amLODipine-olmesartan (AZOR) 5-20 MG tablet, CBC with Differential/Platelet, Comprehensive metabolic panel  HYPERCHOLESTEROLEMIA - Plan: atorvastatin (LIPITOR) 10 MG tablet, Lipid panel  History of cataract surgery, unspecified laterality  Erectile dysfunction, unspecified erectile dysfunction type  Left knee pain, unspecified chronicity  Arthritis Continue on your present medications.  Use Tylenol and do not hesitate to also use Aleve for your pain.  Follow-up with Dr. Sharol Given concerning MRI and further work on your knee.  Continue on all your other medications.   Immunization recommendations discussed.  Colonoscopy recommendations reviewed. Come back here in a few weeks for a year Covid booster and flu shot  Medicare Attestation I have personally reviewed: The patient's medical and social  history Their use of alcohol, tobacco or illicit drugs Their current medications and supplements The patient's functional ability including ADLs,fall risks, home safety risks, cognitive, and hearing and visual impairment Diet and physical activities Evidence for depression or mood disorders  The patient's weight, height, and BMI have been recorded in the chart.  I have made referrals, counseling, and provided education to the patient based on review of the above and I have provided the patient with a written personalized care plan for preventive services.     Jill Alexanders, MD   05/10/2020

## 2020-05-11 LAB — CBC WITH DIFFERENTIAL/PLATELET
Basophils Absolute: 0 10*3/uL (ref 0.0–0.2)
Basos: 0 %
EOS (ABSOLUTE): 0 10*3/uL (ref 0.0–0.4)
Eos: 0 %
Hematocrit: 42.2 % (ref 37.5–51.0)
Hemoglobin: 14.1 g/dL (ref 13.0–17.7)
Immature Grans (Abs): 0 10*3/uL (ref 0.0–0.1)
Immature Granulocytes: 0 %
Lymphocytes Absolute: 1.3 10*3/uL (ref 0.7–3.1)
Lymphs: 26 %
MCH: 28.8 pg (ref 26.6–33.0)
MCHC: 33.4 g/dL (ref 31.5–35.7)
MCV: 86 fL (ref 79–97)
Monocytes Absolute: 0.4 10*3/uL (ref 0.1–0.9)
Monocytes: 7 %
Neutrophils Absolute: 3.3 10*3/uL (ref 1.4–7.0)
Neutrophils: 67 %
Platelets: 131 10*3/uL — ABNORMAL LOW (ref 150–450)
RBC: 4.89 x10E6/uL (ref 4.14–5.80)
RDW: 14 % (ref 11.6–15.4)
WBC: 4.9 10*3/uL (ref 3.4–10.8)

## 2020-05-11 LAB — COMPREHENSIVE METABOLIC PANEL
ALT: 20 IU/L (ref 0–44)
AST: 20 IU/L (ref 0–40)
Albumin/Globulin Ratio: 1.2 (ref 1.2–2.2)
Albumin: 3.6 g/dL — ABNORMAL LOW (ref 3.7–4.7)
Alkaline Phosphatase: 60 IU/L (ref 44–121)
BUN/Creatinine Ratio: 14 (ref 10–24)
BUN: 14 mg/dL (ref 8–27)
Bilirubin Total: 0.2 mg/dL (ref 0.0–1.2)
CO2: 25 mmol/L (ref 20–29)
Calcium: 8.4 mg/dL — ABNORMAL LOW (ref 8.6–10.2)
Chloride: 98 mmol/L (ref 96–106)
Creatinine, Ser: 1 mg/dL (ref 0.76–1.27)
GFR calc Af Amer: 85 mL/min/{1.73_m2} (ref >59)
GFR calc non Af Amer: 74 mL/min/{1.73_m2} (ref >59)
Globulin, Total: 3.1 g/dL (ref 1.5–4.5)
Glucose: 110 mg/dL — ABNORMAL HIGH (ref 65–99)
Potassium: 4.1 mmol/L (ref 3.5–5.2)
Sodium: 133 mmol/L — ABNORMAL LOW (ref 134–144)
Total Protein: 6.7 g/dL (ref 6.0–8.5)

## 2020-05-11 LAB — LIPID PANEL
Chol/HDL Ratio: 2.4 ratio (ref 0.0–5.0)
Cholesterol, Total: 117 mg/dL (ref 100–199)
HDL: 48 mg/dL (ref >39)
LDL Chol Calc (NIH): 53 mg/dL (ref 0–99)
Triglycerides: 81 mg/dL (ref 0–149)
VLDL Cholesterol Cal: 16 mg/dL (ref 5–40)

## 2020-05-12 LAB — NOVEL CORONAVIRUS, NAA: SARS-CoV-2, NAA: POSITIVE

## 2020-05-13 ENCOUNTER — Telehealth: Payer: Self-pay

## 2020-05-13 ENCOUNTER — Telehealth (INDEPENDENT_AMBULATORY_CARE_PROVIDER_SITE_OTHER): Payer: Medicare PPO | Admitting: Family Medicine

## 2020-05-13 ENCOUNTER — Encounter: Payer: Self-pay | Admitting: Family Medicine

## 2020-05-13 ENCOUNTER — Other Ambulatory Visit: Payer: Self-pay

## 2020-05-13 VITALS — Temp 96.7°F | Wt 186.0 lb

## 2020-05-13 DIAGNOSIS — E7439 Other disorders of intestinal carbohydrate absorption: Secondary | ICD-10-CM

## 2020-05-13 DIAGNOSIS — U071 COVID-19: Secondary | ICD-10-CM | POA: Diagnosis not present

## 2020-05-13 LAB — HGB A1C W/O EAG: Hgb A1c MFr Bld: 6.1 % — ABNORMAL HIGH (ref 4.8–5.6)

## 2020-05-13 LAB — SPECIMEN STATUS REPORT

## 2020-05-13 NOTE — Progress Notes (Signed)
   Subjective:    Patient ID: Ernest Ora., male    DOB: 1945-08-31, 74 y.o.   MRN: [id number]  HPI I connected with  Ernest Ora. on 05/13/20 by a video enabled telemedicine application and verified that I am speaking with the correct person using two identifiers.  [place] used he is at home and I am in my office. I discussed the limitations of evaluation and management by telemedicine. The patient expressed understanding and agreed to proceed. He was seen on September 27 here in the office however after that he noted a slight headache with watery eyes and some myalgias and was tested for Covid on September 29.  The test was positive.  He has no cough, congestion, shortness of breath, smell or taste change.  He has had his vaccines. Also had blood work on the 27th showed an elevated blood sugar with a hemoglobin A1c of 6.1.   Review of Systems     Objective:   Physical Exam Alert and in no distress otherwise not examined       Assessment & Plan:  COVID-19  Glucose intolerance Cautioned on the the need to stay isolated for 10 days.  He will be vigilant concerning cough, congestion, fever, shortness of breath.  I will set him up for IV antibodies I then discussed his glucose intolerance in regard to diet, exercise.  Strongly encouraged him to continue with this and we would continue to monitor it on a every 6 months to year basis.

## 2020-05-13 NOTE — Telephone Encounter (Signed)
LVM for pt to advise of the need for a virtual appt to go over labs. Ernest Hughes

## 2020-05-14 ENCOUNTER — Telehealth: Payer: Self-pay

## 2020-05-14 ENCOUNTER — Other Ambulatory Visit: Payer: Self-pay | Admitting: Physician Assistant

## 2020-05-14 DIAGNOSIS — I10 Essential (primary) hypertension: Secondary | ICD-10-CM

## 2020-05-14 DIAGNOSIS — U071 COVID-19: Secondary | ICD-10-CM

## 2020-05-14 DIAGNOSIS — E663 Overweight: Secondary | ICD-10-CM

## 2020-05-14 NOTE — Telephone Encounter (Signed)
[facility] called to have pt seen. [place]

## 2020-05-14 NOTE — Progress Notes (Signed)
I connected by phone with Ernest Hughes. on 05/14/2020 at 4:26 PM to discuss the potential use of a new treatment for mild to moderate COVID-19 viral infection in non-hospitalized patients.  This patient is a 73 y.o. male that meets the FDA criteria for Emergency Use Authorization of COVID monoclonal antibody casirivimab/imdevimab or bamlanivimab/eteseviamb.  Has a (+) direct SARS-CoV-2 viral test result  Has mild or moderate COVID-19   Is NOT hospitalized due to COVID-19  Is within 10 days of symptom onset  Has at least one of the high risk factor(s) for progression to severe COVID-19 and/or hospitalization as defined in EUA.  Specific high risk criteria : Older age (>/= 74 yo), BMI > 25 and Cardiovascular disease or hypertension   I have spoken and communicated the following to the patient or parent/caregiver regarding COVID monoclonal antibody treatment:  1. FDA has authorized the emergency use for the treatment of mild to moderate COVID-19 in adults and pediatric patients with positive results of direct SARS-CoV-2 viral testing who are 17 years of age and older weighing at least 40 kg, and who are at high risk for progressing to severe COVID-19 and/or hospitalization.  2. The significant known and potential risks and benefits of COVID monoclonal antibody, and the extent to which such potential risks and benefits are unknown.  3. Information on available alternative treatments and the risks and benefits of those alternatives, including clinical trials.  4. Patients treated with COVID monoclonal antibody should continue to self-isolate and use infection control measures (e.g., wear mask, isolate, social distance, avoid sharing personal items, clean and disinfect "high touch" surfaces, and frequent handwashing) according to CDC guidelines.   5. The patient or parent/caregiver has the option to accept or refuse COVID monoclonal antibody treatment.  After reviewing this information  with the patient, the patient has agreed to receive one of the available covid 19 monoclonal antibodies and will be provided an appropriate fact sheet prior to infusion. [place], [place] 05/14/2020 4:26 PM

## 2020-05-15 ENCOUNTER — Ambulatory Visit (HOSPITAL_COMMUNITY)
Admission: RE | Admit: 2020-05-15 | Discharge: 2020-05-15 | Disposition: A | Discharge status: Home / Self Care | Payer: Medicare Other | Source: Ambulatory Visit | Attending: Pulmonary Disease | Admitting: Pulmonary Disease

## 2020-05-15 ENCOUNTER — Encounter: Payer: Self-pay | Admitting: Orthopedic Surgery

## 2020-05-15 DIAGNOSIS — Z23 Encounter for immunization: Secondary | ICD-10-CM | POA: Insufficient documentation

## 2020-05-15 DIAGNOSIS — U071 COVID-19: Secondary | ICD-10-CM | POA: Diagnosis present

## 2020-05-15 DIAGNOSIS — E663 Overweight: Secondary | ICD-10-CM | POA: Diagnosis present

## 2020-05-15 DIAGNOSIS — I10 Essential (primary) hypertension: Secondary | ICD-10-CM | POA: Diagnosis present

## 2020-05-15 MED ORDER — EPINEPHRINE 0.3 MG/0.3ML IJ SOAJ: 0.3000 mg | Freq: Once | INTRAMUSCULAR | Status: DC | PRN

## 2020-05-15 MED ORDER — METHYLPREDNISOLONE SODIUM SUCC 125 MG IJ SOLR: 125.0000 mg | Freq: Once | INTRAMUSCULAR | Status: DC | PRN

## 2020-05-15 MED ORDER — SODIUM CHLORIDE 0.9 % IV SOLN: INTRAVENOUS | Status: DC | PRN

## 2020-05-15 MED ORDER — SODIUM CHLORIDE 0.9 % IV SOLN
1200.0000 mg | Freq: Once | INTRAVENOUS | Status: AC
  Administered 2020-05-15: 1200 mg via INTRAVENOUS

## 2020-05-15 MED ORDER — FAMOTIDINE IN NACL 20-0.9 MG/50ML-% IV SOLN: 20.0000 mg | Freq: Once | INTRAVENOUS | Status: DC | PRN

## 2020-05-15 MED ORDER — DIPHENHYDRAMINE HCL 50 MG/ML IJ SOLN: 50.0000 mg | Freq: Once | INTRAMUSCULAR | Status: DC | PRN

## 2020-05-15 MED ORDER — ALBUTEROL SULFATE HFA 108 (90 BASE) MCG/ACT IN AERS: 2.0000 | INHALATION_SPRAY | Freq: Once | RESPIRATORY_TRACT | Status: DC | PRN

## 2020-05-15 NOTE — Discharge Instructions (Signed)

## 2020-05-15 NOTE — Progress Notes (Signed)
  Diagnosis: COVID-19  Physician: Dr Joya Gaskins  Procedure: [facility] Infusion [facility] Med: casirivimab\imdevimab infusion - Provided patient with casirivimab\imdevimab fact sheet for patients, parents and caregivers prior to infusion.  Complications: No immediate complications noted.  Discharge: Discharged home   Ashley Murrain 05/15/2020

## 2020-05-17 ENCOUNTER — Ambulatory Visit: Payer: Medicare PPO | Admitting: Orthopedic Surgery

## 2020-05-18 ENCOUNTER — Telehealth: Payer: Medicare PPO | Admitting: Family Medicine

## 2020-05-18 ENCOUNTER — Other Ambulatory Visit: Payer: Self-pay

## 2020-05-18 VITALS — Temp 97.0°F | Wt 186.0 lb

## 2020-05-18 DIAGNOSIS — M25562 Pain in left knee: Secondary | ICD-10-CM | POA: Diagnosis not present

## 2020-05-18 DIAGNOSIS — U071 COVID-19: Secondary | ICD-10-CM

## 2020-05-18 NOTE — Progress Notes (Signed)
   Subjective:    Patient ID: Ernest Ora., male    DOB: 03-14-1946, 74 y.o.   MRN: [id number]  HPI I connected with  Ernest Ora. on 05/18/20 by a video enabled telemedicine application and verified that I am speaking with the correct person using two identifiers.  Caregility used.  I am in my office and he is at home. I discussed the limitations of evaluation and management by telemedicine. The patient expressed understanding and agreed to proceed. He was positive for Covid having very little symptoms but did get the IV infusion last Saturday.  He was told that he needs to stay isolated until Friday of this week.  Presently the only symptoms he is having is occasional sneezing. He continues have difficulty with his knee but finds it 2 Aleve per day as well as 2 Tylenol 4 times per day as relieving his symptoms.  He had to cancel an appointment with Dr. Sharol Given due to [facility].  He has questions concerning booster and getting the flu shot.   Review of Systems     Objective:   Physical Exam Alert and in no distress otherwise not examined       Assessment & Plan:  COVID-19  Left knee pain, unspecified chronicity Recommend getting the flu shot anytime after the 10 days of isolation.  Also discussed the booster and I would wait at least 90 days to get the booster shot.  He can schedule an appointment with Dr. Sharol Given anytime after his 10 days of isolation.  He will continue on his Tylenol and Aleve regimen. 9-1/2 minutes spent discussing these issues with him.

## 2020-05-24 ENCOUNTER — Ambulatory Visit: Payer: Medicare PPO | Admitting: Orthopedic Surgery

## 2020-05-25 ENCOUNTER — Other Ambulatory Visit (INDEPENDENT_AMBULATORY_CARE_PROVIDER_SITE_OTHER): Payer: Medicare PPO

## 2020-05-25 ENCOUNTER — Other Ambulatory Visit: Payer: Self-pay

## 2020-05-25 DIAGNOSIS — Z23 Encounter for immunization: Secondary | ICD-10-CM

## 2020-05-27 ENCOUNTER — Encounter: Payer: Self-pay | Admitting: Orthopedic Surgery

## 2020-05-27 ENCOUNTER — Ambulatory Visit (INDEPENDENT_AMBULATORY_CARE_PROVIDER_SITE_OTHER): Payer: Medicare PPO | Admitting: Physician Assistant

## 2020-05-27 DIAGNOSIS — M25562 Pain in left knee: Secondary | ICD-10-CM

## 2020-05-27 NOTE — Progress Notes (Signed)
Office Visit Note   Patient: Ernest Hughes.           Date of Birth: Oct 10, 1945           MRN: [id number] Visit Date: 05/27/2020              Requested by: Denita Lung, MD [address] [place],  [place] [address] PCP: Denita Lung, MD  Chief Complaint  Patient presents with  . Left Knee - Pain      HPI: Patient is a pleasant 74 year old gentleman with continued left medial knee pain.  He was seen by Dr. Sharol Given a few weeks ago.  He did receive a steroid injection which offered him only a very limited amount of relief for short period of time.  He did not have any problems with his knee until couple months ago.  The knee pain is focused over the medial joint line.  He does get some mechanical symptoms of locking and catching.  This occurs especially going downstairs.  His neck x-ray demonstrated only minimal degenerative changes of the medial compartment  Assessment & Plan: Visit Diagnoses:  1. Acute pain of left knee     Plan: At this point I recommend an MRI of his left knee follow-up with Dr. Sharol Given once this is completed  Follow-Up Instructions: No follow-ups on file.   Ortho Exam  Patient is alert, oriented, no adenopathy, well-dressed, normal affect, normal respiratory effort. Left knee no effusion no cellulitis minimal soft tissue swelling.  He is acutely tender over the medial joint line.  This is accentuated with flexion and extension.  Imaging: No results found. No images are attached to the encounter.  Labs: Lab Results  Component Value Date   HGBA1C 6.1 (H) 05/10/2020     Lab Results  Component Value Date   ALBUMIN 3.6 (L) 05/10/2020   ALBUMIN 4.0 04/28/2019   ALBUMIN 3.8 12/12/2017    No results found for: MG No results found for: VD25OH  No results found for: PREALBUMIN CBC EXTENDED Latest Ref Rng & Units 05/10/2020 04/28/2019 12/12/2017  WBC 3.4 - 10.8 x10E3/uL 4.9 4.9 4.6  RBC 4.14 - 5.80 x10E6/uL 4.89 4.97 4.92  HGB 13.0 - 17.7  g/dL 14.1 14.6 13.7  HCT 37.5 - 51.0 % 42.2 43.0 43.0  PLT 150 - 450 x10E3/uL 131(L) 183 192  NEUTROABS 1 - 7 x10E3/uL 3.3 2.6 2.0  LYMPHSABS 0 - 3 x10E3/uL 1.3 2.0 2.2     There is no height or weight on file to calculate BMI.  Orders:  Orders Placed This Encounter  Procedures  . MR Knee Left w/o contrast   No orders of the defined types were placed in this encounter.    Procedures: No procedures performed  Clinical Data: No additional findings.  ROS:  All other systems negative, except as noted in the HPI. Review of Systems  Objective: Vital Signs: There were no vitals taken for this visit.  Specialty Comments:  No specialty comments available.  PMFS History: Patient Active Problem List   Diagnosis Date Noted  . Arthritis 05/10/2020  . History of cataract surgery, unspecified laterality 04/28/2019  . Erectile dysfunction 04/28/2019  . Onychomycosis 05/05/2014  . History of prostate cancer 05/22/2011  . GERD 02/02/2009  . HYPERCHOLESTEROLEMIA 02/01/2009  . Essential hypertension 02/01/2009  . Diverticulosis of colon 02/01/2009   Past Medical History:  Diagnosis Date  . BPH (benign prostatic hyperplasia)   . Cancer ([place])    PROSTATE--treated  with radiation  . Diverticulosis   . GERD (gastroesophageal reflux disease)   . Hypertension     Family History  Problem Relation Age of Onset  . Cancer Mother   . Cancer Father   . Heart disease Sister   . Cancer Maternal Aunt   . Cancer Paternal Aunt   . Cancer Paternal Grandfather     Past Surgical History:  Procedure Laterality Date  . BELPHAROPTOSIS REPAIR  12/2013   bilateral  . cataract surgery Left 2019  . COLONOSCOPY     Dr. Sharlett Iles   Social History   Occupational History  . Not on file  Tobacco Use  . Smoking status: Never Smoker  . Smokeless tobacco: Never Used  Substance and Sexual Activity  . Alcohol use: Yes    Comment: has some wine once in a while   . Drug use: No  . Sexual  activity: Not Currently

## 2020-06-15 ENCOUNTER — Other Ambulatory Visit: Payer: Self-pay

## 2020-06-15 ENCOUNTER — Ambulatory Visit
Admission: RE | Admit: 2020-06-15 | Discharge: 2020-06-15 | Disposition: A | Discharge status: Home / Self Care | Payer: Medicare PPO | Source: Ambulatory Visit | Attending: Physician Assistant | Admitting: Physician Assistant

## 2020-06-15 DIAGNOSIS — M25562 Pain in left knee: Secondary | ICD-10-CM

## 2020-06-15 DIAGNOSIS — S83242A Other tear of medial meniscus, current injury, left knee, initial encounter: Secondary | ICD-10-CM | POA: Diagnosis not present

## 2020-06-15 DIAGNOSIS — M1712 Unilateral primary osteoarthritis, left knee: Secondary | ICD-10-CM | POA: Diagnosis not present

## 2020-06-15 DIAGNOSIS — M7122 Synovial cyst of popliteal space [Baker], left knee: Secondary | ICD-10-CM | POA: Diagnosis not present

## 2020-06-15 DIAGNOSIS — M7989 Other specified soft tissue disorders: Secondary | ICD-10-CM | POA: Diagnosis not present

## 2020-06-15 IMAGING — MR MR KNEE*L* W/O CM
4 of 6 series · 21 of 40 positions shown · non-contrast
Comparison: Radiographs [DATE].

CLINICAL DATA: Knee pain and swelling for 1 month. No known injury
or prior relevant surgery.

EXAM:
MRI OF THE LEFT KNEE WITHOUT CONTRAST
TECHNIQUE: Multiplanar, multisequence MR imaging of the knee was performed. No
intravenous contrast was administered.

[Series 3: T2 fat-sat · axial · 4.0mm · 0.50mm/px · z∈[-55,+40]mm · 3 of 24 slices shown (1 of 2)]
[im 5/24]
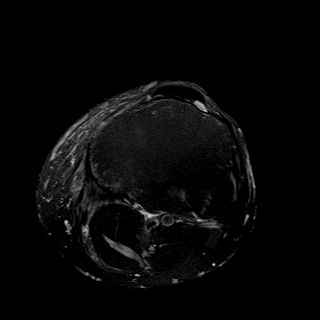
[im 14/24]
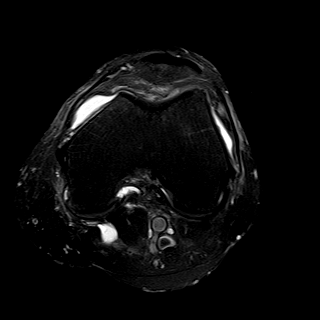
[im 24/24]
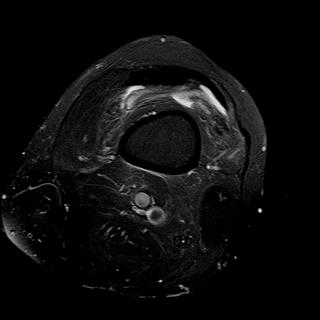

[Series 5: T2 fat-sat · coronal · 4.0mm · 0.29mm/px · 3 of 24 slices shown (2 of 2)]
[im 5/24]
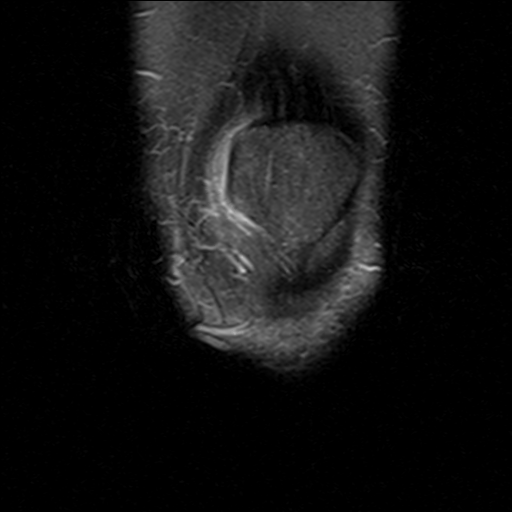
[im 14/24]
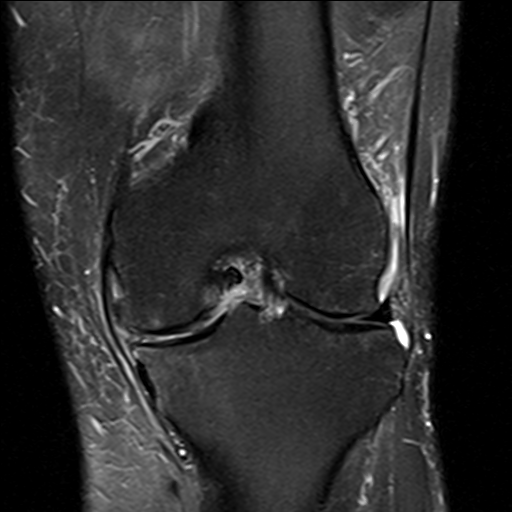
[im 24/24]
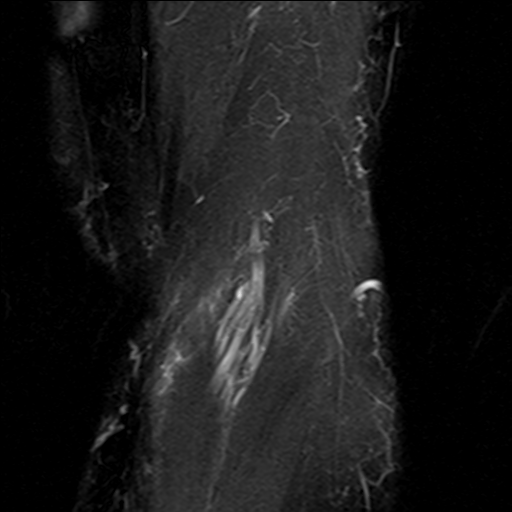

[Series 7: PD fat-sat · sagittal · 3.0mm · 0.29mm/px · 7 of 27 slices shown (1 of 2)]
[im 1/27]
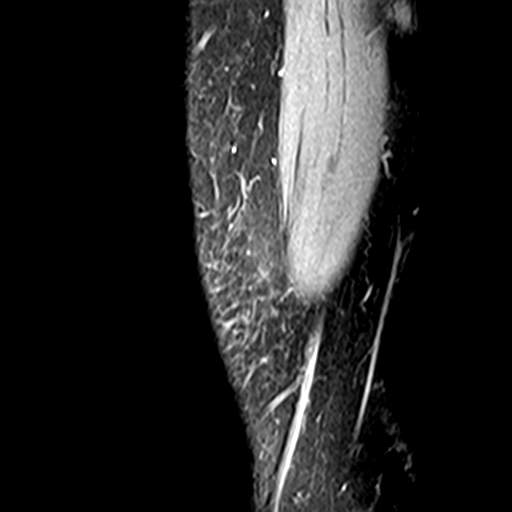
[im 5/27]
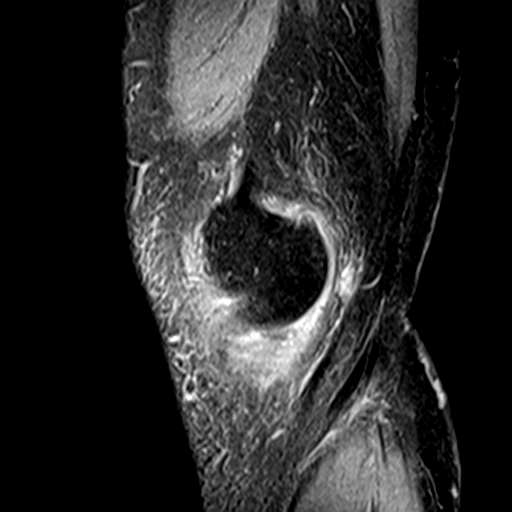
[im 9/27]
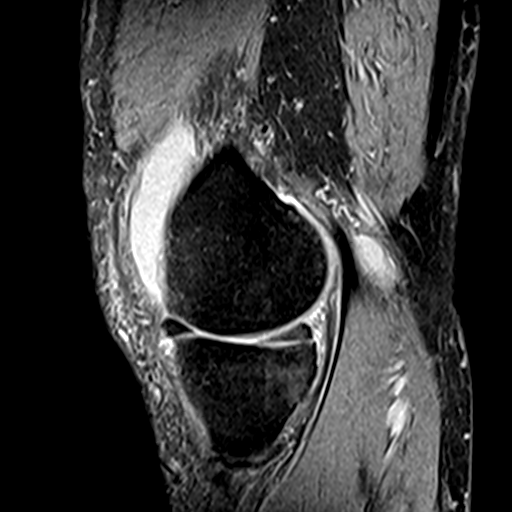
[im 14/27]
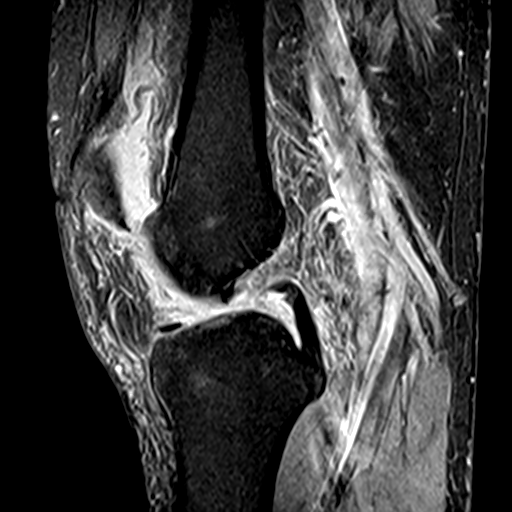
[im 18/27]
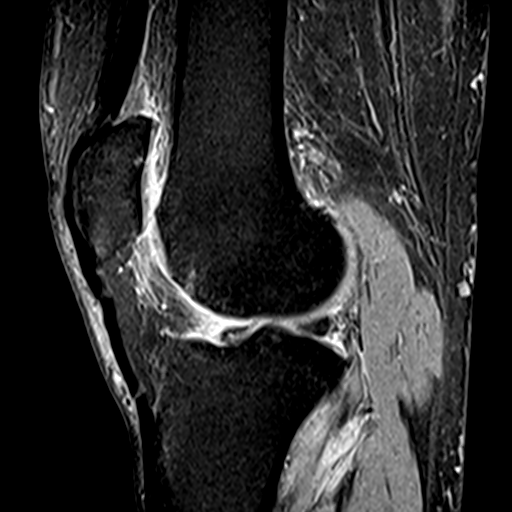
[im 22/27]
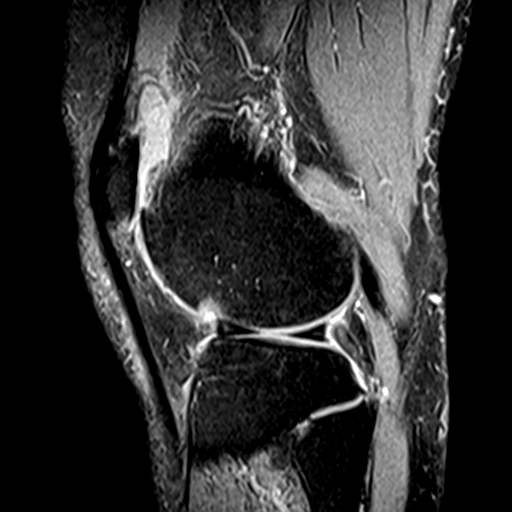
[im 27/27]
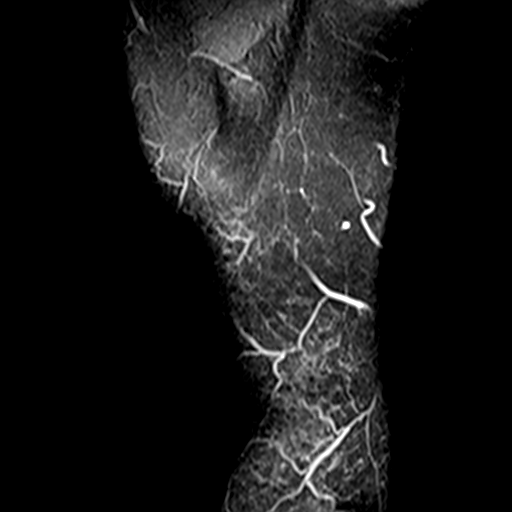

[Series 8: PD fat-sat · coronal · 3.0mm · 0.29mm/px · 8 of 30 slices shown (2 of 2)]
[im 1/30]
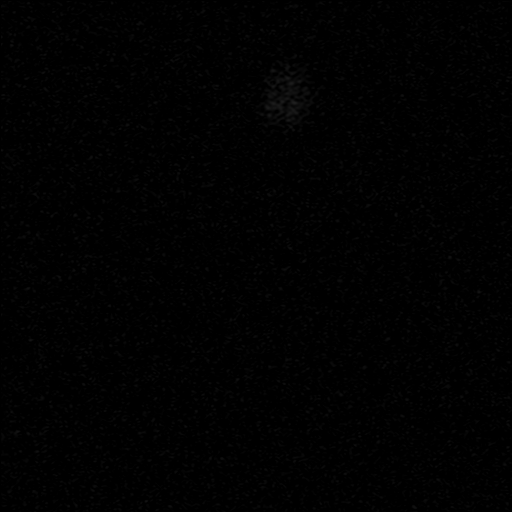
[im 5/30]
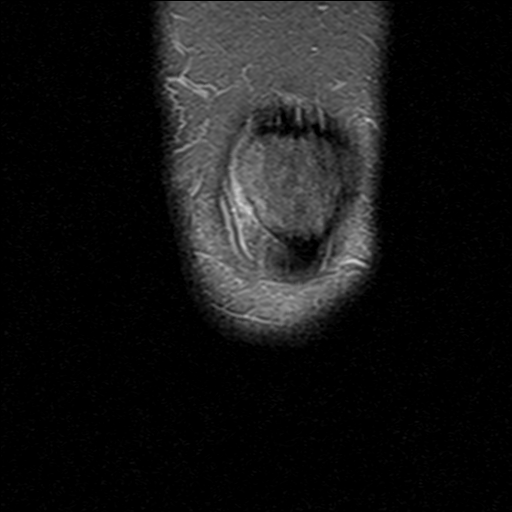
[im 9/30]
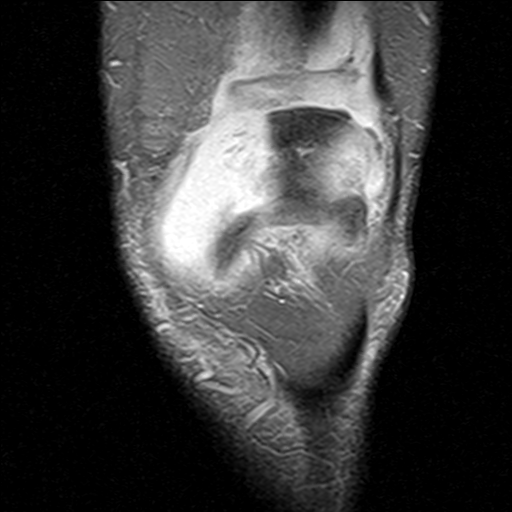
[im 13/30]
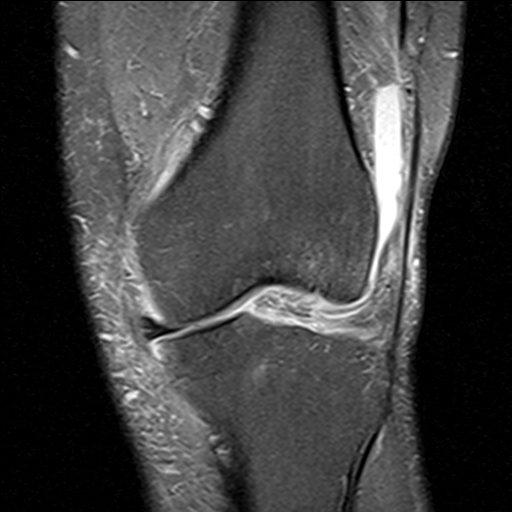
[im 17/30]
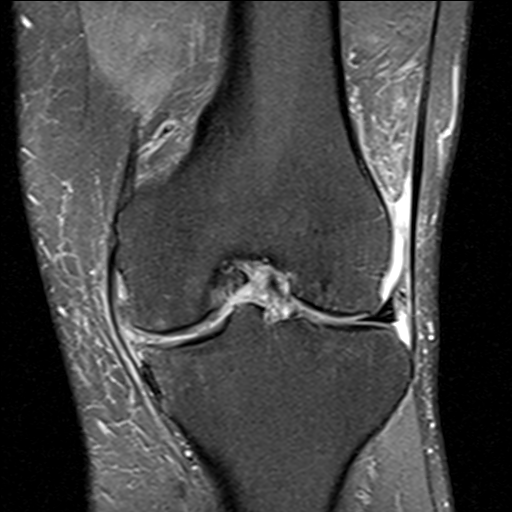
[im 21/30]
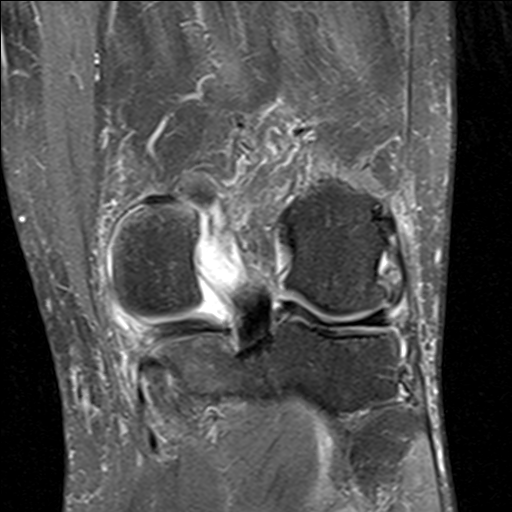
[im 25/30]
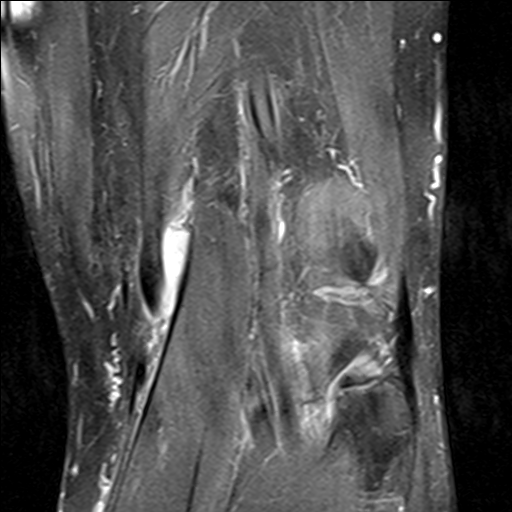
[im 30/30]
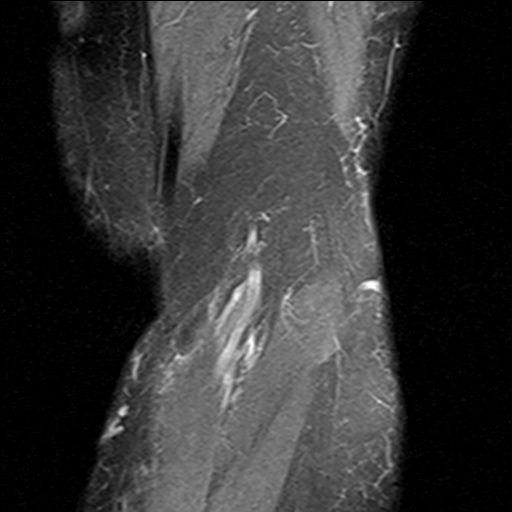

[21 of 40 positions shown; findings below may reference images not displayed]

FINDINGS: MENISCI

Medial meniscus: There is fairly extensive tearing of the posterior
horn and body with a large radial component in the meniscal body,
best seen on coronal images 13 and 14. The meniscal root is intact.
No centrally displaced meniscal fragment.

Lateral meniscus:  Intact with normal morphology.

LIGAMENTS

Cruciates:  Intact.

Collaterals:  Intact.

CARTILAGE

Patellofemoral: Mild chondral thinning and surface irregularity with
asymmetric subchondral cyst formation laterally in the trochlea.

Medial:  Mild chondral thinning and surface irregularity.

Lateral:  Mild chondral thinning and surface irregularity.

MISCELLANEOUS

Joint:  Moderate-sized joint effusion.

Popliteal Fossa:  Small Baker's cyst.

Extensor Mechanism:  Intact.

Bones:  No acute or significant extra-articular osseous findings.

Other: No other significant periarticular soft tissue findings.
IMPRESSION: 1. Fairly extensive tearing of the posterior horn and body of the
medial meniscus with a large radial component in the meniscal body.
2. Mild tricompartmental degenerative changes. No acute osseous
findings.
3. Moderate-sized joint effusion and small Baker's cyst.
4. The lateral meniscus, cruciate and collateral ligaments are
intact.

## 2020-06-17 ENCOUNTER — Encounter: Payer: Self-pay | Admitting: Orthopedic Surgery

## 2020-06-17 ENCOUNTER — Ambulatory Visit: Payer: Medicare PPO | Admitting: Physician Assistant

## 2020-06-17 DIAGNOSIS — S83241D Other tear of medial meniscus, current injury, right knee, subsequent encounter: Secondary | ICD-10-CM

## 2020-06-17 NOTE — Progress Notes (Signed)
Office Visit Note   Patient: Ernest Hughes.           Date of Birth: March 20, 1946           MRN: [id number] Visit Date: 06/17/2020              Requested by: Denita Lung, MD [address] [place],  [place] [address] PCP: Denita Lung, MD  Chief Complaint  Patient presents with  . Left Knee - Follow-up    MRI review       HPI: This is a pleasant active 74 year old gentleman with a 60-month history of left medial knee pain.  He does not recall an injury but before this he was enjoying walking and now this is difficult for him to do.  He did try to steroid injection which was minimally helpful.  At his last visit giving his mechanical symptoms an MRI was ordered he is here to review this today  Assessment & Plan: Visit Diagnoses: No diagnosis found.  Plan: Patient was seen by Dr. Sharol Given once again we reviewed the risks with the knee arthroscopy and told him that this could definitely get rid of some of the sharp pain that he is having in his knee.  May explain the risks with the surgery.  If he develop more arthritic symptoms that were limiting he might need at some point a knee replacement he is going to discuss this with his wife in the meantime emphasized the importance of keeping his lower extremities strong  Follow-Up Instructions: No follow-ups on file.   Ortho Exam  Patient is alert, oriented, no adenopathy, well-dressed, normal affect, normal respiratory effort. Left knee mild effusion no cellulitis no erythema acutely tender over the medial joint line.  MRI was reviewed today demonstrates some chondral thinning in all 3 compartments.  Mild arthritic symptoms and changes.  He does have a complex tear of the medial meniscus.  Imaging: No results found. No images are attached to the encounter.  Labs: Lab Results  Component Value Date   HGBA1C 6.1 (H) 05/10/2020     Lab Results  Component Value Date   ALBUMIN 3.6 (L) 05/10/2020   ALBUMIN 4.0 04/28/2019    ALBUMIN 3.8 12/12/2017    No results found for: MG No results found for: VD25OH  No results found for: PREALBUMIN CBC EXTENDED Latest Ref Rng & Units 05/10/2020 04/28/2019 12/12/2017  WBC 3.4 - 10.8 x10E3/uL 4.9 4.9 4.6  RBC 4.14 - 5.80 x10E6/uL 4.89 4.97 4.92  HGB 13.0 - 17.7 g/dL 14.1 14.6 13.7  HCT 37.5 - 51.0 % 42.2 43.0 43.0  PLT 150 - 450 x10E3/uL 131(L) 183 192  NEUTROABS 1.40 - 7.00 x10E3/uL 3.3 2.6 2.0  LYMPHSABS 0 - 3 x10E3/uL 1.3 2.0 2.2     There is no height or weight on file to calculate BMI.  Orders:  No orders of the defined types were placed in this encounter.  No orders of the defined types were placed in this encounter.    Procedures: No procedures performed  Clinical Data: No additional findings.  ROS:  All other systems negative, except as noted in the HPI. Review of Systems  Objective: Vital Signs: There were no vitals taken for this visit.  Specialty Comments:  No specialty comments available.  PMFS History: Patient Active Problem List   Diagnosis Date Noted  . Arthritis 05/10/2020  . History of cataract surgery, unspecified laterality 04/28/2019  . Erectile dysfunction 04/28/2019  . Onychomycosis  05/05/2014  . History of prostate cancer 05/22/2011  . GERD 02/02/2009  . HYPERCHOLESTEROLEMIA 02/01/2009  . Essential hypertension 02/01/2009  . Diverticulosis of colon 02/01/2009   Past Medical History:  Diagnosis Date  . BPH (benign prostatic hyperplasia)   . Cancer ([place])    PROSTATE--treated with radiation  . Diverticulosis   . GERD (gastroesophageal reflux disease)   . Hypertension     Family History  Problem Relation Age of Onset  . Cancer Mother   . Cancer Father   . Heart disease Sister   . Cancer Maternal Aunt   . Cancer Paternal Aunt   . Cancer Paternal Grandfather     Past Surgical History:  Procedure Laterality Date  . BELPHAROPTOSIS REPAIR  12/2013   bilateral  . cataract surgery Left 2019  . COLONOSCOPY      Dr. Sharlett Iles   Social History   Occupational History  . Not on file  Tobacco Use  . Smoking status: Never Smoker  . Smokeless tobacco: Never Used  Substance and Sexual Activity  . Alcohol use: Yes    Comment: has some wine once in a while   . Drug use: No  . Sexual activity: Not Currently

## 2020-06-25 DIAGNOSIS — M25562 Pain in left knee: Secondary | ICD-10-CM | POA: Diagnosis not present

## 2020-06-30 DIAGNOSIS — H04123 Dry eye syndrome of bilateral lacrimal glands: Secondary | ICD-10-CM | POA: Diagnosis not present

## 2020-06-30 DIAGNOSIS — H47323 Drusen of optic disc, bilateral: Secondary | ICD-10-CM | POA: Diagnosis not present

## 2020-06-30 DIAGNOSIS — H10413 Chronic giant papillary conjunctivitis, bilateral: Secondary | ICD-10-CM | POA: Diagnosis not present

## 2020-06-30 DIAGNOSIS — Z961 Presence of intraocular lens: Secondary | ICD-10-CM | POA: Diagnosis not present

## 2020-06-30 DIAGNOSIS — H43811 Vitreous degeneration, right eye: Secondary | ICD-10-CM | POA: Diagnosis not present

## 2020-07-01 DIAGNOSIS — G8918 Other acute postprocedural pain: Secondary | ICD-10-CM | POA: Diagnosis not present

## 2020-07-01 DIAGNOSIS — S83242A Other tear of medial meniscus, current injury, left knee, initial encounter: Secondary | ICD-10-CM | POA: Diagnosis not present

## 2020-07-01 DIAGNOSIS — M1712 Unilateral primary osteoarthritis, left knee: Secondary | ICD-10-CM | POA: Diagnosis not present

## 2020-07-01 DIAGNOSIS — M2242 Chondromalacia patellae, left knee: Secondary | ICD-10-CM | POA: Diagnosis not present

## 2020-07-01 DIAGNOSIS — S83282A Other tear of lateral meniscus, current injury, left knee, initial encounter: Secondary | ICD-10-CM | POA: Diagnosis not present

## 2020-07-01 DIAGNOSIS — S83232A Complex tear of medial meniscus, current injury, left knee, initial encounter: Secondary | ICD-10-CM | POA: Diagnosis not present

## 2020-07-13 ENCOUNTER — Telehealth: Payer: Self-pay | Admitting: Family Medicine

## 2020-07-13 NOTE — Telephone Encounter (Signed)
It is best to wait till the end of December.

## 2020-07-13 NOTE — Telephone Encounter (Signed)
LVM advising pt of message. [place]

## 2020-07-13 NOTE — Telephone Encounter (Signed)
Pt called and ask if he should go ahead and get booster due to new strain. He states that when he had infusion he was told to wait until end of December. Please advise pt at [phone].

## 2020-07-14 NOTE — Telephone Encounter (Signed)
Pt was advised [facility] 

## 2020-08-09 DIAGNOSIS — R262 Difficulty in walking, not elsewhere classified: Secondary | ICD-10-CM | POA: Diagnosis not present

## 2020-08-09 DIAGNOSIS — M6281 Muscle weakness (generalized): Secondary | ICD-10-CM | POA: Diagnosis not present

## 2020-08-09 DIAGNOSIS — M94262 Chondromalacia, left knee: Secondary | ICD-10-CM | POA: Diagnosis not present

## 2020-08-09 DIAGNOSIS — M25462 Effusion, left knee: Secondary | ICD-10-CM | POA: Diagnosis not present

## 2020-08-10 ENCOUNTER — Ambulatory Visit (INDEPENDENT_AMBULATORY_CARE_PROVIDER_SITE_OTHER): Payer: Medicare PPO

## 2020-08-10 DIAGNOSIS — Z23 Encounter for immunization: Secondary | ICD-10-CM | POA: Diagnosis not present

## 2020-08-16 ENCOUNTER — Other Ambulatory Visit: Payer: Self-pay | Admitting: Family Medicine

## 2020-08-16 DIAGNOSIS — E7800 Pure hypercholesterolemia, unspecified: Secondary | ICD-10-CM

## 2020-08-17 DIAGNOSIS — M6281 Muscle weakness (generalized): Secondary | ICD-10-CM | POA: Diagnosis not present

## 2020-08-17 DIAGNOSIS — R262 Difficulty in walking, not elsewhere classified: Secondary | ICD-10-CM | POA: Diagnosis not present

## 2020-08-17 DIAGNOSIS — M25462 Effusion, left knee: Secondary | ICD-10-CM | POA: Diagnosis not present

## 2020-08-17 DIAGNOSIS — M94262 Chondromalacia, left knee: Secondary | ICD-10-CM | POA: Diagnosis not present

## 2020-08-19 DIAGNOSIS — M94262 Chondromalacia, left knee: Secondary | ICD-10-CM | POA: Diagnosis not present

## 2020-08-19 DIAGNOSIS — R262 Difficulty in walking, not elsewhere classified: Secondary | ICD-10-CM | POA: Diagnosis not present

## 2020-08-19 DIAGNOSIS — M25462 Effusion, left knee: Secondary | ICD-10-CM | POA: Diagnosis not present

## 2020-08-19 DIAGNOSIS — M6281 Muscle weakness (generalized): Secondary | ICD-10-CM | POA: Diagnosis not present

## 2020-08-24 DIAGNOSIS — M94262 Chondromalacia, left knee: Secondary | ICD-10-CM | POA: Diagnosis not present

## 2020-08-24 DIAGNOSIS — R262 Difficulty in walking, not elsewhere classified: Secondary | ICD-10-CM | POA: Diagnosis not present

## 2020-08-24 DIAGNOSIS — M6281 Muscle weakness (generalized): Secondary | ICD-10-CM | POA: Diagnosis not present

## 2020-08-24 DIAGNOSIS — M25462 Effusion, left knee: Secondary | ICD-10-CM | POA: Diagnosis not present

## 2020-08-26 DIAGNOSIS — R262 Difficulty in walking, not elsewhere classified: Secondary | ICD-10-CM | POA: Diagnosis not present

## 2020-08-26 DIAGNOSIS — M94262 Chondromalacia, left knee: Secondary | ICD-10-CM | POA: Diagnosis not present

## 2020-08-26 DIAGNOSIS — M6281 Muscle weakness (generalized): Secondary | ICD-10-CM | POA: Diagnosis not present

## 2020-08-26 DIAGNOSIS — M25462 Effusion, left knee: Secondary | ICD-10-CM | POA: Diagnosis not present

## 2020-09-02 DIAGNOSIS — M94262 Chondromalacia, left knee: Secondary | ICD-10-CM | POA: Diagnosis not present

## 2020-09-02 DIAGNOSIS — M25462 Effusion, left knee: Secondary | ICD-10-CM | POA: Diagnosis not present

## 2020-09-02 DIAGNOSIS — M6281 Muscle weakness (generalized): Secondary | ICD-10-CM | POA: Diagnosis not present

## 2020-09-02 DIAGNOSIS — R262 Difficulty in walking, not elsewhere classified: Secondary | ICD-10-CM | POA: Diagnosis not present

## 2020-09-07 DIAGNOSIS — R262 Difficulty in walking, not elsewhere classified: Secondary | ICD-10-CM | POA: Diagnosis not present

## 2020-09-07 DIAGNOSIS — M6281 Muscle weakness (generalized): Secondary | ICD-10-CM | POA: Diagnosis not present

## 2020-09-07 DIAGNOSIS — M94242 Chondromalacia, joints of left hand: Secondary | ICD-10-CM | POA: Diagnosis not present

## 2020-09-07 DIAGNOSIS — M25462 Effusion, left knee: Secondary | ICD-10-CM | POA: Diagnosis not present

## 2020-09-09 DIAGNOSIS — M6281 Muscle weakness (generalized): Secondary | ICD-10-CM | POA: Diagnosis not present

## 2020-09-09 DIAGNOSIS — R262 Difficulty in walking, not elsewhere classified: Secondary | ICD-10-CM | POA: Diagnosis not present

## 2020-09-09 DIAGNOSIS — M94262 Chondromalacia, left knee: Secondary | ICD-10-CM | POA: Diagnosis not present

## 2020-09-09 DIAGNOSIS — M25462 Effusion, left knee: Secondary | ICD-10-CM | POA: Diagnosis not present

## 2020-09-14 DIAGNOSIS — M94262 Chondromalacia, left knee: Secondary | ICD-10-CM | POA: Diagnosis not present

## 2020-09-14 DIAGNOSIS — M25462 Effusion, left knee: Secondary | ICD-10-CM | POA: Diagnosis not present

## 2020-09-14 DIAGNOSIS — M6281 Muscle weakness (generalized): Secondary | ICD-10-CM | POA: Diagnosis not present

## 2020-09-14 DIAGNOSIS — R262 Difficulty in walking, not elsewhere classified: Secondary | ICD-10-CM | POA: Diagnosis not present

## 2020-09-16 DIAGNOSIS — M25462 Effusion, left knee: Secondary | ICD-10-CM | POA: Diagnosis not present

## 2020-09-16 DIAGNOSIS — M6281 Muscle weakness (generalized): Secondary | ICD-10-CM | POA: Diagnosis not present

## 2020-09-16 DIAGNOSIS — R262 Difficulty in walking, not elsewhere classified: Secondary | ICD-10-CM | POA: Diagnosis not present

## 2020-09-16 DIAGNOSIS — M94262 Chondromalacia, left knee: Secondary | ICD-10-CM | POA: Diagnosis not present

## 2020-11-13 ENCOUNTER — Other Ambulatory Visit: Payer: Self-pay | Admitting: Family Medicine

## 2020-11-13 DIAGNOSIS — E7800 Pure hypercholesterolemia, unspecified: Secondary | ICD-10-CM

## 2020-11-18 ENCOUNTER — Ambulatory Visit: Payer: Medicare PPO | Admitting: Family Medicine

## 2020-11-18 ENCOUNTER — Other Ambulatory Visit: Payer: Self-pay

## 2020-11-18 VITALS — BP 146/88 | HR 79 | Temp 98.1°F | Wt 194.4 lb

## 2020-11-18 DIAGNOSIS — Z131 Encounter for screening for diabetes mellitus: Secondary | ICD-10-CM | POA: Diagnosis not present

## 2020-11-18 DIAGNOSIS — Z9889 Other specified postprocedural states: Secondary | ICD-10-CM

## 2020-11-18 DIAGNOSIS — R1319 Other dysphagia: Secondary | ICD-10-CM

## 2020-11-18 DIAGNOSIS — E7439 Other disorders of intestinal carbohydrate absorption: Secondary | ICD-10-CM | POA: Insufficient documentation

## 2020-11-18 LAB — POCT GLYCOSYLATED HEMOGLOBIN (HGB A1C): Hemoglobin A1C: 6.5 % — AB (ref 4.0–5.6)

## 2020-11-18 NOTE — Progress Notes (Signed)
   Subjective:    Patient ID: Ernest Hughes., male    DOB: 03-20-1946, 75 y.o.   MRN: [id number]  HPI He is here for recheck on glucose intolerance.  He has had recent knee surgery and has not made any major changes in his eating habits.  His last A1c was 6.1. He also has a 65-month history of difficulty with swallowing mainly solids.  He feels it gets stuck in his upper chest area and he is able to wash it down with water. He also complains of slight irritation in the gluteal cleft area that he has been using OTC meds without much success.   Review of Systems     Objective:   Physical Exam Exam of the gluteal area does show some pinkish nontender area in the cleft that is dry. Hemoglobin A1c is 6.5       Assessment & Plan:  Esophageal dysphagia - Plan: Ambulatory referral to Gastroenterology  Glucose intolerance - Plan: POCT glycosylated hemoglobin (Hb A1C)  Screening for diabetes mellitus  History of arthroscopic knee surgery I explained that I thought his symptoms were of esophageal stenosis and referral to GI would be appropriate.  He was comfortable with that. I then discussed the fact that his hemoglobin A1c is now at the border of diabetes.  Rather than labeling him this at present time, I recommend that he get more aggressive with diet and exercise.  He has had some recent knee surgery which has interfered with his ability to exercise.  Explained that he can work around this possibly [facility]. Over 30 minutes spent in counseling and coordination of care.

## 2020-11-24 DIAGNOSIS — C61 Malignant neoplasm of prostate: Secondary | ICD-10-CM | POA: Diagnosis not present

## 2020-11-29 DIAGNOSIS — N401 Enlarged prostate with lower urinary tract symptoms: Secondary | ICD-10-CM | POA: Diagnosis not present

## 2020-11-29 DIAGNOSIS — C61 Malignant neoplasm of prostate: Secondary | ICD-10-CM | POA: Diagnosis not present

## 2020-11-29 DIAGNOSIS — R351 Nocturia: Secondary | ICD-10-CM | POA: Diagnosis not present

## 2020-12-01 ENCOUNTER — Other Ambulatory Visit: Payer: Self-pay | Admitting: Urology

## 2020-12-09 ENCOUNTER — Other Ambulatory Visit: Payer: Self-pay

## 2020-12-09 ENCOUNTER — Ambulatory Visit (INDEPENDENT_AMBULATORY_CARE_PROVIDER_SITE_OTHER): Payer: Medicare PPO

## 2020-12-09 DIAGNOSIS — Z23 Encounter for immunization: Secondary | ICD-10-CM | POA: Diagnosis not present

## 2020-12-29 ENCOUNTER — Other Ambulatory Visit: Payer: Self-pay | Admitting: Urology

## 2020-12-30 ENCOUNTER — Encounter: Payer: Self-pay | Admitting: Gastroenterology

## 2020-12-30 ENCOUNTER — Ambulatory Visit: Payer: Medicare PPO | Admitting: Gastroenterology

## 2020-12-30 VITALS — BP 136/68 | HR 76 | Ht 67.5 in | Wt 194.6 lb

## 2020-12-30 DIAGNOSIS — R1314 Dysphagia, pharyngoesophageal phase: Secondary | ICD-10-CM | POA: Diagnosis not present

## 2020-12-30 NOTE — Progress Notes (Signed)
[facility] Consult Note:  History: Ernest Hughes. 12/30/2020  Referring provider: Denita Lung, MD  Reason for consult/chief complaint: Dysphagia (Difficulties with food getting stuck, usually with meats. He admitts to drinking a lot of water so he tries to make sure he chews properly and drinks to help make sure the food goes down. He states that it has been going on for around 7 mouths. He states that he has a past history of reflux but is no longer on anything due to no longer having any issues, but notices that belching has helped with the swallowing problems. )   Subjective  HPI:  This is a very pleasant man referred by primary care for dysphagia.  For about the last 7 months he has noticed a feeling of food "slow to pass" through the throat.  It does not feel stuck in the chest and he has not bring food back up.  He denies pyrosis, cough, hemoptysis nausea vomiting or weight loss.  He denies chronic sinus congestion postnasal drip.  Ernest Hughes had mentioned primary care that he seems to get belching more frequently than before.  He has regular bowel movements with daily fiber supplement and denies rectal bleeding.  EGD by Dr. Sharlett Hughes July 2010 for heme positive stool.  Hiatal hernia noted, otherwise normal.  No polyps on July 2010 colonoscopy.  Negative Cologuard October 2016 and December 2019.  ROS:  Review of Systems  Constitutional: Negative for appetite change and unexpected weight change.  HENT: Negative for mouth sores and voice change.   Eyes: Negative for pain and redness.  Respiratory: Negative for cough and shortness of breath.   Cardiovascular: Negative for chest pain and palpitations.  Genitourinary: Negative for dysuria and hematuria.  Musculoskeletal: Positive for arthralgias. Negative for myalgias.  Skin: Negative for pallor and rash.  Neurological: Negative for weakness and headaches.  Hematological: Negative for adenopathy.      Past Medical History: Past Medical History:  Diagnosis Date  . BPH (benign prostatic hyperplasia)   . Cancer ([place])    PROSTATE--treated with radiation  . Diverticulosis   . GERD (gastroesophageal reflux disease)   . Hypertension      Past Surgical History: Past Surgical History:  Procedure Laterality Date  . BELPHAROPTOSIS REPAIR  12/2013   bilateral  . cataract surgery Left 2019  . COLONOSCOPY     Dr. Sharlett Hughes     Family History: Family History  Problem Relation Age of Onset  . Cancer Mother   . Cancer Father   . Heart disease Sister   . Cancer Maternal Aunt   . Cancer Paternal Aunt   . Cancer Paternal Grandfather   . Colon polyps Neg Hx   . Colon cancer Neg Hx     Social History: Social History   Socioeconomic History  . Marital status: Married    Spouse name: Not on file  . Number of children: Not on file  . Years of education: Not on file  . Highest education level: Not on file  Occupational History  . Not on file  Tobacco Use  . Smoking status: Never Smoker  . Smokeless tobacco: Never Used  Vaping Use  . Vaping Use: Never used  Substance and Sexual Activity  . Alcohol use: Yes    Comment: has some wine once in a while   . Drug use: No  . Sexual activity: Not Currently  Other Topics Concern  . Not on file  Social History  Narrative  . Not on file   Social Determinants of Health   Financial Resource Strain: Not on file  Food Insecurity: Not on file  Transportation Needs: Not on file  Physical Activity: Not on file  Stress: Not on file  Social Connections: Not on file    Allergies: No Known Allergies  Outpatient Meds: Current Outpatient Medications  Medication Sig Dispense Refill  . acetaminophen (TYLENOL) 500 MG tablet Take 500 mg by mouth every 6 (six) hours as needed.    Marland Kitchen alum & mag hydroxide-simeth (MAALOX PLUS) 400-400-40 MG/5ML suspension Take 5 mLs by mouth every 6 (six) hours as needed for indigestion.    Marland Kitchen  amLODipine-olmesartan (AZOR) 5-20 MG tablet Take 1 tablet by mouth daily. 90 tablet 3  . atorvastatin (LIPITOR) 10 MG tablet TAKE ONE TABLET BY MOUTH ONE TIME DAILY 90 tablet 0  . carboxymethylcellulose (REFRESH PLUS) 0.5 % SOLN 1 drop 3 (three) times daily as needed.    . clindamycin (CLEOCIN T) 1 % external solution Apply topically 2 (two) times daily.    . diclofenac sodium (VOLTAREN) 1 % GEL Apply topically 4 (four) times daily.    Marland Kitchen erythromycin ophthalmic ointment erythromycin 5 mg/gram (0.5 %) eye ointment    . fluocinolone (VANOS) 0.01 % cream fluocinolone 0.01 % topical cream  APPLY A THIN LAYER TO THE AFFECTED AREA(S) BY TOPICAL ROUTE 2-4 TIMESDAILY    . fluocinonide cream (LIDEX) 4.08 % Apply 1 application topically 2 (two) times daily.    . fluticasone (CUTIVATE) 0.05 % cream     . Fluticasone Propionate 0.05 % LOTN Apply topically.    . Ginkgo Biloba 40 MG TABS Take 120 mg by mouth daily.    . Ibuprofen (ADVIL PO) Take by mouth.    . naproxen sodium (ALEVE) 220 MG tablet Take 220 mg by mouth.    Marland Kitchen omeprazole (PRILOSEC) 40 MG capsule TAKE 1 CAPSULE DAILY 90 capsule 3  . psyllium (METAMUCIL) 58.6 % powder Take 1 packet by mouth as needed.    . tadalafil (CIALIS) 5 MG tablet TAKE ONE TABLET BY MOUTH ONE TIME DAILY 90 tablet 0  . triamcinolone cream (KENALOG) 0.1 % Apply 1 application topically 2 (two) times daily. 30 g 0  . tamsulosin (FLOMAX) 0.4 MG CAPS capsule TAKE ONE CAPSULE BY MOUTH TWICE DAILY 60 capsule 0   No current facility-administered medications for this visit.      ___________________________________________________________________ Objective   Exam:  BP 136/68   Pulse 76   Ht 5' 7.5" (1.715 m)   Wt 194 lb 9.6 oz (88.3 kg)   BMI 30.03 kg/m  Wt Readings from Last 3 Encounters:  12/30/20 194 lb 9.6 oz (88.3 kg)  11/18/20 194 lb 6.4 oz (88.2 kg)  05/18/20 186 lb (84.4 kg)     General: Well-appearing, normal vocal quality  Eyes: sclera anicteric, no  redness  ENT: oral mucosa moist without lesions, no cervical or supraclavicular lymphadenopathy  CV: RRR without murmur, S1/S2, no JVD, no peripheral edema  Resp: clear to auscultation bilaterally, normal RR and effort noted  GI: soft, no tenderness, with active bowel sounds. No guarding or palpable organomegaly noted.  Skin; warm and dry, no rash or jaundice noted  Neuro: awake, alert and oriented x 3. Normal gross motor function and fluent speech    Assessment: Encounter Diagnosis  Name Primary?  . Pharyngoesophageal dysphagia Yes    7 months of dysphagia, somewhat difficult to characterize.  Small hiatal hernia diagnosed many  years ago.  Possible enlarged hiatal hernia/ring with sensation of dysphagia felt in the neck rather than chest.  Possible mild cricopharyngeal dysfunction. No symptoms to suggest reflux Plan:  Upper endoscopy with possible dilation.  He was agreeable after discussion of procedure and risks.  The benefits and risks of the planned procedure were described in detail with the patient or (when appropriate) their health care proxy.  Risks were outlined as including, but not limited to, bleeding, infection, perforation, adverse medication reaction leading to cardiac or pulmonary decompensation, pancreatitis (if ERCP).  The limitation of incomplete mucosal visualization was also discussed.  No guarantees or warranties were given.  He will be due for colon cancer screening at the end of this year, and thinks he might do a colonoscopy at that time instead of a Cologuard.  Thank you for the courtesy of this consult.  Please call me with any questions or concerns.  Nelida Meuse III  CC: Referring provider noted above

## 2020-12-30 NOTE — Patient Instructions (Signed)
If you are age 75 or older, your body mass index should be between 23-30. Your Body mass index is 30.03 kg/m. If this is out of the aforementioned range listed, please consider follow up with your Primary Care Provider.  If you are age 15 or younger, your body mass index should be between 19-25. Your Body mass index is 30.03 kg/m. If this is out of the aformentioned range listed, please consider follow up with your Primary Care Provider.   You have been scheduled for an endoscopy. Please follow written instructions given to you at your visit today. If you use inhalers (even only as needed), please bring them with you on the day of your procedure.  It was a pleasure to see you today!  Thank you for trusting me with your gastrointestinal care!

## 2021-01-19 ENCOUNTER — Ambulatory Visit (AMBULATORY_SURGERY_CENTER): Payer: Medicare PPO | Admitting: Gastroenterology

## 2021-01-19 ENCOUNTER — Other Ambulatory Visit: Payer: Self-pay

## 2021-01-19 ENCOUNTER — Encounter: Payer: Self-pay | Admitting: Gastroenterology

## 2021-01-19 VITALS — BP 131/82 | HR 62 | Temp 97.8°F | Resp 15 | Ht 67.5 in | Wt 194.0 lb

## 2021-01-19 DIAGNOSIS — R1310 Dysphagia, unspecified: Secondary | ICD-10-CM | POA: Diagnosis not present

## 2021-01-19 DIAGNOSIS — R1319 Other dysphagia: Secondary | ICD-10-CM | POA: Diagnosis not present

## 2021-01-19 DIAGNOSIS — K222 Esophageal obstruction: Secondary | ICD-10-CM | POA: Diagnosis not present

## 2021-01-19 DIAGNOSIS — R1314 Dysphagia, pharyngoesophageal phase: Secondary | ICD-10-CM

## 2021-01-19 MED ORDER — SODIUM CHLORIDE 0.9 % IV SOLN: 500.0000 mL | Freq: Once | INTRAVENOUS | Status: DC

## 2021-01-19 NOTE — Patient Instructions (Signed)
Please read handouts provided. Continue present medications. Resume previous diet. Cut and chew food well - especially meat, and eat slowly with plenty of liquids. Return to GI clinic as needed.    YOU HAD AN ENDOSCOPIC PROCEDURE TODAY AT [facility]:   Refer to the procedure report that was given to you for any specific questions about what was found during the examination.  If the procedure report does not answer your questions, please call your gastroenterologist to clarify.  If you requested that your care partner not be given the details of your procedure findings, then the procedure report has been included in a sealed envelope for you to review at your convenience later.  YOU SHOULD EXPECT: Some feelings of bloating in the abdomen. Passage of more gas than usual.  Walking can help get rid of the air that was put into your GI tract during the procedure and reduce the bloating. If you had a lower endoscopy (such as a colonoscopy or flexible sigmoidoscopy) you may notice spotting of blood in your stool or on the toilet paper. If you underwent a bowel prep for your procedure, you may not have a normal bowel movement for a few days.  Please Note:  You might notice some irritation and congestion in your nose or some drainage.  This is from the oxygen used during your procedure.  There is no need for concern and it should clear up in a day or so.  SYMPTOMS TO REPORT IMMEDIATELY:    Following upper endoscopy (EGD)  Vomiting of blood or coffee ground material  New chest pain or pain under the shoulder blades  Painful or persistently difficult swallowing  New shortness of breath  Fever of 100F or higher  Black, tarry-looking stools  For urgent or emergent issues, a gastroenterologist can be reached at any hour by calling [phone]. Do not use MyChart messaging for urgent concerns.    DIET:  We do recommend a small meal at first, but then you may proceed to your  regular diet.  Drink plenty of fluids but you should avoid alcoholic beverages for 24 hours.  ACTIVITY:  You should plan to take it easy for the rest of today and you should NOT DRIVE or use heavy machinery until tomorrow (because of the sedation medicines used during the test).    FOLLOW UP: Our staff will call the number listed on your records 48-72 hours following your procedure to check on you and address any questions or concerns that you may have regarding the information given to you following your procedure. If we do not reach you, we will leave a message.  We will attempt to reach you two times.  During this call, we will ask if you have developed any symptoms of COVID 19. If you develop any symptoms (ie: fever, flu-like symptoms, shortness of breath, cough etc.) before then, please call [phone].  If you test positive for Covid 19 in the 2 weeks post procedure, please call and report this information to [place].    If any biopsies were taken you will be contacted by phone or by letter within the next 1-3 weeks.  Please call us at [phone] if you have not heard about the biopsies in 3 weeks.    SIGNATURES/CONFIDENTIALITY: You and/or your care partner have signed paperwork which will be entered into your electronic medical record.  These signatures attest to the fact that that the information above on your After Visit Summary has  been reviewed and is understood.  Full responsibility of the confidentiality of this discharge information lies with you and/or your care-partner.

## 2021-01-19 NOTE — Progress Notes (Signed)
Called to room to assist during endoscopic procedure.  Patient ID and intended procedure confirmed with present staff. Received instructions for my participation in the procedure from the performing physician.  

## 2021-01-19 NOTE — Progress Notes (Signed)
VS- Ernest Hughes  Left eye reddened- pt states he used his eye drops this am

## 2021-01-19 NOTE — Op Note (Signed)
[place] Patient Name: Ernest Hughes Procedure Date: 01/19/2021 7:28 AM MRN: [id number] Endoscopist: Mallie Mussel L. Loletha Carrow , MD Age: 75 Referring MD:  Date of Birth: [birth year] Gender: Male Account #: [id number] Procedure:                Upper GI endoscopy Indications:              Esophageal dysphagia Medicines:                Monitored Anesthesia Care Procedure:                Pre-Anesthesia Assessment:                           - Prior to the procedure, a History and Physical                            was performed, and patient medications and                            allergies were reviewed. The patient's tolerance of                            previous anesthesia was also reviewed. The risks                            and benefits of the procedure and the sedation                            options and risks were discussed with the patient.                            All questions were answered, and informed consent                            was obtained. Prior Anticoagulants: The patient has                            taken no previous anticoagulant or antiplatelet                            agents. ASA Grade Assessment: II - A patient with                            mild systemic disease. After reviewing the risks                            and benefits, the patient was deemed in                            satisfactory condition to undergo the procedure.                           After obtaining informed consent, the endoscope was  passed under direct vision. Throughout the                            procedure, the patient's blood pressure, pulse, and                            oxygen saturations were monitored continuously. The                            Endoscope was introduced through the mouth, and                            advanced to the second part of duodenum. The upper                            GI endoscopy was accomplished without  difficulty.                            The patient tolerated the procedure well. Scope In: Scope Out: Findings:                 One benign-appearing, intrinsic moderate stenosis                            was found at the gastroesophageal junction. This                            stenosis measured less than one cm (in length). The                            stenosis was traversed. A TTS dilator was passed                            through the scope. Dilation with a 16-17-18 mm                            balloon dilator was performed to 18 mm. The                            dilation site was examined following endoscope                            reinsertion and showed mild mucosal disruption and                            mild improvement in luminal narrowing.                           The stomach was normal.                           The cardia and gastric fundus were normal on  retroflexion.                           The examined duodenum was normal. Complications:            No immediate complications. Estimated Blood Loss:     Estimated blood loss was minimal. Impression:               - Benign-appearing esophageal stenosis. Dilated.                           - Normal stomach.                           - Normal examined duodenum.                           - No specimens collected. Recommendation:           - Patient has a contact number available for                            emergencies. The signs and symptoms of potential                            delayed complications were discussed with the                            patient. Return to normal activities tomorrow.                            Written discharge instructions were provided to the                            patient.                           - Resume previous diet. Cut and chew food well -                            especially meat, and eat slowly with plenty of                             liquids.                           - Continue present medications.                           - Return to my office PRN. Holman Bonsignore L. Loletha Carrow, MD 01/19/2021 8:03:13 AM This report has been signed electronically.

## 2021-01-19 NOTE — Progress Notes (Signed)
A/ox3, pleased with MAC, report to RN 

## 2021-01-21 ENCOUNTER — Telehealth: Payer: Self-pay

## 2021-01-21 NOTE — Telephone Encounter (Signed)
No answer, left message to call back later today, B.Matty Vanroekel RN. 

## 2021-01-21 NOTE — Telephone Encounter (Signed)
  Follow up Call-  Call back number 01/19/2021  Post procedure Call Back phone  # [phone]  Permission to leave phone message Yes  Some recent data might be hidden     Patient questions:  Do you have a fever, pain , or abdominal swelling? No. Pain Score  0 *  Have you tolerated food without any problems? Yes.    Have you been able to return to your normal activities? Yes.    Do you have any questions about your discharge instructions: Diet   No. Medications  No. Follow up visit  No.  Do you have questions or concerns about your Care? No.  Actions: * If pain score is 4 or above: No action needed, pain <4.

## 2021-01-26 ENCOUNTER — Other Ambulatory Visit: Payer: Self-pay | Admitting: Urology

## 2021-02-02 DIAGNOSIS — H04123 Dry eye syndrome of bilateral lacrimal glands: Secondary | ICD-10-CM | POA: Diagnosis not present

## 2021-02-02 DIAGNOSIS — Z961 Presence of intraocular lens: Secondary | ICD-10-CM | POA: Diagnosis not present

## 2021-02-02 DIAGNOSIS — H43811 Vitreous degeneration, right eye: Secondary | ICD-10-CM | POA: Diagnosis not present

## 2021-02-02 DIAGNOSIS — H47323 Drusen of optic disc, bilateral: Secondary | ICD-10-CM | POA: Diagnosis not present

## 2021-02-02 DIAGNOSIS — H10413 Chronic giant papillary conjunctivitis, bilateral: Secondary | ICD-10-CM | POA: Diagnosis not present

## 2021-02-02 DIAGNOSIS — H26492 Other secondary cataract, left eye: Secondary | ICD-10-CM | POA: Diagnosis not present

## 2021-02-09 ENCOUNTER — Other Ambulatory Visit: Payer: Self-pay | Admitting: Family Medicine

## 2021-02-09 DIAGNOSIS — E7800 Pure hypercholesterolemia, unspecified: Secondary | ICD-10-CM

## 2021-02-25 ENCOUNTER — Other Ambulatory Visit: Payer: Self-pay | Admitting: Urology

## 2021-04-04 DIAGNOSIS — H10413 Chronic giant papillary conjunctivitis, bilateral: Secondary | ICD-10-CM | POA: Diagnosis not present

## 2021-04-04 DIAGNOSIS — Z961 Presence of intraocular lens: Secondary | ICD-10-CM | POA: Diagnosis not present

## 2021-04-04 DIAGNOSIS — H47323 Drusen of optic disc, bilateral: Secondary | ICD-10-CM | POA: Diagnosis not present

## 2021-04-04 DIAGNOSIS — H04123 Dry eye syndrome of bilateral lacrimal glands: Secondary | ICD-10-CM | POA: Diagnosis not present

## 2021-04-04 DIAGNOSIS — H43811 Vitreous degeneration, right eye: Secondary | ICD-10-CM | POA: Diagnosis not present

## 2021-04-04 DIAGNOSIS — H00025 Hordeolum internum left lower eyelid: Secondary | ICD-10-CM | POA: Diagnosis not present

## 2021-04-19 DIAGNOSIS — H43811 Vitreous degeneration, right eye: Secondary | ICD-10-CM | POA: Diagnosis not present

## 2021-04-19 DIAGNOSIS — H04123 Dry eye syndrome of bilateral lacrimal glands: Secondary | ICD-10-CM | POA: Diagnosis not present

## 2021-04-19 DIAGNOSIS — H10413 Chronic giant papillary conjunctivitis, bilateral: Secondary | ICD-10-CM | POA: Diagnosis not present

## 2021-04-19 DIAGNOSIS — H00025 Hordeolum internum left lower eyelid: Secondary | ICD-10-CM | POA: Diagnosis not present

## 2021-04-19 DIAGNOSIS — Z961 Presence of intraocular lens: Secondary | ICD-10-CM | POA: Diagnosis not present

## 2021-04-19 DIAGNOSIS — H47323 Drusen of optic disc, bilateral: Secondary | ICD-10-CM | POA: Diagnosis not present

## 2021-05-06 ENCOUNTER — Other Ambulatory Visit: Payer: Self-pay | Admitting: Family Medicine

## 2021-05-06 DIAGNOSIS — E7800 Pure hypercholesterolemia, unspecified: Secondary | ICD-10-CM

## 2021-05-09 DIAGNOSIS — H47323 Drusen of optic disc, bilateral: Secondary | ICD-10-CM | POA: Diagnosis not present

## 2021-05-09 DIAGNOSIS — H10413 Chronic giant papillary conjunctivitis, bilateral: Secondary | ICD-10-CM | POA: Diagnosis not present

## 2021-05-09 DIAGNOSIS — H04123 Dry eye syndrome of bilateral lacrimal glands: Secondary | ICD-10-CM | POA: Diagnosis not present

## 2021-05-09 DIAGNOSIS — Z961 Presence of intraocular lens: Secondary | ICD-10-CM | POA: Diagnosis not present

## 2021-05-09 DIAGNOSIS — H00024 Hordeolum internum left upper eyelid: Secondary | ICD-10-CM | POA: Diagnosis not present

## 2021-05-09 DIAGNOSIS — H43811 Vitreous degeneration, right eye: Secondary | ICD-10-CM | POA: Diagnosis not present

## 2021-05-16 ENCOUNTER — Encounter: Payer: Self-pay | Admitting: Family Medicine

## 2021-05-16 ENCOUNTER — Ambulatory Visit: Payer: Medicare PPO | Admitting: Family Medicine

## 2021-05-16 ENCOUNTER — Other Ambulatory Visit: Payer: Self-pay

## 2021-05-16 VITALS — BP 140/86 | HR 85 | Temp 96.7°F | Wt 195.6 lb

## 2021-05-16 DIAGNOSIS — H00014 Hordeolum externum left upper eyelid: Secondary | ICD-10-CM | POA: Diagnosis not present

## 2021-05-16 DIAGNOSIS — R1319 Other dysphagia: Secondary | ICD-10-CM

## 2021-05-16 NOTE — Progress Notes (Signed)
   Subjective:    Patient ID: Ernest Hughes., male    DOB: 04/17/1946, 75 y.o.   MRN: [id number]  HPI He complains of swelling and left-sided throat discomfort that started yesterday.  He states that the swelling was on the left side of his face.  He is concerned about the doxycycline that he is on from ophthalmology for his stye.  Apparently the swelling and the discomfort has diminished slightly.  He states that he did see something on the left side of his throat yesterday.   Review of Systems     Objective:   Physical Exam Alert and in no distress.  TMs and canals normal.  Throat shows no lesions.  Neck is supple without adenopathy.  Left upper eyelid does have some swelling as well as an area of tenderness medially.       Assessment & Plan:  Esophageal dysphagia  Hordeolum externum of left upper eyelid I explained that I could not identify any reason for the sore throat but did not find any red flags.  Recommend supportive care including pain management.  Continue on doxycycline.  Explained that the antibiotic is not causing any of the symptoms.  He will keep me informed and if anything changes come back for reevaluation.

## 2021-05-16 NOTE — Patient Instructions (Signed)
Take 4 Advil 3 times per day for the pain and call me if you continue to have trouble

## 2021-05-30 DIAGNOSIS — H10413 Chronic giant papillary conjunctivitis, bilateral: Secondary | ICD-10-CM | POA: Diagnosis not present

## 2021-05-30 DIAGNOSIS — H04123 Dry eye syndrome of bilateral lacrimal glands: Secondary | ICD-10-CM | POA: Diagnosis not present

## 2021-05-30 DIAGNOSIS — H43811 Vitreous degeneration, right eye: Secondary | ICD-10-CM | POA: Diagnosis not present

## 2021-05-30 DIAGNOSIS — Z961 Presence of intraocular lens: Secondary | ICD-10-CM | POA: Diagnosis not present

## 2021-05-30 DIAGNOSIS — H00024 Hordeolum internum left upper eyelid: Secondary | ICD-10-CM | POA: Diagnosis not present

## 2021-05-30 DIAGNOSIS — H47323 Drusen of optic disc, bilateral: Secondary | ICD-10-CM | POA: Diagnosis not present

## 2021-05-31 ENCOUNTER — Ambulatory Visit: Payer: Medicare PPO | Admitting: Family Medicine

## 2021-05-31 ENCOUNTER — Other Ambulatory Visit: Payer: Self-pay

## 2021-05-31 ENCOUNTER — Encounter: Payer: Self-pay | Admitting: Family Medicine

## 2021-05-31 VITALS — BP 140/86 | HR 62 | Temp 96.8°F | Wt 198.0 lb

## 2021-05-31 DIAGNOSIS — Z9889 Other specified postprocedural states: Secondary | ICD-10-CM

## 2021-05-31 DIAGNOSIS — E7439 Other disorders of intestinal carbohydrate absorption: Secondary | ICD-10-CM

## 2021-05-31 LAB — POCT GLYCOSYLATED HEMOGLOBIN (HGB A1C): Hemoglobin A1C: 6 % — AB (ref 4.0–5.6)

## 2021-05-31 NOTE — Progress Notes (Signed)
   Subjective:    Patient ID: Ernest Ora., male    DOB: [birth year], 75 y.o.   MRN: [id number]  HPI He is here for recheck.  In April his A1c did hit 6.5.  I had discussion with him at that time to make further diet and exercise changes and working on bringing those numbers down to not have to label him as diabetic.  Since that time he has had knee surgery which interfered with his diet and exercise regimen.  He does plan on getting back into that since he is now through with rehab.   Review of Systems     Objective:   Physical Exam  Alert and in no distress otherwise not examined Hemoglobin A1c is 6.0      Assessment & Plan:  Glucose intolerance - Plan: POCT glycosylated hemoglobin (Hb A1C)  History of arthroscopic knee surgery I explained that he is now but would consider glucose intolerance.  Encouraged him to make changes in diet and exercise cutting back on carbohydrates and doing 20 minutes of something physical on a daily basis.  Continue on his blood pressure and cholesterol medications.

## 2021-06-03 ENCOUNTER — Other Ambulatory Visit: Payer: Self-pay | Admitting: Family Medicine

## 2021-06-03 DIAGNOSIS — I10 Essential (primary) hypertension: Secondary | ICD-10-CM

## 2021-07-05 DIAGNOSIS — H47323 Drusen of optic disc, bilateral: Secondary | ICD-10-CM | POA: Diagnosis not present

## 2021-07-05 DIAGNOSIS — H00024 Hordeolum internum left upper eyelid: Secondary | ICD-10-CM | POA: Diagnosis not present

## 2021-07-05 DIAGNOSIS — H04123 Dry eye syndrome of bilateral lacrimal glands: Secondary | ICD-10-CM | POA: Diagnosis not present

## 2021-07-05 DIAGNOSIS — H43811 Vitreous degeneration, right eye: Secondary | ICD-10-CM | POA: Diagnosis not present

## 2021-07-05 DIAGNOSIS — H10413 Chronic giant papillary conjunctivitis, bilateral: Secondary | ICD-10-CM | POA: Diagnosis not present

## 2021-07-05 DIAGNOSIS — Z961 Presence of intraocular lens: Secondary | ICD-10-CM | POA: Diagnosis not present

## 2021-07-18 DIAGNOSIS — H10413 Chronic giant papillary conjunctivitis, bilateral: Secondary | ICD-10-CM | POA: Diagnosis not present

## 2021-07-18 DIAGNOSIS — H43811 Vitreous degeneration, right eye: Secondary | ICD-10-CM | POA: Diagnosis not present

## 2021-07-18 DIAGNOSIS — H47323 Drusen of optic disc, bilateral: Secondary | ICD-10-CM | POA: Diagnosis not present

## 2021-07-18 DIAGNOSIS — H04123 Dry eye syndrome of bilateral lacrimal glands: Secondary | ICD-10-CM | POA: Diagnosis not present

## 2021-07-18 DIAGNOSIS — H00024 Hordeolum internum left upper eyelid: Secondary | ICD-10-CM | POA: Diagnosis not present

## 2021-07-18 DIAGNOSIS — Z961 Presence of intraocular lens: Secondary | ICD-10-CM | POA: Diagnosis not present

## 2021-08-02 ENCOUNTER — Other Ambulatory Visit: Payer: Self-pay | Admitting: Family Medicine

## 2021-08-02 DIAGNOSIS — E7800 Pure hypercholesterolemia, unspecified: Secondary | ICD-10-CM

## 2021-08-10 DIAGNOSIS — H0014 Chalazion left upper eyelid: Secondary | ICD-10-CM | POA: Diagnosis not present

## 2021-08-29 ENCOUNTER — Other Ambulatory Visit: Payer: Self-pay

## 2021-08-29 ENCOUNTER — Ambulatory Visit: Payer: Medicare PPO | Admitting: Family Medicine

## 2021-08-29 ENCOUNTER — Encounter: Payer: Self-pay | Admitting: Family Medicine

## 2021-08-29 VITALS — BP 128/82 | HR 87 | Temp 98.0°F | Ht 68.0 in | Wt 196.6 lb

## 2021-08-29 DIAGNOSIS — Z0000 Encounter for general adult medical examination without abnormal findings: Secondary | ICD-10-CM

## 2021-08-29 DIAGNOSIS — K5730 Diverticulosis of large intestine without perforation or abscess without bleeding: Secondary | ICD-10-CM | POA: Diagnosis not present

## 2021-08-29 DIAGNOSIS — E7800 Pure hypercholesterolemia, unspecified: Secondary | ICD-10-CM

## 2021-08-29 DIAGNOSIS — K219 Gastro-esophageal reflux disease without esophagitis: Secondary | ICD-10-CM | POA: Diagnosis not present

## 2021-08-29 DIAGNOSIS — L309 Dermatitis, unspecified: Secondary | ICD-10-CM

## 2021-08-29 DIAGNOSIS — E7439 Other disorders of intestinal carbohydrate absorption: Secondary | ICD-10-CM

## 2021-08-29 DIAGNOSIS — M1990 Unspecified osteoarthritis, unspecified site: Secondary | ICD-10-CM | POA: Diagnosis not present

## 2021-08-29 DIAGNOSIS — R7301 Impaired fasting glucose: Secondary | ICD-10-CM

## 2021-08-29 DIAGNOSIS — N529 Male erectile dysfunction, unspecified: Secondary | ICD-10-CM

## 2021-08-29 DIAGNOSIS — Z8546 Personal history of malignant neoplasm of prostate: Secondary | ICD-10-CM

## 2021-08-29 DIAGNOSIS — I10 Essential (primary) hypertension: Secondary | ICD-10-CM

## 2021-08-29 DIAGNOSIS — N401 Enlarged prostate with lower urinary tract symptoms: Secondary | ICD-10-CM | POA: Diagnosis not present

## 2021-08-29 DIAGNOSIS — Z9889 Other specified postprocedural states: Secondary | ICD-10-CM

## 2021-08-29 DIAGNOSIS — Z1211 Encounter for screening for malignant neoplasm of colon: Secondary | ICD-10-CM

## 2021-08-29 DIAGNOSIS — B351 Tinea unguium: Secondary | ICD-10-CM

## 2021-08-29 DIAGNOSIS — R351 Nocturia: Secondary | ICD-10-CM

## 2021-08-29 LAB — POCT GLYCOSYLATED HEMOGLOBIN (HGB A1C): Hemoglobin A1C: 6 % — AB (ref 4.0–5.6)

## 2021-08-29 MED ORDER — ATORVASTATIN CALCIUM 10 MG PO TABS: 10.0000 mg | ORAL_TABLET | Freq: Every day | ORAL | 3 refills | Status: DC

## 2021-08-29 MED ORDER — TAMSULOSIN HCL 0.4 MG PO CAPS: 0.4000 mg | ORAL_CAPSULE | Freq: Two times a day (BID) | ORAL | 3 refills | Status: DC

## 2021-08-29 MED ORDER — AMLODIPINE-OLMESARTAN 5-20 MG PO TABS: 1.0000 | ORAL_TABLET | Freq: Every day | ORAL | 3 refills | Status: DC

## 2021-08-29 MED ORDER — TADALAFIL 5 MG PO TABS: 5.0000 mg | ORAL_TABLET | Freq: Every day | ORAL | 3 refills | Status: DC

## 2021-08-29 NOTE — Progress Notes (Signed)
00.00 Ernest Hughes. is a 76 y.o. male who presents for annual wellness visit,CPE and follow-up on chronic medical conditions.  He has had some difficulty with shoulder discomfort as well as cracking with his knees.  He has had previous knee surgery and was given PT program but has not kept up with that.  He continues on atorvastatin and is having no difficulty with that.  His blood pressure is under good control on his present medication regimen.  He does use Cialis on a daily basis to help with his BPH and does take extra dosing for sexual activity.  Flomax twice per day is helping with his BPH and urinary symptoms.  His GERD seems to be under good control.  He does have a previous history of diverticulosis.  He does follow-up with urology concerning his prostate cancer.  He does complain of difficulty with continued rash in the gluteal area that did not respond to OTC medications.   Immunizations and Health Maintenance Immunization History  Administered Date(s) Administered   Fluad Quad(high Dose 65+) 04/15/2019, 05/25/2020   Influenza Split 05/22/2011, 05/02/2012   Influenza Whole 08/05/2003, 06/11/2006   Influenza, High Dose Seasonal PF 05/19/2013, 05/05/2014, 04/20/2015, 06/16/2016, 04/24/2017   Influenza-Unspecified 06/14/2018, 04/28/2021   PFIZER Comirnaty(Gray Top)Covid-19 Tri-Sucrose Vaccine 12/09/2020   PFIZER(Purple Top)SARS-COV-2 Vaccination 09/03/2019, 09/24/2019, 08/10/2020   PPD Test 11/01/2009   Pfizer Covid-19 Vaccine Bivalent Booster 52yrs & up 05/05/2021   Pneumococcal Conjugate-13 01/29/2008   Pneumococcal Polysaccharide-23 05/05/2014   Td 06/14/2005   Tdap 05/14/2019   Zoster Recombinat (Shingrix) 06/06/2017, 11/07/2017   Zoster, Live 06/11/2006   Health Maintenance Due  Topic Date Due   Fecal DNA (Cologuard)  08/09/2021    Last colonoscopy: upper endo Dr. Loletha Carrow; cologuard 08/09/18 Last PSA: (0.21 ) 11/24/20 Dentist: Q six months Ophtho: Q six months   Exercise: walking 5 days a week  Other doctors caring for patient include: Dr. Loletha Carrow GI            Dr. Katy Fitch eye  Advanced Directives: Does Patient Have a Medical Advance Directive?: Yes Type of Advance Directive: Living will, Healthcare Power of Attorney Does patient want to make changes to medical advance directive?: No - Patient declined [place] in Chart?: Yes - validated most recent copy scanned in chart (See row information)  Depression screen:  See questionnaire below.     Depression screen [facility] 2/9 08/29/2021 05/16/2021 05/10/2020 04/28/2019 12/12/2017  Decreased Interest 0 0 0 0 0  Down, Depressed, Hopeless 0 0 1 0 0  PHQ - 2 Score 0 0 1 0 0  Altered sleeping - - - - -  Tired, decreased energy - - - - -  PHQ-9 Score - - - - -    Fall Screen: See Questionaire below.   Fall Risk  08/29/2021 05/16/2021 05/10/2020 04/28/2019 06/16/2016  Falls in the past year? 0 0 0 0 No  Number falls in past yr: 0 0 - - -  Injury with Fall? 0 0 - - -  Risk for fall due to : No Fall Risks No Fall Risks - - -  Follow up Falls evaluation completed Falls evaluation completed - - -    ADL screen:  See questionnaire below.  Functional Status Survey: Is the patient deaf or have difficulty hearing?: No Does the patient have difficulty seeing, even when wearing glasses/contacts?: No Does the patient have difficulty concentrating, remembering, or making decisions?: No Does the patient have  difficulty walking or climbing stairs?: No Does the patient have difficulty dressing or bathing?: No Does the patient have difficulty doing errands alone such as visiting a doctor's office or shopping?: No   Review of Systems  Constitutional: -, -unexpected weight change, -anorexia, -fatigue Allergy: -sneezing, -itching, -congestion Dermatology: denies changing moles, rash, lumps ENT: -runny nose, -ear pain, -sore throat,  Cardiology:  -chest pain, -palpitations,  -orthopnea, Respiratory: -cough, -shortness of breath, -dyspnea on exertion, -wheezing,  Gastroenterology: -abdominal pain, -nausea, -vomiting, -diarrhea, -constipation, -dysphagia Hematology: -bleeding or bruising problems Musculoskeletal: -arthralgias, -myalgias, -joint swelling, -back pain, - Ophthalmology: -vision changes,  Urology: -dysuria, -difficulty urinating,  -urinary frequency, -urgency, incontinence Neurology: -, -numbness, , -memory loss, -falls, -dizziness    PHYSICAL EXAM: General Appearance: Alert, cooperative, no distress, appears stated age Head: Normocephalic, without obvious abnormality, atraumatic Eyes: PERRL, conjunctiva/corneas clear, EOM's intact,  Ears: Normal TM's and external ear canals Nose: Nares normal, mucosa normal, no drainage or sinus   tenderness Throat: Lips, mucosa, and tongue normal; teeth and gums normal Neck: Supple, no lymphadenopathy, thyroid:no enlargement/tenderness/nodules; no carotid bruit or JVD Lungs: Clear to auscultation bilaterally without wheezes, rales or ronchi; respirations unlabored Heart: Regular rate and rhythm, S1 and S2 normal, no murmur, rub or gallop Abdomen: Soft, non-tender, nondistended, normoactive bowel sounds, no masses, no hepatosplenomegaly Extremities: No clubbing, cyanosis or edema Pulses: 2+ and symmetric all extremities Skin: Skin color, texture, turgor normal, except in the gluteal area which does show some slight pigment changes. Lymph nodes: Cervical, supraclavicular, and axillary nodes normal Neurologic: CNII-XII intact, normal strength, sensation and gait; reflexes 2+ and symmetric throughout   Psych: Normal mood, affect, hygiene and grooming Hemoglobin A1c is 6.0 ASSESSMENT/PLAN: Routine general medical examination at a health care facility - Plan: CBC with Differential/Platelet, Comprehensive metabolic panel, Lipid panel  Essential hypertension - Plan: CBC with Differential/Platelet, Comprehensive  metabolic panel, amLODipine-olmesartan (AZOR) 5-20 MG tablet  Diverticulosis of colon  Gastroesophageal reflux disease without esophagitis  Arthritis  Erectile dysfunction, unspecified erectile dysfunction type  History of prostate cancer  HYPERCHOLESTEROLEMIA - Plan: Lipid panel, atorvastatin (LIPITOR) 10 MG tablet  Impaired fasting glucose - Plan: POCT glycosylated hemoglobin (Hb A1C)  Benign prostatic hyperplasia with nocturia - Plan: tadalafil (CIALIS) 5 MG tablet, tamsulosin (FLOMAX) 0.4 MG CAPS capsule  Dermatitis - Plan: Ambulatory referral to Dermatology  Screening for colon cancer - Plan: Cologuard  History of arthroscopic knee surgery Recommend range of motion exercises for the shoulder and the knee.  Demonstrated proper technique for both shoulder and knee especially with the knee in terminal extension. Discussed diet and exercise with him especially in regard to cutting back on carbohydrates.  He will follow-up with orthopedics    recommended at least 30 minutes of aerobic activity at least 5 days/week;  Immunization recommendations discussed.  Colonoscopy recommendations reviewed.   Medicare Attestation I have personally reviewed: The patient's medical and social history Their use of alcohol, tobacco or illicit drugs Their current medications and supplements The patient's functional ability including ADLs,fall risks, home safety risks, cognitive, and hearing and visual impairment Diet and physical activities Evidence for depression or mood disorders  The patient's weight, height, and BMI have been recorded in the chart.  I have made referrals, counseling, and provided education to the patient based on review of the above and I have provided the patient with a written personalized care plan for preventive services.     Jill Alexanders, MD   08/29/2021

## 2021-08-30 LAB — CBC WITH DIFFERENTIAL/PLATELET
Basophils Absolute: 0 10*3/uL (ref 0.0–0.2)
Basos: 1 %
EOS (ABSOLUTE): 0.1 10*3/uL (ref 0.0–0.4)
Eos: 1 %
Hematocrit: 42.5 % (ref 37.5–51.0)
Hemoglobin: 14.4 g/dL (ref 13.0–17.7)
Immature Grans (Abs): 0 10*3/uL (ref 0.0–0.1)
Immature Granulocytes: 0 %
Lymphocytes Absolute: 2.2 10*3/uL (ref 0.7–3.1)
Lymphs: 45 %
MCH: 29.1 pg (ref 26.6–33.0)
MCHC: 33.9 g/dL (ref 31.5–35.7)
MCV: 86 fL (ref 79–97)
Monocytes Absolute: 0.4 10*3/uL (ref 0.1–0.9)
Monocytes: 9 %
Neutrophils Absolute: 2.1 10*3/uL (ref 1.4–7.0)
Neutrophils: 44 %
Platelets: 179 10*3/uL (ref 150–450)
RBC: 4.95 x10E6/uL (ref 4.14–5.80)
RDW: 13.7 % (ref 11.6–15.4)
WBC: 4.7 10*3/uL (ref 3.4–10.8)

## 2021-08-30 LAB — COMPREHENSIVE METABOLIC PANEL
ALT: 14 IU/L (ref 0–44)
AST: 17 IU/L (ref 0–40)
Albumin/Globulin Ratio: 1.3 (ref 1.2–2.2)
Albumin: 4.2 g/dL (ref 3.7–4.7)
Alkaline Phosphatase: 77 IU/L (ref 44–121)
BUN/Creatinine Ratio: 15 (ref 10–24)
BUN: 16 mg/dL (ref 8–27)
Bilirubin Total: 0.3 mg/dL (ref 0.0–1.2)
CO2: 27 mmol/L (ref 20–29)
Calcium: 9.1 mg/dL (ref 8.6–10.2)
Chloride: 106 mmol/L (ref 96–106)
Creatinine, Ser: 1.05 mg/dL (ref 0.76–1.27)
Globulin, Total: 3.2 g/dL (ref 1.5–4.5)
Glucose: 100 mg/dL — ABNORMAL HIGH (ref 70–99)
Potassium: 4.7 mmol/L (ref 3.5–5.2)
Sodium: 144 mmol/L (ref 134–144)
Total Protein: 7.4 g/dL (ref 6.0–8.5)
eGFR: 74 mL/min/{1.73_m2} (ref >59)

## 2021-08-30 LAB — LIPID PANEL
Chol/HDL Ratio: 2.7 ratio (ref 0.0–5.0)
Cholesterol, Total: 144 mg/dL (ref 100–199)
HDL: 53 mg/dL (ref >39)
LDL Chol Calc (NIH): 77 mg/dL (ref 0–99)
Triglycerides: 68 mg/dL (ref 0–149)
VLDL Cholesterol Cal: 14 mg/dL (ref 5–40)

## 2021-09-12 DIAGNOSIS — Z1211 Encounter for screening for malignant neoplasm of colon: Secondary | ICD-10-CM | POA: Diagnosis not present

## 2021-09-14 ENCOUNTER — Other Ambulatory Visit: Payer: Self-pay

## 2021-09-14 ENCOUNTER — Encounter: Payer: Self-pay | Admitting: Family Medicine

## 2021-09-14 ENCOUNTER — Telehealth: Payer: Medicare PPO | Admitting: Family Medicine

## 2021-09-14 VITALS — Temp 97.1°F | Wt 195.0 lb

## 2021-09-14 DIAGNOSIS — U071 COVID-19: Secondary | ICD-10-CM

## 2021-09-14 NOTE — Progress Notes (Signed)
° °  Subjective:    Patient ID: Renie Ora., male    DOB: 05/18/1946, 76 y.o.   MRN: [id number]  HPI Documentation for virtual audio and video telecommunications through [place] encounter:  The patient was located at home. 2 patient identifiers used.  The provider was located in the office. The patient did consent to this visit and is aware of possible charges through their insurance for this visit. The other persons participating in this telemedicine service were none. Time spent on call was 5 minutes and in review of previous records >18 minutes total for counseling and coordination of care. This virtual service is not related to other E/M service within previous 7 days.  On Sunday he started having difficulty with rhinorrhea and did test himself for COVID which was negative.  He continues with rhinorrhea, slight chills, headache and today a slight sore throat with myalgias.  Test today was positive.  His wife tested positive yesterday.  He has had 4 vaccines.  Presently no fever, chills, earache, shortness of breath.  Review of Systems     Objective:   Physical Exam Alert and in no distress with normal breathing pattern.       Assessment & Plan:  COVID-19 I explained that he is halfway home from the 5-day window and really not having much in with symptoms of than slight sore throat, aching all over.  Discussed worsening of symptoms in regard to worsening cough, fever and shortness of breath.  Recommend over-the-counter medications like Tylenol/NSAID of choice and cough med as needed.  After the 5 days, he should wear a mask for another 5 days.  He is comfortable with all this.

## 2021-09-19 LAB — COLOGUARD: COLOGUARD: NEGATIVE

## 2021-09-20 DIAGNOSIS — H1131 Conjunctival hemorrhage, right eye: Secondary | ICD-10-CM | POA: Diagnosis not present

## 2021-10-31 DIAGNOSIS — H47323 Drusen of optic disc, bilateral: Secondary | ICD-10-CM | POA: Diagnosis not present

## 2021-10-31 DIAGNOSIS — H1132 Conjunctival hemorrhage, left eye: Secondary | ICD-10-CM | POA: Diagnosis not present

## 2021-10-31 DIAGNOSIS — H0102B Squamous blepharitis left eye, upper and lower eyelids: Secondary | ICD-10-CM | POA: Diagnosis not present

## 2021-10-31 DIAGNOSIS — H10413 Chronic giant papillary conjunctivitis, bilateral: Secondary | ICD-10-CM | POA: Diagnosis not present

## 2021-10-31 DIAGNOSIS — H43811 Vitreous degeneration, right eye: Secondary | ICD-10-CM | POA: Diagnosis not present

## 2021-10-31 DIAGNOSIS — H04123 Dry eye syndrome of bilateral lacrimal glands: Secondary | ICD-10-CM | POA: Diagnosis not present

## 2021-10-31 DIAGNOSIS — H0102A Squamous blepharitis right eye, upper and lower eyelids: Secondary | ICD-10-CM | POA: Diagnosis not present

## 2021-10-31 DIAGNOSIS — Z961 Presence of intraocular lens: Secondary | ICD-10-CM | POA: Diagnosis not present

## 2021-11-07 DIAGNOSIS — H43811 Vitreous degeneration, right eye: Secondary | ICD-10-CM | POA: Diagnosis not present

## 2021-11-07 DIAGNOSIS — H1133 Conjunctival hemorrhage, bilateral: Secondary | ICD-10-CM | POA: Diagnosis not present

## 2021-11-07 DIAGNOSIS — H0102A Squamous blepharitis right eye, upper and lower eyelids: Secondary | ICD-10-CM | POA: Diagnosis not present

## 2021-11-07 DIAGNOSIS — H0102B Squamous blepharitis left eye, upper and lower eyelids: Secondary | ICD-10-CM | POA: Diagnosis not present

## 2021-11-07 DIAGNOSIS — H04123 Dry eye syndrome of bilateral lacrimal glands: Secondary | ICD-10-CM | POA: Diagnosis not present

## 2021-11-07 DIAGNOSIS — H47323 Drusen of optic disc, bilateral: Secondary | ICD-10-CM | POA: Diagnosis not present

## 2021-11-07 DIAGNOSIS — H10413 Chronic giant papillary conjunctivitis, bilateral: Secondary | ICD-10-CM | POA: Diagnosis not present

## 2021-11-08 DIAGNOSIS — C61 Malignant neoplasm of prostate: Secondary | ICD-10-CM | POA: Diagnosis not present

## 2021-11-08 LAB — PSA: PSA: 0.23

## 2021-11-16 DIAGNOSIS — C61 Malignant neoplasm of prostate: Secondary | ICD-10-CM | POA: Diagnosis not present

## 2021-11-16 DIAGNOSIS — N401 Enlarged prostate with lower urinary tract symptoms: Secondary | ICD-10-CM | POA: Diagnosis not present

## 2021-11-16 DIAGNOSIS — R351 Nocturia: Secondary | ICD-10-CM | POA: Diagnosis not present

## 2022-01-17 ENCOUNTER — Encounter: Payer: Self-pay | Admitting: Family Medicine

## 2022-01-17 ENCOUNTER — Ambulatory Visit: Payer: Medicare PPO | Admitting: Family Medicine

## 2022-01-17 VITALS — BP 130/80 | HR 74 | Temp 97.2°F | Wt 199.6 lb

## 2022-01-17 DIAGNOSIS — M545 Low back pain, unspecified: Secondary | ICD-10-CM | POA: Diagnosis not present

## 2022-01-17 MED ORDER — CARISOPRODOL 350 MG PO TABS: 350.0000 mg | ORAL_TABLET | Freq: Four times a day (QID) | ORAL | 0 refills | Status: DC | PRN

## 2022-01-17 NOTE — Patient Instructions (Signed)
Heat to your back for 20 minutes 3 times per day.  Gentle stretching after that.  You can take 800 mg of ibuprofen 3 times per day alcohol: Muscle relaxer mainly at night   You can take it during the day but definitely at night.  Use the muscle relaxer as needed but be careful during the day because it can sedate you

## 2022-01-17 NOTE — Progress Notes (Signed)
   Subjective:    Patient ID: Ernest Ora., male    DOB: Mar 12, 1946, 76 y.o.   MRN: [id number]  HPI He has a previous history of difficulty with back pain especially if he drives for any period of time.  He then has to get out and stretch.  He recently took a 3-hour trip, helped mow the lawn and then drove back.  Since then he has continued to have mid low back pain but no numbness, tingling weakness or radiation of the pain.   Review of Systems     Objective:   Physical Exam Exam of his low back shows some splinting with loss of lumbar curve.  No palpable tenderness.  DTRs normal.  Negative straight leg raising.  Normal sensation.       Assessment & Plan:  Acute low back pain without sciatica, unspecified back pain laterality - Plan: carisoprodol (SOMA) 350 MG tablet Heat to your back for 20 minutes 3 times per day.  Gentle stretching after that.  You can take 800 mg of ibuprofen 3 times per day alcohol: Muscle relaxer mainly at night   You can take it during the day but definitely at night.  Use the muscle relaxer as needed but be careful during the day because it can sedate you

## 2022-02-27 ENCOUNTER — Ambulatory Visit: Payer: Medicare PPO | Admitting: Family Medicine

## 2022-02-27 VITALS — BP 138/78 | HR 62 | Temp 96.8°F | Wt 195.8 lb

## 2022-02-27 DIAGNOSIS — N401 Enlarged prostate with lower urinary tract symptoms: Secondary | ICD-10-CM

## 2022-02-27 DIAGNOSIS — I10 Essential (primary) hypertension: Secondary | ICD-10-CM | POA: Diagnosis not present

## 2022-02-27 DIAGNOSIS — L723 Sebaceous cyst: Secondary | ICD-10-CM | POA: Diagnosis not present

## 2022-02-27 DIAGNOSIS — R351 Nocturia: Secondary | ICD-10-CM | POA: Diagnosis not present

## 2022-02-27 DIAGNOSIS — Z9849 Cataract extraction status, unspecified eye: Secondary | ICD-10-CM | POA: Diagnosis not present

## 2022-02-27 DIAGNOSIS — Z8546 Personal history of malignant neoplasm of prostate: Secondary | ICD-10-CM | POA: Diagnosis not present

## 2022-02-27 DIAGNOSIS — R7301 Impaired fasting glucose: Secondary | ICD-10-CM

## 2022-02-27 DIAGNOSIS — M1990 Unspecified osteoarthritis, unspecified site: Secondary | ICD-10-CM

## 2022-02-27 NOTE — Progress Notes (Signed)
   Subjective:    Patient ID: Ernest Hughes., male    DOB: 1946-04-06, 76 y.o.   MRN: [id number]  HPI He is here for an interval evaluation.  He has had some recent eye surgery and has been given some drops for that.  He is having some difficulty with arthritis but is only taking Tylenol once per day.  He also has a lesion in the gluteal area that continues to give him intermittent trouble.  He has had bilateral cataract surgery.  Continues on his blood pressure medication as well as atorvastatin.  Flomax and Cialis are helping with his BPH.  He does have a previous history of prostate cancer.   Review of Systems     Objective:   Physical Exam Alert and in no distress. Tympanic membranes and canals are normal. Pharyngeal area is normal. Neck is supple without adenopathy or thyromegaly. Cardiac exam shows a regular sinus rhythm without murmurs or gallops. Lungs are clear to auscultation.        Assessment & Plan:  Benign prostatic hyperplasia with nocturia  Impaired fasting glucose  Arthritis  History of prostate cancer  Essential hypertension  Sebaceous cyst  History of cataract surgery, unspecified laterality He will continue on his present medication regimen but take more Tylenol to get relief of his symptoms since he is getting minimal relief with daily dosing.  Recommend he return here in the future for excision of the sebaceous cyst since it is giving him some difficulty.  He will be rechecked here in about 6 months.

## 2022-04-19 ENCOUNTER — Encounter: Payer: Self-pay | Admitting: Internal Medicine

## 2022-04-25 DIAGNOSIS — H10413 Chronic giant papillary conjunctivitis, bilateral: Secondary | ICD-10-CM | POA: Diagnosis not present

## 2022-04-25 DIAGNOSIS — H47323 Drusen of optic disc, bilateral: Secondary | ICD-10-CM | POA: Diagnosis not present

## 2022-04-25 DIAGNOSIS — H43811 Vitreous degeneration, right eye: Secondary | ICD-10-CM | POA: Diagnosis not present

## 2022-04-25 DIAGNOSIS — H0102B Squamous blepharitis left eye, upper and lower eyelids: Secondary | ICD-10-CM | POA: Diagnosis not present

## 2022-04-25 DIAGNOSIS — H04123 Dry eye syndrome of bilateral lacrimal glands: Secondary | ICD-10-CM | POA: Diagnosis not present

## 2022-04-25 DIAGNOSIS — Z961 Presence of intraocular lens: Secondary | ICD-10-CM | POA: Diagnosis not present

## 2022-04-25 DIAGNOSIS — H1132 Conjunctival hemorrhage, left eye: Secondary | ICD-10-CM | POA: Diagnosis not present

## 2022-04-25 DIAGNOSIS — H0102A Squamous blepharitis right eye, upper and lower eyelids: Secondary | ICD-10-CM | POA: Diagnosis not present

## 2022-05-23 ENCOUNTER — Encounter: Payer: Self-pay | Admitting: Internal Medicine

## 2022-05-29 ENCOUNTER — Telehealth: Payer: Self-pay | Admitting: Family Medicine

## 2022-05-29 NOTE — Telephone Encounter (Signed)
Pt called and wants to know if she should receive the RSV vaccine. Please advise at [phone].

## 2022-06-05 ENCOUNTER — Encounter: Payer: Self-pay | Admitting: Internal Medicine

## 2022-07-12 DIAGNOSIS — H47323 Drusen of optic disc, bilateral: Secondary | ICD-10-CM | POA: Diagnosis not present

## 2022-07-12 DIAGNOSIS — H0102A Squamous blepharitis right eye, upper and lower eyelids: Secondary | ICD-10-CM | POA: Diagnosis not present

## 2022-07-12 DIAGNOSIS — H43811 Vitreous degeneration, right eye: Secondary | ICD-10-CM | POA: Diagnosis not present

## 2022-07-12 DIAGNOSIS — H10413 Chronic giant papillary conjunctivitis, bilateral: Secondary | ICD-10-CM | POA: Diagnosis not present

## 2022-07-12 DIAGNOSIS — Z961 Presence of intraocular lens: Secondary | ICD-10-CM | POA: Diagnosis not present

## 2022-07-12 DIAGNOSIS — H0102B Squamous blepharitis left eye, upper and lower eyelids: Secondary | ICD-10-CM | POA: Diagnosis not present

## 2022-08-27 ENCOUNTER — Other Ambulatory Visit: Payer: Self-pay | Admitting: Family Medicine

## 2022-08-27 DIAGNOSIS — I10 Essential (primary) hypertension: Secondary | ICD-10-CM

## 2022-09-04 ENCOUNTER — Ambulatory Visit: Payer: Medicare PPO | Admitting: Family Medicine

## 2022-09-05 ENCOUNTER — Telehealth: Payer: Self-pay

## 2022-09-05 ENCOUNTER — Ambulatory Visit (INDEPENDENT_AMBULATORY_CARE_PROVIDER_SITE_OTHER): Payer: Medicare PPO

## 2022-09-05 VITALS — Ht 70.0 in | Wt 195.0 lb

## 2022-09-05 DIAGNOSIS — Z0000 Encounter for general adult medical examination without abnormal findings: Secondary | ICD-10-CM | POA: Diagnosis not present

## 2022-09-05 NOTE — Progress Notes (Signed)
Subjective:   Ernest Ines. is a 77 y.o. male who presents for Medicare Annual/Subsequent preventive examination.  I connected with  Renie Ora. on 09/05/22 by a audio enabled telemedicine application and verified that I am speaking with the correct person using two identifiers.  Patient Location: Home  Provider Location: Home Office  I discussed the limitations of evaluation and management by telemedicine. The patient expressed understanding and agreed to proceed.   Patient responses entered via [facility] on 09/04/2022. I verified no changes have occurred.   Review of Systems     Cardiac Risk Factors include: advanced age (>64mn, >>82women);sedentary lifestyle;hypertension;dyslipidemia;male gender     Objective:    Today's Vitals   09/05/22 1135 09/05/22 1136  Weight: 195 lb (88.5 kg)   Height: '5\' 10"'$  (1.778 m)   PainSc:  3    Body mass index is 27.98 kg/m.     09/05/2022   11:58 AM 08/29/2021    9:07 AM 05/10/2020   10:34 AM 12/05/2019    1:22 PM 04/28/2019    1:16 PM 12/12/2017   10:59 AM 06/16/2016   10:03 AM  Advanced Directives  Does Patient Have a Medical Advance Directive? Yes Yes Yes Yes No Yes Yes  Type of AParamed[place]of [place] will Living will;Healthcare Power of [place] will [place] will  [place] will   Does patient want to make changes to medical advance directive? No - Patient declined No - Patient declined    No - Patient declined   Copy of [place] Chart? Yes - validated most recent copy scanned in chart (See row information) Yes - validated most recent copy scanned in chart (See row information) Yes - validated most recent copy scanned in chart (See row information) Yes - validated most recent copy scanned in chart (See row information)   No - copy requested  Would patient like information on creating a  medical advance directive?     No - Patient declined      Current Medications (verified) Outpatient Encounter Medications as of 09/05/2022  Medication Sig   acetaminophen (TYLENOL) 650 MG CR tablet Take 650 mg by mouth every 8 (eight) hours as needed for pain.   alum & mag hydroxide-simeth (MAALOX PLUS) 400-400-40 MG/5ML suspension Take 5 mLs by mouth every 6 (six) hours as needed for indigestion.   amLODipine-olmesartan (AZOR) 5-20 MG tablet TAKE ONE TABLET BY MOUTH ONE TIME DAILY   atorvastatin (LIPITOR) 10 MG tablet Take 1 tablet (10 mg total) by mouth daily.   cetaphil (CETAPHIL) lotion Apply 1 Application topically as needed for dry skin.   diclofenac sodium (VOLTAREN) 1 % GEL Apply topically 4 (four) times daily.   fluocinolone (VANOS) 0.01 % cream    fluocinonide cream (LIDEX) 02.58% Apply 1 application  topically 2 (two) times daily.   Fluticasone Propionate 0.05 % LOTN Apply topically.   hydrocortisone cream 1 % APPLY A THIN LAYER TO THE AFFECTED AREA(S) BY TOPICAL ROUTE 2 TIMES PER DAY   Ibuprofen (ADVIL PO) Take by mouth.   ketoconazole (NIZORAL) 2 % cream    Polyvinyl Alcohol-Povidone (REFRESH OP)    Propylene Glycol (SYSTANE BALANCE OP) Apply to eye.   psyllium (METAMUCIL) 58.6 % powder Take 1 packet by mouth as needed.   tadalafil (CIALIS) 5 MG tablet Take 1 tablet (5 mg total) by mouth daily.   tamsulosin (FLOMAX)  0.4 MG CAPS capsule Take 1 capsule (0.4 mg total) by mouth 2 (two) times daily.   triamcinolone cream (KENALOG) 0.1 % Apply 1 application topically 2 (two) times daily.   clindamycin (CLEOCIN T) 1 % external solution Apply topically 2 (two) times daily. (Patient not taking: Reported on 09/05/2022)   erythromycin ophthalmic ointment  (Patient not taking: Reported on 09/05/2022)   Ginkgo Biloba 40 MG TABS Take 120 mg by mouth daily. (Patient not taking: Reported on 09/05/2022)   [DISCONTINUED] carisoprodol (SOMA) 350 MG tablet Take 1 tablet (350 mg total) by mouth 4  (four) times daily as needed for muscle spasms.   [DISCONTINUED] doxycycline (VIBRA-TABS) 100 MG tablet Take by mouth. (Patient not taking: Reported on 08/29/2021)   [DISCONTINUED] naproxen sodium (ALEVE) 220 MG tablet Take 220 mg by mouth. (Patient not taking: Reported on 08/29/2021)   [DISCONTINUED] omeprazole (PRILOSEC) 40 MG capsule TAKE 1 CAPSULE DAILY (Patient not taking: Reported on 08/29/2021)   [DISCONTINUED] Povidone, PF, (IVIZIA DRY EYES) 0.5 % SOLN Apply to eye.   [DISCONTINUED] RESTASIS 0.05 % ophthalmic emulsion    No facility-administered encounter medications on file as of 09/05/2022.    Allergies (verified) Patient has no known allergies.   History: Past Medical History:  Diagnosis Date   Arthritis    Asthma    as a baby   BPH (benign prostatic hyperplasia)    Cancer (HCC)    PROSTATE--treated with radiation   Diverticulosis    GERD (gastroesophageal reflux disease)    Hyperlipidemia    Hypertension    Past Surgical History:  Procedure Laterality Date   BELPHAROPTOSIS REPAIR  12/2013   bilateral   cataract surgery Left 2019   COLONOSCOPY     Dr. Sharlett Iles   Family History  Problem Relation Age of Onset   Cancer Mother    Cancer Father    Prostate cancer Father    Heart disease Sister    Cancer Maternal Aunt    Cancer Paternal Aunt    Cancer Paternal Grandfather    Prostate cancer Brother    Colon polyps Neg Hx    Colon cancer Neg Hx    Rectal cancer Neg Hx    Stomach cancer Neg Hx    Social History   Socioeconomic History   Marital status: Married    Spouse name: Blanch Media   Number of children: 2   Years of education: Not on file   Highest education level: Not on file  Occupational History   Not on file  Tobacco Use   Smoking status: Never   Smokeless tobacco: Never  Vaping Use   Vaping Use: Never used  Substance and Sexual Activity   Alcohol use: Yes    Comment: has some wine once in a while    Drug use: No   Sexual activity: Not Currently   Other Topics Concern   Not on file  Social History Narrative   One son lives with him at times, other son lives in [place] Determinants of Health   Financial Resource Strain: Low Risk  (09/05/2022)   Overall Financial Resource Strain (CARDIA)    Difficulty of Paying Living Expenses: Not very hard  Food Insecurity: No Food Insecurity (09/05/2022)   Hunger Vital Sign    Worried About Running Out of Food in the Last Year: Never true    [place] in the Last Year: Never true  Transportation Needs: No Transportation Needs (09/05/2022)   PRAPARE - Transportation  Lack of Transportation (Medical): No    Lack of Transportation (Non-Medical): No  Physical Activity: Insufficiently Active (09/05/2022)   Exercise Vital Sign    Days of Exercise per Week: 1 day    Minutes of Exercise per Session: 30 min  Stress: Stress Concern Present (09/05/2022)   [place]    Feeling of Stress : To some extent  Social Connections: Socially Integrated (09/05/2022)   Social Connection and Isolation Panel [NHANES]    Frequency of Communication with Friends and Family: More than three times a week    Frequency of Social Gatherings with Friends and Family: Once a week    Attends Religious Services: More than 4 times per year    Active Member of [employer] or Organizations: Yes    Attends Music therapist: More than 4 times per year    Marital Status: Married    Tobacco Counseling Counseling given: Not Answered   Clinical Intake:  Pre-visit preparation completed: Yes  Pain : 0-10 Pain Score: 3  Pain Type: Acute pain Pain Location: Eye Pain Orientation: Left Pain Descriptors / Indicators: Aching (had blepharotomy surgery recently) Pain Onset: 1 to 4 weeks ago Pain Frequency: Intermittent Pain Relieving Factors: tylenol  Pain Relieving Factors: tylenol  BMI - recorded: 27.98 Nutritional Status: BMI 25 -29  Overweight Nutritional Risks: None Diabetes: No  How often do you need to have someone help you when you read instructions, pamphlets, or other written materials from your doctor or pharmacy?: 1 - Never  Diabetic? no  Interpreter Needed?: No  Information entered by :: Mohit Zirbes, LPN   Activities of Daily Living    09/05/2022   11:59 AM 09/04/2022   11:38 PM  In your present state of health, do you have any difficulty performing the following activities:  Hearing? 0 0  Vision? 1 1  Comment will call ophthalmologist. has had blepharotomy and cataracts surgeries   Difficulty concentrating or making decisions? 0 0  Walking or climbing stairs? 1 1  Comment legs feel weaker and heavy - he will start exercising more to help   Dressing or bathing? 0 0  Doing errands, shopping? 0 0  Preparing Food and eating ? N N  Using the Toilet? N N  In the past six months, have you accidently leaked urine? Y Y  Comment hx prostate cancer   Do you have problems with loss of bowel control? N N  Managing your Medications? N N  Managing your Finances? N N  Housekeeping or managing your Housekeeping? N N    Patient Care Team: Denita Lung, MD as PCP - General (Family Medicine) Loletha Carrow, Kirke Corin, MD as Consulting Physician (Gastroenterology)  Indicate any recent Medical Services you may have received from other than [facility] providers in the past year (date may be approximate).     Assessment:   This is a routine wellness examination for Ernest Hughes.  Hearing/Vision screen Hearing Screening - Comments:: Denies hearing difficulties   Vision Screening - Comments:: Wears rx glasses, recent cataract and blepharitis surgeries - up to date with routine eye exams   Dietary issues and exercise activities discussed: Current Exercise Habits: Home exercise routine, Type of exercise: strength training/weights (gym once per month), Time (Minutes): 30, Frequency (Times/Week): 1, Weekly Exercise (Minutes/Week):  30, Intensity: Mild, Exercise limited by: orthopedic condition(s)   Goals Addressed             This Visit's Progress  Patient Stated       Wants to get more active. Stop worrying so much.       Depression Screen    09/05/2022   11:55 AM 08/29/2021    9:05 AM 05/16/2021    9:45 AM 05/10/2020   10:35 AM 04/28/2019   11:11 AM 12/12/2017   10:40 AM 06/16/2016    9:30 AM  PHQ 2/9 Scores  PHQ - 2 Score 0 0 0 1 0 0 0    Fall Risk    09/05/2022   11:44 AM 09/04/2022   11:38 PM 08/29/2021    9:07 AM 05/16/2021    9:46 AM 05/10/2020   10:34 AM  Fall Risk   Falls in the past year? 0 0 0 0 0  Number falls in past yr: 0 0 0 0   Injury with Fall? 0 0 0 0   Risk for fall due to : No Fall Risks  No Fall Risks No Fall Risks   Follow up Falls prevention discussed  Falls evaluation completed Falls evaluation completed     FALL RISK PREVENTION PERTAINING TO THE HOME:  Any stairs in or around the home? Yes  If so, are there any without handrails? No  Home free of loose throw rugs in walkways, pet beds, electrical cords, etc? Yes  Adequate lighting in your home to reduce risk of falls? Yes   ASSISTIVE DEVICES UTILIZED TO PREVENT FALLS:  Life alert? No  Use of a cane, walker or w/c? No  Grab bars in the bathroom? No  Shower chair or bench in shower? No  Elevated toilet seat or a handicapped toilet? No   TIMED UP AND GO:  Was the test performed? No . televisit  Cognitive Function:    04/28/2019   11:23 AM  MMSE - Mini Mental State Exam  Orientation to time 5  Orientation to Place 5  Registration 3  Attention/ Calculation 5  Recall 2  Language- name 2 objects 2  Language- repeat 1  Language- follow 3 step command 3  Language- read & follow direction 1  Write a sentence 1  Copy design 1  Total score 29        09/05/2022   12:02 PM  6CIT Screen  What Year? 0 points  What month? 0 points  What time? 0 points  Count back from 20 0 points  Months in reverse 0 points   Repeat phrase 2 points  Total Score 2 points    Immunizations Immunization History  Administered Date(s) Administered   Fluad Quad(high Dose 65+) 04/15/2019, 05/25/2020   Influenza Split 05/22/2011, 05/02/2012   Influenza Whole 08/05/2003, 06/11/2006   Influenza, High Dose Seasonal PF 05/19/2013, 05/05/2014, 04/20/2015, 06/16/2016, 04/24/2017   Influenza-Unspecified 06/14/2018, 04/28/2021, 02/23/2022   Moderna SARS-COV2 Booster Vaccination 05/04/2022   PFIZER Comirnaty(Gray Top)Covid-19 Tri-Sucrose Vaccine 12/09/2020   PFIZER(Purple Top)SARS-COV-2 Vaccination 09/03/2019, 09/24/2019, 08/10/2020   PPD Test 11/01/2009   Pfizer Covid-19 Vaccine Bivalent Booster 33yr & up 05/05/2021   Pneumococcal Conjugate-13 01/29/2008   Pneumococcal Polysaccharide-23 05/05/2014   Rsv, Bivalent, Protein Subunit Rsvpref,pf (Evans Lance 05/31/2022   Td 06/14/2005   Tdap 05/14/2019   Zoster Recombinat (Shingrix) 06/06/2017, 11/07/2017   Zoster, Live 06/11/2006    TDAP status: Up to date  Flu Vaccine status: Up to date  Pneumococcal vaccine status: Up to date  Covid-19 vaccine status: Completed vaccines  Qualifies for Shingles Vaccine? Yes   Zostavax completed Yes   Shingrix Completed?: Yes  Screening Tests  Health Maintenance  Topic Date Due   COVID-19 Vaccine (7 - 2023-24 season) 09/21/2022 (Originally 06/29/2022)   INFLUENZA VACCINE  03/15/2023 (Originally 03/14/2022)   Medicare Annual Wellness (AWV)  09/06/2023   DTaP/Tdap/Td (3 - Td or Tdap) 05/13/2029   Pneumonia Vaccine 45+ Years old  Completed   Hepatitis C Screening  Completed   Zoster Vaccines- Shingrix  Completed   HPV VACCINES  Aged Out   Fecal DNA (Cologuard)  Discontinued    Health Maintenance  There are no preventive care reminders to display for this patient.   Colorectal cancer screening: No longer required.   Lung Cancer Screening: (Low Dose CT Chest recommended if Age 57-80 years, 30 pack-year currently smoking OR  have quit w/in 15years.) does not qualify.   Additional Screening:  Hepatitis C Screening: does qualify; Completed 06/16/2016  Vision Screening: Recommended annual ophthalmology exams for early detection of glaucoma and other disorders of the eye. Is the patient up to date with their annual eye exam?  Yes  Who is the provider or what is the name of the office in which the patient attends annual eye exams? Forgot name If pt is not established with a provider, would they like to be referred to a provider to establish care? No .   Dental Screening: Recommended annual dental exams for proper oral hygiene  Community Resource Referral / Chronic Care Management: CRR required this visit?  No   CCM required this visit?  No      Plan:     I have personally reviewed and noted the following in the patient's chart:   Medical and social history Use of alcohol, tobacco or illicit drugs  Current medications and supplements including opioid prescriptions. Patient is not currently taking opioid prescriptions. Functional ability and status Nutritional status Physical activity Advanced directives List of other physicians Hospitalizations, surgeries, and ER visits in previous 12 months Vitals Screenings to include cognitive, depression, and falls Referrals and appointments  In addition, I have reviewed and discussed with patient certain preventive protocols, quality metrics, and best practice recommendations. A written personalized care plan for preventive services as well as general preventive health recommendations were provided to patient.     Sandrea Hammond, LPN   8/59/2924   Nurse Notes: He has a knot on his mid back - Dr Redmond School has seen it before, but it is getting larger, not painful. Still has itching intermittently, no known cause - has follow up appt 1/31. It was scheduled as AWV, but since I did AWV today I changed it to Office Visit - Patient requested CPE, but schedule wouldn't  allow it.

## 2022-09-05 NOTE — Telephone Encounter (Signed)
Please make appt prior to office visit for fasting labs. He wanted a CPE, but it wouldn't allow me to schedule one.

## 2022-09-05 NOTE — Patient Instructions (Signed)
[name] , Thank you for taking time to come for your Medicare Wellness Visit. I appreciate your ongoing commitment to your health goals. Please review the following plan we discussed and let me know if I can assist you in the future.   These are the goals we discussed:  Goals      Patient Stated     Wants to get more active. Stop worrying so much.        This is a list of the screening recommended for you and due dates:  Health Maintenance  Topic Date Due   COVID-19 Vaccine (7 - 2023-24 season) 09/21/2022*   Flu Shot  03/15/2023*   Medicare Annual Wellness Visit  09/06/2023   DTaP/Tdap/Td vaccine (3 - Td or Tdap) 05/13/2029   Pneumonia Vaccine  Completed   Hepatitis C Screening: USPSTF Recommendation to screen - Ages 35-79 yo.  Completed   Zoster (Shingles) Vaccine  Completed   HPV Vaccine  Aged Out   Cologuard (Stool DNA test)  Discontinued  *Topic was postponed. The date shown is not the original due date.    Advanced directives: in chart  Conditions/risks identified: Aim for 30 minutes of exercise or brisk walking, 6-8 glasses of water, and 5 servings of fruits and vegetables each day.   Next appointment: Follow up in one year for your annual wellness visit.   Preventive Care 15 Years and Older, Male  Preventive care refers to lifestyle choices and visits with your health care provider that can promote health and wellness. What does preventive care include? A yearly physical exam. This is also called an annual well check. Dental exams once or twice a year. Routine eye exams. Ask your health care provider how often you should have your eyes checked. Personal lifestyle choices, including: Daily care of your teeth and gums. Regular physical activity. Eating a healthy diet. Avoiding tobacco and drug use. Limiting alcohol use. Practicing safe sex. Taking low doses of aspirin every day. Taking vitamin and mineral supplements as recommended by your health care  provider. What happens during an annual well check? The services and screenings done by your health care provider during your annual well check will depend on your age, overall health, lifestyle risk factors, and family history of disease. Counseling  Your health care provider may ask you questions about your: Alcohol use. Tobacco use. Drug use. Emotional well-being. Home and relationship well-being. Sexual activity. Eating habits. History of falls. Memory and ability to understand (cognition). Work and work Statistician. Screening  You may have the following tests or measurements: Height, weight, and BMI. Blood pressure. Lipid and cholesterol levels. These may be checked every 5 years, or more frequently if you are over 28 years old. Skin check. Lung cancer screening. You may have this screening every year starting at age 67 if you have a 30-pack-year history of smoking and currently smoke or have quit within the past 15 years. Fecal occult blood test (FOBT) of the stool. You may have this test every year starting at age 2. Flexible sigmoidoscopy or colonoscopy. You may have a sigmoidoscopy every 5 years or a colonoscopy every 10 years starting at age 41. Prostate cancer screening. Recommendations will vary depending on your family history and other risks. Hepatitis C blood test. Hepatitis B blood test. Sexually transmitted disease (STD) testing. Diabetes screening. This is done by checking your blood sugar (glucose) after you have not eaten for a while (fasting). You may have this done every 1-3 years. Abdominal  aortic aneurysm (AAA) screening. You may need this if you are a current or former smoker. Osteoporosis. You may be screened starting at age 67 if you are at high risk. Talk with your health care provider about your test results, treatment options, and if necessary, the need for more tests. Vaccines  Your health care provider may recommend certain vaccines, such  as: Influenza vaccine. This is recommended every year. Tetanus, diphtheria, and acellular pertussis (Tdap, Td) vaccine. You may need a Td booster every 10 years. Zoster vaccine. You may need this after age 40. Pneumococcal 13-valent conjugate (PCV13) vaccine. One dose is recommended after age 53. Pneumococcal polysaccharide (PPSV23) vaccine. One dose is recommended after age 72. Talk to your health care provider about which screenings and vaccines you need and how often you need them. This information is not intended to replace advice given to you by your health care provider. Make sure you discuss any questions you have with your health care provider. Document Released: 08/27/2015 Document Revised: 04/19/2016 Document Reviewed: 06/01/2015 Elsevier Interactive Patient Education  2017 [place] Prevention in the Home Falls can cause injuries. They can happen to people of all ages. There are many things you can do to make your home safe and to help prevent falls. What can I do on the outside of my home? Regularly fix the edges of walkways and driveways and fix any cracks. Remove anything that might make you trip as you walk through a door, such as a raised step or threshold. Trim any bushes or trees on the path to your home. Use bright outdoor lighting. Clear any walking paths of anything that might make someone trip, such as rocks or tools. Regularly check to see if handrails are loose or broken. Make sure that both sides of any steps have handrails. Any raised decks and porches should have guardrails on the edges. Have any leaves, snow, or ice cleared regularly. Use sand or salt on walking paths during winter. Clean up any spills in your garage right away. This includes oil or grease spills. What can I do in the bathroom? Use night lights. Install grab bars by the toilet and in the tub and shower. Do not use towel bars as grab bars. Use non-skid mats or decals in the tub or  shower. If you need to sit down in the shower, use a plastic, non-slip stool. Keep the floor dry. Clean up any water that spills on the floor as soon as it happens. Remove soap buildup in the tub or shower regularly. Attach bath mats securely with double-sided non-slip rug tape. Do not have throw rugs and other things on the floor that can make you trip. What can I do in the bedroom? Use night lights. Make sure that you have a light by your bed that is easy to reach. Do not use any sheets or blankets that are too big for your bed. They should not hang down onto the floor. Have a firm chair that has side arms. You can use this for support while you get dressed. Do not have throw rugs and other things on the floor that can make you trip. What can I do in the kitchen? Clean up any spills right away. Avoid walking on wet floors. Keep items that you use a lot in easy-to-reach places. If you need to reach something above you, use a strong step stool that has a grab bar. Keep electrical cords out of the way. Do not use  floor polish or wax that makes floors slippery. If you must use wax, use non-skid floor wax. Do not have throw rugs and other things on the floor that can make you trip. What can I do with my stairs? Do not leave any items on the stairs. Make sure that there are handrails on both sides of the stairs and use them. Fix handrails that are broken or loose. Make sure that handrails are as long as the stairways. Check any carpeting to make sure that it is firmly attached to the stairs. Fix any carpet that is loose or worn. Avoid having throw rugs at the top or bottom of the stairs. If you do have throw rugs, attach them to the floor with carpet tape. Make sure that you have a light switch at the top of the stairs and the bottom of the stairs. If you do not have them, ask someone to add them for you. What else can I do to help prevent falls? Wear shoes that: Do not have high heels. Have  rubber bottoms. Are comfortable and fit you well. Are closed at the toe. Do not wear sandals. If you use a stepladder: Make sure that it is fully opened. Do not climb a closed stepladder. Make sure that both sides of the stepladder are locked into place. Ask someone to hold it for you, if possible. Clearly mark and make sure that you can see: Any grab bars or handrails. First and last steps. Where the edge of each step is. Use tools that help you move around (mobility aids) if they are needed. These include: Canes. Walkers. Scooters. Crutches. Turn on the lights when you go into a dark area. Replace any light bulbs as soon as they burn out. Set up your furniture so you have a clear path. Avoid moving your furniture around. If any of your floors are uneven, fix them. If there are any pets around you, be aware of where they are. Review your medicines with your doctor. Some medicines can make you feel dizzy. This can increase your chance of falling. Ask your doctor what other things that you can do to help prevent falls. This information is not intended to replace advice given to you by your health care provider. Make sure you discuss any questions you have with your health care provider. Document Released: 05/27/2009 Document Revised: 01/06/2016 Document Reviewed: 09/04/2014 Elsevier Interactive Patient Education  2017 [employer].

## 2022-09-06 ENCOUNTER — Other Ambulatory Visit: Payer: Self-pay | Admitting: Family Medicine

## 2022-09-06 DIAGNOSIS — R7301 Impaired fasting glucose: Secondary | ICD-10-CM

## 2022-09-06 DIAGNOSIS — I10 Essential (primary) hypertension: Secondary | ICD-10-CM

## 2022-09-06 DIAGNOSIS — E7800 Pure hypercholesterolemia, unspecified: Secondary | ICD-10-CM

## 2022-09-06 NOTE — Telephone Encounter (Signed)
Lab appointment scheduled for 09/12/2022 at [facility]. Which labs did you want to order? Also, patient wants to know if you need to check a urine sample during his physical. If so, he wants an order placed for a urine test, so he can have the urine checked during the lab appointment.

## 2022-09-11 DIAGNOSIS — L821 Other seborrheic keratosis: Secondary | ICD-10-CM | POA: Diagnosis not present

## 2022-09-11 DIAGNOSIS — L503 Dermatographic urticaria: Secondary | ICD-10-CM | POA: Diagnosis not present

## 2022-09-11 DIAGNOSIS — L304 Erythema intertrigo: Secondary | ICD-10-CM | POA: Diagnosis not present

## 2022-09-11 DIAGNOSIS — L508 Other urticaria: Secondary | ICD-10-CM | POA: Diagnosis not present

## 2022-09-12 ENCOUNTER — Other Ambulatory Visit: Payer: Medicare PPO

## 2022-09-12 DIAGNOSIS — R7301 Impaired fasting glucose: Secondary | ICD-10-CM

## 2022-09-12 DIAGNOSIS — I10 Essential (primary) hypertension: Secondary | ICD-10-CM | POA: Diagnosis not present

## 2022-09-12 DIAGNOSIS — E7800 Pure hypercholesterolemia, unspecified: Secondary | ICD-10-CM | POA: Diagnosis not present

## 2022-09-13 ENCOUNTER — Encounter: Payer: Self-pay | Admitting: Family Medicine

## 2022-09-13 ENCOUNTER — Ambulatory Visit: Payer: Medicare PPO | Admitting: Family Medicine

## 2022-09-13 VITALS — BP 132/80 | HR 92 | Wt 201.0 lb

## 2022-09-13 DIAGNOSIS — E7800 Pure hypercholesterolemia, unspecified: Secondary | ICD-10-CM

## 2022-09-13 DIAGNOSIS — R7301 Impaired fasting glucose: Secondary | ICD-10-CM | POA: Diagnosis not present

## 2022-09-13 DIAGNOSIS — Z8546 Personal history of malignant neoplasm of prostate: Secondary | ICD-10-CM | POA: Diagnosis not present

## 2022-09-13 DIAGNOSIS — I10 Essential (primary) hypertension: Secondary | ICD-10-CM

## 2022-09-13 DIAGNOSIS — N401 Enlarged prostate with lower urinary tract symptoms: Secondary | ICD-10-CM

## 2022-09-13 DIAGNOSIS — D171 Benign lipomatous neoplasm of skin and subcutaneous tissue of trunk: Secondary | ICD-10-CM | POA: Insufficient documentation

## 2022-09-13 DIAGNOSIS — N529 Male erectile dysfunction, unspecified: Secondary | ICD-10-CM

## 2022-09-13 DIAGNOSIS — E119 Type 2 diabetes mellitus without complications: Secondary | ICD-10-CM | POA: Diagnosis not present

## 2022-09-13 DIAGNOSIS — M1990 Unspecified osteoarthritis, unspecified site: Secondary | ICD-10-CM

## 2022-09-13 DIAGNOSIS — R351 Nocturia: Secondary | ICD-10-CM

## 2022-09-13 DIAGNOSIS — K219 Gastro-esophageal reflux disease without esophagitis: Secondary | ICD-10-CM

## 2022-09-13 LAB — CBC WITH DIFFERENTIAL/PLATELET
Basophils Absolute: 0 10*3/uL (ref 0.0–0.2)
Basos: 1 %
EOS (ABSOLUTE): 0.1 10*3/uL (ref 0.0–0.4)
Eos: 2 %
Hematocrit: 39.7 % (ref 37.5–51.0)
Hemoglobin: 13.4 g/dL (ref 13.0–17.7)
Immature Grans (Abs): 0 10*3/uL (ref 0.0–0.1)
Immature Granulocytes: 0 %
Lymphocytes Absolute: 2.6 10*3/uL (ref 0.7–3.1)
Lymphs: 52 %
MCH: 28.8 pg (ref 26.6–33.0)
MCHC: 33.8 g/dL (ref 31.5–35.7)
MCV: 85 fL (ref 79–97)
Monocytes Absolute: 0.4 10*3/uL (ref 0.1–0.9)
Monocytes: 7 %
Neutrophils Absolute: 1.9 10*3/uL (ref 1.4–7.0)
Neutrophils: 38 %
Platelets: 173 10*3/uL (ref 150–450)
RBC: 4.66 x10E6/uL (ref 4.14–5.80)
RDW: 13.4 % (ref 11.6–15.4)
WBC: 5 10*3/uL (ref 3.4–10.8)

## 2022-09-13 LAB — COMPREHENSIVE METABOLIC PANEL
ALT: 22 IU/L (ref 0–44)
AST: 18 IU/L (ref 0–40)
Albumin/Globulin Ratio: 1.3 (ref 1.2–2.2)
Albumin: 3.8 g/dL (ref 3.8–4.8)
Alkaline Phosphatase: 75 IU/L (ref 44–121)
BUN/Creatinine Ratio: 15 (ref 10–24)
BUN: 14 mg/dL (ref 8–27)
Bilirubin Total: 0.3 mg/dL (ref 0.0–1.2)
CO2: 22 mmol/L (ref 20–29)
Calcium: 8.7 mg/dL (ref 8.6–10.2)
Chloride: 101 mmol/L (ref 96–106)
Creatinine, Ser: 0.94 mg/dL (ref 0.76–1.27)
Globulin, Total: 2.9 g/dL (ref 1.5–4.5)
Glucose: 102 mg/dL — ABNORMAL HIGH (ref 70–99)
Potassium: 4 mmol/L (ref 3.5–5.2)
Sodium: 142 mmol/L (ref 134–144)
Total Protein: 6.7 g/dL (ref 6.0–8.5)
eGFR: 84 mL/min/{1.73_m2} (ref >59)

## 2022-09-13 LAB — LIPID PANEL
Chol/HDL Ratio: 2.5 ratio (ref 0.0–5.0)
Cholesterol, Total: 130 mg/dL (ref 100–199)
HDL: 51 mg/dL (ref >39)
LDL Chol Calc (NIH): 66 mg/dL (ref 0–99)
Triglycerides: 60 mg/dL (ref 0–149)
VLDL Cholesterol Cal: 13 mg/dL (ref 5–40)

## 2022-09-13 LAB — POCT GLYCOSYLATED HEMOGLOBIN (HGB A1C): Hemoglobin A1C: 6.8 % — AB (ref 4.0–5.6)

## 2022-09-13 MED ORDER — ATORVASTATIN CALCIUM 10 MG PO TABS: 10.0000 mg | ORAL_TABLET | Freq: Every day | ORAL | 3 refills | Status: DC

## 2022-09-13 MED ORDER — TADALAFIL 5 MG PO TABS: 5.0000 mg | ORAL_TABLET | Freq: Every day | ORAL | 3 refills | Status: AC

## 2022-09-13 MED ORDER — AMLODIPINE-OLMESARTAN 5-20 MG PO TABS: 1.0000 | ORAL_TABLET | Freq: Every day | ORAL | 3 refills | Status: DC

## 2022-09-13 MED ORDER — TAMSULOSIN HCL 0.4 MG PO CAPS: 0.4000 mg | ORAL_CAPSULE | Freq: Two times a day (BID) | ORAL | 3 refills | Status: AC

## 2022-09-13 NOTE — Progress Notes (Signed)
   Subjective:    Patient ID: Ernest Ora., male    DOB: [birth year], 77 y.o.   MRN: [id number]  HPI He is here for an interval evaluation.  He does note some weakness in his legs when he goes upstairs but not down.  He had no chest pain, shortness of breath with this.  He walks regularly and does not notice any difficulty with this.  This has been going on for several months.  He does have underlying reflux but presently is on no medication.  Keeps himself on a bulk laxative to help keep his BMs regularly.  He is taking Cialis 5 mg and Flomax to help with his bladder function and is having difficulty with ED but has not tried any higher dose of the Cialis.  He continues on Azor for his blood pressure and is having no difficulty with that.  He also continues on low-dose Lipitor.  His exercise has been limited due to helping his wife who recently had a fracture of her patella.  Otherwise he has no particular concerns or complaints.   Review of Systems  All other systems reviewed and are negative.      Objective:   Physical Exam Alert and in no distress. Tympanic membranes and canals are normal. Pharyngeal area is normal. Neck is supple without adenopathy or thyromegaly. Cardiac exam shows a regular sinus rhythm without murmurs or gallops. Lungs are clear to auscultation. Hemoglobin A1c 6.8       Assessment & Plan:  Essential hypertension - Plan: amLODipine-olmesartan (AZOR) 5-20 MG tablet  Gastroesophageal reflux disease without esophagitis  Arthritis  Impaired fasting glucose - Plan: HgB A1c  HYPERCHOLESTEROLEMIA - Plan: atorvastatin (LIPITOR) 10 MG tablet  History of prostate cancer  Benign prostatic hyperplasia with nocturia - Plan: tadalafil (CIALIS) 5 MG tablet, tamsulosin (FLOMAX) 0.4 MG CAPS capsule  Erectile dysfunction, unspecified erectile dysfunction type  New onset type 2 diabetes mellitus ([place]) Several of his medicines were renewed.  Right now he is not  interested in Cialis but would come after his wife's condition improves.  He will call when he needs a refill. As discussed there is new onset of diabetes in regard to his risk for stroke, blindness, heart disease, kidney failure, vascular or neurologic problems.  Discussed regular exercise and dietary modification especially in regard to carbohydrates.  Recommend he go to the American diabetes Association website to get more information concerning this.  He is also started a regular exercise program.  Discussed the fact that at this time the main issue is keeping his blood pressure and cholesterol under good control. Ask him to make notes concerning questions about diabetes and I will see him in 3 months.  50 minutes discussing all of these issues with him.

## 2022-11-23 ENCOUNTER — Telehealth: Payer: Self-pay

## 2022-11-23 NOTE — Telephone Encounter (Signed)
Pt called to advise he believes he pulled a muscle yesterday. He has pain when trying to move around radiating down his right side. He has been taking advil but is asking if he can get a prescription for something stronger. Tylenol also has not helping. He is trying to avoid medications that would have a negative effect on his stomach.

## 2022-11-23 NOTE — Telephone Encounter (Signed)
Appt scheduled

## 2022-11-24 ENCOUNTER — Encounter: Payer: Self-pay | Admitting: Medical

## 2022-11-24 ENCOUNTER — Ambulatory Visit: Payer: Medicare PPO | Admitting: Medical

## 2022-11-24 VITALS — BP 130/70 | HR 81 | Temp 97.8°F | Wt 203.2 lb

## 2022-11-24 DIAGNOSIS — S39012A Strain of muscle, fascia and tendon of lower back, initial encounter: Secondary | ICD-10-CM | POA: Diagnosis not present

## 2022-11-24 DIAGNOSIS — M62838 Other muscle spasm: Secondary | ICD-10-CM | POA: Diagnosis not present

## 2022-11-24 MED ORDER — OXYCODONE HCL 5 MG PO TABS: 5.0000 mg | ORAL_TABLET | Freq: Two times a day (BID) | ORAL | 0 refills | Status: DC | PRN

## 2022-11-24 MED ORDER — TIZANIDINE HCL 4 MG PO TABS: 4.0000 mg | ORAL_TABLET | Freq: Two times a day (BID) | ORAL | 0 refills | Status: DC | PRN

## 2022-11-24 NOTE — Patient Instructions (Addendum)
Back pain, strain, pulled muscle  Recommendations You can continue Tylenol once or twice daily for pain, but alternate with Advil by at least 4-6 hours You can use Advil/Ibuprofen 200mg , 2 tablets twice daily this weekend, alternate 4-6 hours with tylenol Begin tizanidine muscle relaxer once or twice daily for the next 3-4 days for muscle spasm. Caution as this can cause sedation.   For worse pain/breakthrough pain, you can use short term Oxycodone pain pill.  You can use this up to 2 times daily, but caution as this can cause sedation Continue to stretch this weekend Use rest, but be up and moving around some No heavy lifting this weekend I suspect symptoms should resolve by mid next week If worse pain, or new symptoms such as worse pain, numbness, tingling or weakness, then recheck

## 2022-11-24 NOTE — Progress Notes (Signed)
Subjective:  Ernest Hughes. is a 77 y.o. male who presents for Chief Complaint  Patient presents with   muscle strain    Muscle strain right sided pain. Lifting something on Tuesday and having pain that will start at back and go down buttocks down leg     Tuesday 2 days ago was trying to set a basketball goal for his kids.   Was lifting and moving the pole.  Thinks he pulled a muscle.   Here for possible muscle strain.   General uses tylenol or advil if he gets pain.    Has pain in right back radiating to buttock.  No numbness, no tingling, no weakness.  Pain has impaired sleep this week.  Is a side sleeper, really hurts if he tries to sleep on that side.  No other aggravating or relieving factors.    No other c/o.  The following portions of the patient's history were reviewed and updated as appropriate: allergies, current medications, past family history, past medical history, past social history, past surgical history and problem list.  ROS Otherwise as in subjective above  Objective: BP 130/70   Pulse 81   Temp 97.8 F (36.6 C)   Wt 203 lb 3.2 oz (92.2 kg)   BMI 29.16 kg/m   General appearance: alert, no distress, well developed, well nourished Back: Tender right lumbar paraspinal region and spasm, otherwise nontender, range of motion relatively full but does have some pain in the same area with range of motion.  He does have about 6 cm lipoma in the right mid back.  No other deformity Pulses: 2+ radial pulses, 2+ pedal pulses, normal cap refill Ext: no edema    Assessment: Encounter Diagnoses  Name Primary?   Back strain, initial encounter Yes   Muscle spasm      Plan: Discussed symptoms, concerns, treatment recommendations as below.    Patient Instructions  Back pain, strain, pulled muscle  Recommendations You can continue Tylenol once or twice daily for pain, but alternate with Advil by at least 4-6 hours You can use Advil/Ibuprofen 200mg , 2 tablets twice  daily this weekend, alternate 4-6 hours with tylenol Begin tizanidine muscle relaxer once or twice daily for the next 3-4 days for muscle spasm. Caution as this can cause sedation.   For worse pain/breakthrough pain, you can use short term Oxycodone pain pill.  You can use this up to 2 times daily, but caution as this can cause sedation Continue to stretch this weekend Use rest, but be up and moving around some No heavy lifting this weekend I suspect symptoms should resolve by mid next week If worse pain, or new symptoms such as worse pain, numbness, tingling or weakness, then recheck     Carols was seen today for muscle strain.  Diagnoses and all orders for this visit:  Back strain, initial encounter  Muscle spasm  Other orders -     tiZANidine (ZANAFLEX) 4 MG tablet; Take 1 tablet (4 mg total) by mouth 2 (two) times daily as needed for muscle spasms. -     oxyCODONE (ROXICODONE) 5 MG immediate release tablet; Take 1 tablet (5 mg total) by mouth 2 (two) times daily as needed for severe pain.    Follow up: prn

## 2022-12-12 ENCOUNTER — Ambulatory Visit: Payer: Medicare PPO | Admitting: Family Medicine

## 2022-12-26 ENCOUNTER — Ambulatory Visit: Payer: Medicare PPO | Admitting: Family Medicine

## 2022-12-26 VITALS — BP 128/78 | HR 60 | Ht 69.5 in | Wt 196.0 lb

## 2022-12-26 DIAGNOSIS — E119 Type 2 diabetes mellitus without complications: Secondary | ICD-10-CM

## 2022-12-26 LAB — POCT GLYCOSYLATED HEMOGLOBIN (HGB A1C): Hemoglobin A1C: 6 % — AB (ref 4.0–5.6)

## 2022-12-26 NOTE — Progress Notes (Signed)
   Subjective:    Patient ID: Ernest Hughes., male    DOB: 12-29-1945, 77 y.o.   MRN: [id number]  HPI He is here for a recheck.  On a previous visit his hemoglobin A1c was 6.8.  I discussed diet and exercise with him as well as have him go to the American diabetes Association website.  He did make an effort to do this and has made some diet and exercise changes.  He is here to see if it is done any good.   Review of Systems     Objective:   Physical Exam Alert and in no distress otherwise not examined.  Hemoglobin A1c is 6.0.       Assessment & Plan:  New onset type 2 diabetes mellitus (HCC) - Plan: HgB A1c I again explained the fact that he indeed did have diabetes although he was concerned that he did not.  Explained he now has his A1c down in a good range and he is needs to continue to do his diet and exercise.  Splane that down the road if he continues to maintain an A1c below 6.5 we can potentially relabeled this as diabetes in remission.  Recheck here in about 6 months.

## 2023-03-12 DIAGNOSIS — L731 Pseudofolliculitis barbae: Secondary | ICD-10-CM | POA: Diagnosis not present

## 2023-03-12 DIAGNOSIS — L509 Urticaria, unspecified: Secondary | ICD-10-CM | POA: Diagnosis not present

## 2023-03-12 DIAGNOSIS — L219 Seborrheic dermatitis, unspecified: Secondary | ICD-10-CM | POA: Diagnosis not present

## 2023-03-12 DIAGNOSIS — L821 Other seborrheic keratosis: Secondary | ICD-10-CM | POA: Diagnosis not present

## 2023-05-01 ENCOUNTER — Ambulatory Visit (INDEPENDENT_AMBULATORY_CARE_PROVIDER_SITE_OTHER): Payer: Medicare PPO | Admitting: Family Medicine

## 2023-05-01 ENCOUNTER — Encounter: Payer: Self-pay | Admitting: Family Medicine

## 2023-05-01 VITALS — BP 130/80 | HR 78 | Ht 69.0 in | Wt 195.8 lb

## 2023-05-01 DIAGNOSIS — E1169 Type 2 diabetes mellitus with other specified complication: Secondary | ICD-10-CM

## 2023-05-01 DIAGNOSIS — Z23 Encounter for immunization: Secondary | ICD-10-CM | POA: Diagnosis not present

## 2023-05-01 DIAGNOSIS — I152 Hypertension secondary to endocrine disorders: Secondary | ICD-10-CM

## 2023-05-01 DIAGNOSIS — E785 Hyperlipidemia, unspecified: Secondary | ICD-10-CM

## 2023-05-01 DIAGNOSIS — E119 Type 2 diabetes mellitus without complications: Secondary | ICD-10-CM | POA: Diagnosis not present

## 2023-05-01 DIAGNOSIS — M549 Dorsalgia, unspecified: Secondary | ICD-10-CM | POA: Diagnosis not present

## 2023-05-01 DIAGNOSIS — E1159 Type 2 diabetes mellitus with other circulatory complications: Secondary | ICD-10-CM | POA: Diagnosis not present

## 2023-05-01 LAB — POCT GLYCOSYLATED HEMOGLOBIN (HGB A1C): Hemoglobin A1C: 6 % — AB (ref 4.0–5.6)

## 2023-05-01 NOTE — Progress Notes (Signed)
Subjective:    Patient ID: Ernest Hughes., male    DOB: [birth year], 77 y.o.   MRN: [id number]  HPI He is here for a follow-up visit concerning his diabetes.  He has been making diet and exercise changes and feels very good about this.  Presently he is on no diabetes medicines but continues on Azor and is also taking Lipitor. He also continues to have difficulty with right sided low back pain over the last several months.  It does tend to come and go.  Is originally precipitated by lifting something incorrectly.  He has been intermittently using muscle relaxers.  Review of Systems     Objective:    Physical Exam Alert and in no distress.  Good motion of his back.  Palpable tenderness noted to the right upper paravertebral musculature.  The tenderness is approximately 4 cm in size Normal hip motion.  Negative straight leg raising.  Normal DTRs. Hemoglobin A1c 6.0     Assessment & Plan:   Problem List Items Addressed This Visit     Hyperlipidemia associated with type 2 diabetes mellitus (HCC)   Hypertension associated with diabetes (HCC)   New onset type 2 diabetes mellitus (HCC) - Primary   Other Visit Diagnoses     Need for influenza vaccination       Relevant Orders   Flu Vaccine Trivalent High Dose (Fluad) (Completed)   Musculoskeletal back pain         I congratulated him on making diet and exercise changes and keeping his A1c in the good range.  Plan to recheck that in roughly 6 months. Heat for 20 minutes 3 times per day.  Gentle stretching after that.  Proper posturing

## 2023-05-01 NOTE — Patient Instructions (Signed)
Heat for 20 minutes 3 times per day.  Gentle stretching after that.  Proper posturing

## 2023-05-09 ENCOUNTER — Encounter: Payer: Self-pay | Admitting: Physical Therapy

## 2023-05-09 ENCOUNTER — Ambulatory Visit: Payer: Medicare PPO | Admitting: Physical Therapy

## 2023-05-09 ENCOUNTER — Other Ambulatory Visit: Payer: Self-pay

## 2023-05-09 DIAGNOSIS — M6281 Muscle weakness (generalized): Secondary | ICD-10-CM

## 2023-05-09 DIAGNOSIS — R262 Difficulty in walking, not elsewhere classified: Secondary | ICD-10-CM

## 2023-05-09 DIAGNOSIS — M5459 Other low back pain: Secondary | ICD-10-CM

## 2023-05-09 NOTE — Therapy (Signed)
OUTPATIENT PHYSICAL THERAPY THORACOLUMBAR EVALUATION   Patient Name: Ernest Hughes. MRN: [id number] DOB:11/20/1945, 77 y.o., male Today's Date: 05/09/2023  END OF SESSION:  PT End of Session - 05/09/23 1118     Visit Number 1    Number of Visits 20    Date for PT Re-Evaluation 07/20/23    Authorization Type [employer] applied for 12 visits    PT Start Time 1106    PT Stop Time 1150    PT Time Calculation (min) 44 min    Activity Tolerance Patient tolerated treatment well    Behavior During Therapy WFL for tasks assessed/performed             Past Medical History:  Diagnosis Date   Arthritis    Asthma    as a baby   BPH (benign prostatic hyperplasia)    Cancer (HCC)    PROSTATE--treated with radiation   Diverticulosis    GERD (gastroesophageal reflux disease)    Hyperlipidemia    Hypertension    Past Surgical History:  Procedure Laterality Date   BELPHAROPTOSIS REPAIR  12/2013   bilateral   cataract surgery Left 2019   COLONOSCOPY     Dr. Jarold Motto   Patient Active Problem List   Diagnosis Date Noted   Lipoma of back 09/13/2022   New onset type 2 diabetes mellitus (HCC) 09/13/2022   Benign prostatic hyperplasia with nocturia 08/29/2021   History of arthroscopic knee surgery 11/18/2020   Arthritis 05/10/2020   History of cataract surgery, unspecified laterality 04/28/2019   Erectile dysfunction 04/28/2019   History of prostate cancer 05/22/2011   GERD 02/02/2009   Hyperlipidemia associated with type 2 diabetes mellitus (HCC) 02/01/2009   Hypertension associated with diabetes (HCC) 02/01/2009   Diverticulosis of colon 02/01/2009    PCP: Ronnald Nian, MD   REFERRING PROVIDER: Ronnald Nian, MD   REFERRING DIAG:  Diagnosis  M54.9 (ICD-10-CM) - Musculoskeletal back pain    Rationale for Evaluation and Treatment: Rehabilitation  THERAPY DIAG:  Other low back pain - Plan: PT plan of care cert/re-cert  Muscle weakness (generalized) - Plan: PT  plan of care cert/re-cert  Difficulty in walking, not elsewhere classified - Plan: PT plan of care cert/re-cert  ONSET DATE: "a while"  SUBJECTIVE:                                                                                                                                                                                          SUBJECTIVE:  SUBJECTIVE STATEMENT: Pt arriving reporting not taking over the counter pain meds prior to therapy. Pt arriving reporting 5/10 in Rt side low back. Pt stating this pain has been on-going for a while. Pt stating he use to play golf, walk and go to gym for recreation and wants to get back to those activities. Pt reporting pain is worse after bending and lifting and he has been wearing a lumbar support with those activities which has helped some.   PERTINENT HISTORY:  DM, cancer (prostate treated with radiation), GERD, BPH, Diverticulosis, arthritis, asthma, HTN, hyperlipidemia, left meniscus surgery  PAIN:  NPRS scale: 5/10 Pain location: Rt sided low back  Pain description: achy Aggravating factors: bending, prolonged standing, sitting Relieving factors: muscle relaxer, over the counter meds, walking  PRECAUTIONS: None  WEIGHT BEARING RESTRICTIONS: No  FALLS:  Has patient fallen in last 6 months? No  LIVING ENVIRONMENT: Lives with: lives with their family and lives with their spouse Lives in: House/apartment Stairs: 5 steps outside rail on both,  1 flight inside: rail on left Has following equipment at home: None  OCCUPATION: retired  PLOF: Independent  PATIENT GOALS: walk more get back to gym and playing golf  Next MD Visit: follow up as needed   OBJECTIVE:   DIAGNOSTIC FINDINGS: 12/14/2016 Scoliosis lumbar spine. Diffuse degenerative changes  multilevel disc degeneration again noted. No change from prior exam. No acute bony abnormality identified. No evidence of fracture. Pelvic calcifications consistent with phleboliths.   IMPRESSION: Scoliosis lumbar spine. Diffuse degenerative change with multilevel disc degeneration again noted. No change from prior exam. No acute abnormality identified.  PATIENT SURVEYS:  05/09/23: FOTO eval:    60%   SCREENING FOR RED FLAGS: Bowel or bladder incontinence: No Cauda equina syndrome: No  COGNITION: Overall cognitive status: WFL normal      SENSATION: WFL  MUSCLE LENGTH: Hamstrings: Right 58 deg; Left 45 deg    PALPATION: 05/09/23 TTP: Rt QL and bilateral lumbar paraspinals  LUMBAR ROM:   AROM 05/09/23  Flexion 55 finger tips to above knees  Extension 15  Right lateral flexion 22  Left lateral flexion 20  Right rotation Limited 50%  Left rotation Limited 25%   (Blank rows = not tested)  LOWER EXTREMITY ROM:      Right 05/09/23 Supine active Left 05/09/23 Supine active  Hip flexion 100 98  Hip extension    Hip abduction    Hip adduction    Hip internal rotation    Hip external rotation    Knee flexion 120 125  Knee extension    Ankle dorsiflexion    Ankle plantarflexion    Ankle inversion    Ankle eversion     (Blank rows = not tested)  LOWER EXTREMITY MMT:    MMT Right 05/09/23 Left 05/09/23  Hip flexion 4 4  Hip extension    Hip abduction    Hip adduction 5 5  Hip internal rotation    Hip external rotation    Knee flexion 5 5  Knee extension 5 5  Ankle dorsiflexion    Ankle plantarflexion    Ankle inversion    Ankle eversion     (Blank rows = not tested)  LUMBAR SPECIAL TESTS:  05/09/23 Slump test: Negative bilaterally  FUNCTIONAL TESTS:  05/09/23 5 times sit to stand: 17.8 seconds UE support  GAIT: 05/09/23 Distance walked: clinic distance Assistive device utilized: None Level of assistance: Complete Independence Comments: forward  flexed at hips and head  TODAY'S TREATMENT:                                                                                                         DATE:  05/09/23  Therex: HEP instruction/performance c cues for techniques, handout provided.  Trial set performed of each for comprehension and symptom assessment.  See below for exercise list  PATIENT EDUCATION:  Education details: HEP, POC, DN  Person educated: Patient Education method: Explanation, Demonstration, Verbal cues, and Handouts for HEP and DN Education comprehension: verbalized understanding, returned demonstration, and verbal cues required  HOME EXERCISE PROGRAM: Access Code: [email or web address] URL: [email or web address] Date: 05/09/2023 Prepared by: Narda Amber  Exercises - Supine Bridge  - 1-2 x daily - 7 x weekly - 2 sets - 10 reps - 5 seconds hold - Supine Lower Trunk Rotation  - 1-2 x daily - 7 x weekly - 3-5 reps - 10 seconds hold - Hooklying Single Knee to Chest Stretch  - 1-2 x daily - 7 x weekly - 3-5 sets - 10 seconds hold - Seated Hamstring Stretch  - 1-2 x daily - 7 x weekly - 3 reps - 20 seconds hold - Standing Lumbar Extension at Wall - Forearms  - 1-2 x daily - 7 x weekly - 5-10 reps - 10 seconds hold  ASSESSMENT:  CLINICAL IMPRESSION: Patient is a 77 y.o. who comes to clinic with complaints of chronic low back pain which presents worse on the Rt than the left. Pt with pin point pain in Rt side quadratus lumborum. Pt presents with mobility, strength and movement coordination deficits that impair their ability to perform usual daily and recreational functional activities without increase difficulty/symptoms at this time.  Patient to benefit from skilled PT services to address impairments and  limitations to improve to previous level of function without restriction secondary to condition.   OBJECTIVE IMPAIRMENTS: decreased mobility, difficulty walking, decreased ROM, decreased strength, and pain.   ACTIVITY LIMITATIONS: lifting, bending, standing, and stairs  PARTICIPATION LIMITATIONS: driving and community activity, sleeping  PERSONAL FACTORS: 3+ comorbidities: see pertinent history above  are also affecting patient's functional outcome.   REHAB POTENTIAL: Good  CLINICAL DECISION MAKING: Stable/uncomplicated  EVALUATION COMPLEXITY: Low   GOALS: Goals reviewed with patient? Yes  SHORT TERM GOALS: (target date for Short term goals are 3 weeks 06/01/23)  1. Patient will demonstrate independent use of home exercise program to maintain progress from in clinic treatments.  Goal status: New  LONG TERM GOALS: (target dates for all long term goals are 10 weeks  07/20/23 )   1. Patient will demonstrate/report pain at worst less than or equal to 2/10 to facilitate minimal limitation in daily activity secondary to pain symptoms.  Goal status: New   2. Patient will demonstrate independent use of home exercise program to facilitate ability to maintain/progress functional gains from skilled physical therapy services.  Goal status: New   3. Patient will demonstrate FOTO outcome > or = 68 % to indicate reduced disability due to condition.  Goal status: New   4. Patient will  demonstrate lumbar extension 100 % WFL s symptoms to facilitate upright standing, walking posture at PLOF s limitation.  Goal status: New   5.  Pt will be able to lift 20# from floor to counter height using correct body mechanics with no pain.   Goal status: New   6.  Pt will improve his hamstring flexibility to >/= 60 deg bilaterally to help improve overall functional mobility.  Goal status: New   7. Pt will improve his bil trunk rotation to [facility] for return to golf.  Goal status: New      PLAN:  PT FREQUENCY: 1-2x/week  PT DURATION: 10 weeks  PLANNED INTERVENTIONS: Therapeutic exercises, Therapeutic activity, Neuro Muscular re-education, Balance training, Gait training, Patient/Family education, Joint mobilization, Stair training, DME instructions, Dry Needling, Electrical stimulation, Cryotherapy, vasopneumatic device, Moist heat, Taping, Traction Ultrasound, Ionotophoresis 4mg /ml Dexamethasone, and aquatic therapy, Manual therapy.  All included unless contraindicated  PLAN FOR NEXT SESSION: Review HEP knowledge/results,core strengthening,  manual and modalities as needed  Consider DN, handout issued at eval.  Edu pt on using pillows for support in side lying sleeping         Sharmon Leyden, PT, MPT 05/09/2023, 12:04 PM  Referring diagnosis? M54.9 Treatment diagnosis? (if different than referring diagnosis) M54.9, M62.81, R26.2 What was this (referring dx) caused by? []  Surgery []  Fall [x]  Ongoing issue [x]  Arthritis []  Other: ____________  Laterality: []  Rt []  Lt []  Both  Check all possible CPT codes:  *CHOOSE 10 OR LESS*    []  97110 (Therapeutic Exercise)  []  92507 (SLP Treatment)  []  97112 (Neuro Re-ed)   []  92526 (Swallowing Treatment)   []  97116 (Gait Training)   []  [id number] (Cognitive Training, 1st 15 minutes) []  97140 (Manual Therapy)   []  97130 (Cognitive Training, each add'l 15 minutes)  []  52841 (Re-evaluation)                              []  Other, List CPT Code ____________  []  32440 (Therapeutic Activities)     []  97535 (Self Care)   [x]  All codes above (97110 - 97535)  []  97012 (Mechanical Traction)  [x]  97014 (E-stim Unattended)  []  97032 (E-stim manual)  []  97033 (Ionto)  []  97035 (Ultrasound) [x]  97750 (Physical Performance Training) []  [id number] (Aquatic Therapy) []  97016 (Vasopneumatic Device) []  [id number] (Paraffin) []  97034 (Contrast Bath) []  97597 (Wound Care 1st 20 sq cm) []  97598 (Wound Care each add'l 20 sq cm) []  97760  (Orthotic Fabrication, Fitting, Training Initial) []  [id number] (Prosthetic Management and Training Initial) []  [id number] (Orthotic or Prosthetic Training/ Modification Subsequent)

## 2023-05-18 ENCOUNTER — Ambulatory Visit: Payer: Medicare PPO | Admitting: Physical Therapy

## 2023-05-18 ENCOUNTER — Encounter: Payer: Self-pay | Admitting: Physical Therapy

## 2023-05-18 DIAGNOSIS — M5459 Other low back pain: Secondary | ICD-10-CM

## 2023-05-18 DIAGNOSIS — R262 Difficulty in walking, not elsewhere classified: Secondary | ICD-10-CM

## 2023-05-18 DIAGNOSIS — M6281 Muscle weakness (generalized): Secondary | ICD-10-CM | POA: Diagnosis not present

## 2023-05-18 NOTE — Therapy (Signed)
OUTPATIENT PHYSICAL THERAPY THORACOLUMBAR TREATMENT    Patient Name: Ernest Hughes. MRN: [id number] DOB:[birth year], 77 y.o., male Today's Date: 05/18/2023  END OF SESSION:  PT End of Session - 05/18/23 1211     Visit Number 2    Number of Visits 20    Date for PT Re-Evaluation 07/20/23    Authorization Type [employer] applied for 12 visits    PT Start Time 1147    PT Stop Time 1227    PT Time Calculation (min) 40 min    Activity Tolerance Patient tolerated treatment well    Behavior During Therapy WFL for tasks assessed/performed              Past Medical History:  Diagnosis Date   Arthritis    Asthma    as a baby   BPH (benign prostatic hyperplasia)    Cancer (HCC)    PROSTATE--treated with radiation   Diverticulosis    GERD (gastroesophageal reflux disease)    Hyperlipidemia    Hypertension    Past Surgical History:  Procedure Laterality Date   BELPHAROPTOSIS REPAIR  12/2013   bilateral   cataract surgery Left 2019   COLONOSCOPY     Dr. Jarold Motto   Patient Active Problem List   Diagnosis Date Noted   Lipoma of back 09/13/2022   New onset type 2 diabetes mellitus (HCC) 09/13/2022   Benign prostatic hyperplasia with nocturia 08/29/2021   History of arthroscopic knee surgery 11/18/2020   Arthritis 05/10/2020   History of cataract surgery, unspecified laterality 04/28/2019   Erectile dysfunction 04/28/2019   History of prostate cancer 05/22/2011   GERD 02/02/2009   Hyperlipidemia associated with type 2 diabetes mellitus (HCC) 02/01/2009   Hypertension associated with diabetes (HCC) 02/01/2009   Diverticulosis of colon 02/01/2009    PCP: Ronnald Nian, MD   REFERRING PROVIDER: Ronnald Nian, MD   REFERRING DIAG:  Diagnosis  M54.9 (ICD-10-CM) - Musculoskeletal back pain    Rationale for Evaluation and Treatment: Rehabilitation  THERAPY DIAG:  Other low back pain  Muscle weakness (generalized)  Difficulty in walking, not elsewhere  classified  ONSET DATE: "a while"  SUBJECTIVE:                                                                                                                                                                                          SUBJECTIVE:  SUBJECTIVE STATEMENT:  I had a bit of a setback recently, was doing some yardwork and don't know what happened, I was really having a lot of pain. Better but still bothering me, R side was the one involved. HEP is going OK.  PERTINENT HISTORY:  DM, cancer (prostate treated with radiation), GERD, BPH, Diverticulosis, arthritis, asthma, HTN, hyperlipidemia, left meniscus surgery  PAIN:  NPRS scale: 4/10 Pain location: Rt sided low back  Pain description: achy Aggravating factors: bending, prolonged standing, sitting Relieving factors: muscle relaxer, over the counter meds, walking  PRECAUTIONS: None  WEIGHT BEARING RESTRICTIONS: No  FALLS:  Has patient fallen in last 6 months? No  LIVING ENVIRONMENT: Lives with: lives with their family and lives with their spouse Lives in: House/apartment Stairs: 5 steps outside rail on both,  1 flight inside: rail on left Has following equipment at home: None  OCCUPATION: retired  PLOF: Independent  PATIENT GOALS: walk more get back to gym and playing golf  Next MD Visit: follow up as needed   OBJECTIVE:   DIAGNOSTIC FINDINGS: 12/14/2016 Scoliosis lumbar spine. Diffuse degenerative changes multilevel disc degeneration again noted. No change from prior exam. No acute bony abnormality identified. No evidence of fracture. Pelvic calcifications consistent with phleboliths.   IMPRESSION: Scoliosis lumbar spine. Diffuse degenerative change with multilevel disc degeneration again noted. No change from prior exam. No  acute abnormality identified.  PATIENT SURVEYS:  05/09/23: FOTO eval:    60%   SCREENING FOR RED FLAGS: Bowel or bladder incontinence: No Cauda equina syndrome: No  COGNITION: Overall cognitive status: WFL normal      SENSATION: WFL  MUSCLE LENGTH: Hamstrings: Right 58 deg; Left 45 deg    PALPATION: 05/09/23 TTP: Rt QL and bilateral lumbar paraspinals  LUMBAR ROM:   AROM 05/09/23  Flexion 55 finger tips to above knees  Extension 15  Right lateral flexion 22  Left lateral flexion 20  Right rotation Limited 50%  Left rotation Limited 25%   (Blank rows = not tested)  LOWER EXTREMITY ROM:      Right 05/09/23 Supine active Left 05/09/23 Supine active  Hip flexion 100 98  Hip extension    Hip abduction    Hip adduction    Hip internal rotation    Hip external rotation    Knee flexion 120 125  Knee extension    Ankle dorsiflexion    Ankle plantarflexion    Ankle inversion    Ankle eversion     (Blank rows = not tested)  LOWER EXTREMITY MMT:    MMT Right 05/09/23 Left 05/09/23  Hip flexion 4 4  Hip extension    Hip abduction    Hip adduction 5 5  Hip internal rotation    Hip external rotation    Knee flexion 5 5  Knee extension 5 5  Ankle dorsiflexion    Ankle plantarflexion    Ankle inversion    Ankle eversion     (Blank rows = not tested)  LUMBAR SPECIAL TESTS:  05/09/23 Slump test: Negative bilaterally  FUNCTIONAL TESTS:  05/09/23 5 times sit to stand: 17.8 seconds UE support  GAIT: 05/09/23 Distance walked: clinic distance Assistive device utilized: None Level of assistance: Complete Independence Comments: forward flexed at hips and head  TODAY'S TREATMENT:                                                                                                          DATE:    05/18/23  TherEx  Nustep L4x6 minutes BLEs only  SKTC 5x5-10 second holds  B Lumbar rotation stretch 5-10 seconds B PPT 12x3 second holds PPT + low range SLR x12 B QL stretch forward 3x30 seconds  Seated march + TA set  x15 B   05/09/23  Therex: HEP instruction/performance c cues for techniques, handout provided.  Trial set performed of each for comprehension and symptom assessment.  See below for exercise list  PATIENT EDUCATION:  Education details: HEP, POC, DN  Person educated: Patient Education method: Explanation, Demonstration, Verbal cues, and Handouts for HEP and DN Education comprehension: verbalized understanding, returned demonstration, and verbal cues required  HOME EXERCISE PROGRAM: Access Code: [email or web address] URL: [email or web address] Date: 05/09/2023 Prepared by: Narda Amber  Exercises - Supine Bridge  - 1-2 x daily - 7 x weekly - 2 sets - 10 reps - 5 seconds hold - Supine Lower Trunk Rotation  - 1-2 x daily - 7 x weekly - 3-5 reps - 10 seconds hold - Hooklying Single Knee to Chest Stretch  - 1-2 x daily - 7 x weekly - 3-5 sets - 10 seconds hold - Seated Hamstring Stretch  - 1-2 x daily - 7 x weekly - 3 reps - 20 seconds hold - Standing Lumbar Extension at Wall - Forearms  - 1-2 x daily - 7 x weekly - 5-10 reps - 10 seconds hold  ASSESSMENT:  CLINICAL IMPRESSION:  Pt arrives doing OK today had a bit of a flare up after doing some yardwork this past weekend. Worked on strength and lumbar/hip mobility today with good tolerance today. Encouraged good biomechanics and avoiding bending + twisting motions when working in his yard, also stressed importance of core strengthening/activation with all tasks and transitioning away from corset wear as able.   OBJECTIVE IMPAIRMENTS: decreased mobility, difficulty walking, decreased ROM, decreased strength, and pain.   ACTIVITY LIMITATIONS: lifting, bending, standing, and  stairs  PARTICIPATION LIMITATIONS: driving and community activity, sleeping  PERSONAL FACTORS: 3+ comorbidities: see pertinent history above  are also affecting patient's functional outcome.   REHAB POTENTIAL: Good  CLINICAL DECISION MAKING: Stable/uncomplicated  EVALUATION COMPLEXITY: Low   GOALS: Goals reviewed with patient? Yes  SHORT TERM GOALS: (target date for Short term goals are 3 weeks 06/01/23)  1. Patient will demonstrate independent use of home exercise program to maintain progress from in clinic treatments.  Goal status: New  LONG TERM GOALS: (target dates for all long term goals are 10 weeks  07/20/23 )   1. Patient will demonstrate/report pain at worst less than or equal to 2/10 to facilitate minimal limitation in daily activity secondary to pain symptoms.  Goal status: New   2. Patient will demonstrate independent use of home exercise program to facilitate ability to maintain/progress functional gains from skilled physical therapy services.  Goal status: New  3. Patient will demonstrate FOTO outcome > or = 68 % to indicate reduced disability due to condition.  Goal status: New   4. Patient will demonstrate lumbar extension 100 % WFL s symptoms to facilitate upright standing, walking posture at PLOF s limitation.  Goal status: New   5.  Pt will be able to lift 20# from floor to counter height using correct body mechanics with no pain.   Goal status: New   6.  Pt will improve his hamstring flexibility to >/= 60 deg bilaterally to help improve overall functional mobility.  Goal status: New   7. Pt will improve his bil trunk rotation to [facility] for return to golf.  Goal status: New     PLAN:  PT FREQUENCY: 1-2x/week  PT DURATION: 10 weeks  PLANNED INTERVENTIONS: Therapeutic exercises, Therapeutic activity, Neuro Muscular re-education, Balance training, Gait training, Patient/Family education, Joint mobilization, Stair training, DME instructions, Dry  Needling, Electrical stimulation, Cryotherapy, vasopneumatic device, Moist heat, Taping, Traction Ultrasound, Ionotophoresis 4mg /ml Dexamethasone, and aquatic therapy, Manual therapy.  All included unless contraindicated  PLAN FOR NEXT SESSION: Review HEP knowledge/results,core strengthening,  manual and modalities as needed  Consider DN, handout issued at eval.  Edu pt on using pillows for support in side lying sleeping       Nedra Hai, PT, DPT 05/18/23 12:27 PM   Referring diagnosis? M54.9 Treatment diagnosis? (if different than referring diagnosis) M54.9, M62.81, R26.2 What was this (referring dx) caused by? []  Surgery []  Fall [x]  Ongoing issue [x]  Arthritis []  Other: ____________  Laterality: []  Rt []  Lt []  Both  Check all possible CPT codes:  *CHOOSE 10 OR LESS*    []  97110 (Therapeutic Exercise)  []  92507 (SLP Treatment)  []  97112 (Neuro Re-ed)   []  92526 (Swallowing Treatment)   []  97116 (Gait Training)   []  16109 (Cognitive Training, 1st 15 minutes) []  97140 (Manual Therapy)   []  97130 (Cognitive Training, each add'l 15 minutes)  []  97164 (Re-evaluation)                              []  Other, List CPT Code ____________  []  97530 (Therapeutic Activities)     []  97535 (Self Care)   [x]  All codes above (97110 - 97535)  []  97012 (Mechanical Traction)  [x]  97014 (E-stim Unattended)  []  97032 (E-stim manual)  []  97033 (Ionto)  []  97035 (Ultrasound) [x]  97750 (Physical Performance Training) []  [id number] (Aquatic Therapy) []  97016 (Vasopneumatic Device) []  [id number] (Paraffin) []  97034 (Contrast Bath) []  97597 (Wound Care 1st 20 sq cm) []  97598 (Wound Care each add'l 20 sq cm) []  97760 (Orthotic Fabrication, Fitting, Training Initial) []  [id number] (Prosthetic Management and Training Initial) []  [id number] (Orthotic or Prosthetic Training/ Modification Subsequent)

## 2023-05-21 ENCOUNTER — Encounter: Payer: Self-pay | Admitting: Rehabilitative and Restorative Service Providers"

## 2023-05-21 ENCOUNTER — Telehealth: Payer: Self-pay | Admitting: Physical Therapy

## 2023-05-21 ENCOUNTER — Encounter: Payer: Medicare PPO | Admitting: Physical Therapy

## 2023-05-21 ENCOUNTER — Ambulatory Visit: Payer: Medicare PPO | Admitting: Rehabilitative and Restorative Service Providers"

## 2023-05-21 DIAGNOSIS — R262 Difficulty in walking, not elsewhere classified: Secondary | ICD-10-CM

## 2023-05-21 DIAGNOSIS — M6281 Muscle weakness (generalized): Secondary | ICD-10-CM

## 2023-05-21 DIAGNOSIS — M5459 Other low back pain: Secondary | ICD-10-CM | POA: Diagnosis not present

## 2023-05-21 NOTE — Therapy (Signed)
OUTPATIENT PHYSICAL THERAPY TREATMENT    Patient Name: Ernest Hughes. MRN: [id number] DOB:[birth year], 77 y.o., male Today's Date: 05/21/2023  END OF SESSION:  PT End of Session - 05/21/23 1549     Visit Number 3    Number of Visits 20    Date for PT Re-Evaluation 07/20/23    Authorization Type [employer]    Authorization Time Period -07/20/2023    Authorization - Visit Number 3    Authorization - Number of Visits 12    PT Start Time 1548    PT Stop Time 1627    PT Time Calculation (min) 39 min    Activity Tolerance Patient tolerated treatment well    Behavior During Therapy WFL for tasks assessed/performed               Past Medical History:  Diagnosis Date   Arthritis    Asthma    as a baby   BPH (benign prostatic hyperplasia)    Cancer (HCC)    PROSTATE--treated with radiation   Diverticulosis    GERD (gastroesophageal reflux disease)    Hyperlipidemia    Hypertension    Past Surgical History:  Procedure Laterality Date   BELPHAROPTOSIS REPAIR  12/2013   bilateral   cataract surgery Left 2019   COLONOSCOPY     Dr. Jarold Motto   Patient Active Problem List   Diagnosis Date Noted   Lipoma of back 09/13/2022   New onset type 2 diabetes mellitus (HCC) 09/13/2022   Benign prostatic hyperplasia with nocturia 08/29/2021   History of arthroscopic knee surgery 11/18/2020   Arthritis 05/10/2020   History of cataract surgery, unspecified laterality 04/28/2019   Erectile dysfunction 04/28/2019   History of prostate cancer 05/22/2011   GERD 02/02/2009   Hyperlipidemia associated with type 2 diabetes mellitus (HCC) 02/01/2009   Hypertension associated with diabetes (HCC) 02/01/2009   Diverticulosis of colon 02/01/2009    PCP: Ronnald Nian, MD   REFERRING PROVIDER: Ronnald Nian, MD   REFERRING DIAG:  Diagnosis  M54.9 (ICD-10-CM) - Musculoskeletal back pain    Rationale for Evaluation and Treatment: Rehabilitation  THERAPY DIAG:  Other low back  pain  Muscle weakness (generalized)  Difficulty in walking, not elsewhere classified  ONSET DATE: "a while"  SUBJECTIVE:  SUBJECTIVE STATEMENT: Pt indicated he has had some improvement at night.  Pt indicated he was going to get a firmer bed option.   PERTINENT HISTORY:  DM, cancer (prostate treated with radiation), GERD, BPH, Diverticulosis, arthritis, asthma, HTN, hyperlipidemia, left meniscus surgery  PAIN:  NPRS scale: no specific pain Pain location: Rt sided low back  Pain description: achy Aggravating factors: bending, prolonged standing, sitting Relieving factors: muscle relaxer, over the counter meds, walking  PRECAUTIONS: None  WEIGHT BEARING RESTRICTIONS: No  FALLS:  Has patient fallen in last 6 months? No  LIVING ENVIRONMENT: Lives with: lives with their family and lives with their spouse Lives in: House/apartment Stairs: 5 steps outside rail on both,  1 flight inside: rail on left Has following equipment at home: None  OCCUPATION: retired  PLOF: Independent  PATIENT GOALS: walk more get back to gym and playing golf  Next MD Visit: follow up as needed   OBJECTIVE:   DIAGNOSTIC FINDINGS: 12/14/2016 Scoliosis lumbar spine. Diffuse degenerative changes multilevel disc degeneration again noted. No change from prior exam. No acute bony abnormality identified. No evidence of fracture. Pelvic calcifications consistent with phleboliths.   IMPRESSION: Scoliosis lumbar spine. Diffuse degenerative change with multilevel disc degeneration again noted. No change from prior exam. No acute abnormality  identified.  PATIENT SURVEYS:  05/09/23: FOTO eval:    60%   SCREENING FOR RED FLAGS: Bowel or bladder incontinence: No Cauda equina syndrome: No  COGNITION: Overall cognitive status: WFL normal      SENSATION: WFL  MUSCLE LENGTH: Hamstrings: Right 58 deg; Left 45 deg    PALPATION: 05/09/23 TTP: Rt QL and bilateral lumbar paraspinals  LUMBAR ROM:   AROM 05/09/23 05/21/2023  Flexion 55 finger tips to above knees To ankles, no complaints noted.   Extension 15 75% WFL   Right lateral flexion 22 To knee joint no complaints.   Left lateral flexion 20 To lateral epicondyle of femur.   Right rotation Limited 50%   Left rotation Limited 25%    (Blank rows = not tested)  LOWER EXTREMITY ROM:      Right 05/09/23 Supine active Left 05/09/23 Supine active  Hip flexion 100 98  Hip extension    Hip abduction    Hip adduction    Hip internal rotation    Hip external rotation    Knee flexion 120 125  Knee extension    Ankle dorsiflexion    Ankle plantarflexion    Ankle inversion    Ankle eversion     (Blank rows = not tested)  LOWER EXTREMITY MMT:    MMT Right 05/09/23 Left 05/09/23  Hip flexion 4 4  Hip extension    Hip abduction    Hip adduction 5 5  Hip internal rotation    Hip external rotation    Knee flexion 5 5  Knee extension 5 5  Ankle dorsiflexion    Ankle plantarflexion    Ankle inversion    Ankle eversion     (Blank rows = not tested)  LUMBAR SPECIAL TESTS:  05/09/23 Slump test: Negative bilaterally  FUNCTIONAL TESTS:  05/09/23 5 times sit to stand: 17.8 seconds UE support  GAIT: 05/09/23 Distance walked: clinic distance Assistive device utilized: None Level of assistance: Complete Independence Comments: forward flexed at hips and head  TODAY'S TREATMENT:                                                                                   DATE: 10//02/2023 Therex: Lt sidelying regional rotation lumbar stretch 15 sec x 5 Supine bridge 2 x 10 SKC 15 sec x 5 bilateral Standing lumbar extension AROM x 5  Standing blue band rows 2 x 15 bilateral Standing blue band GH ext 2 x 15 bilateral Nustep lvl 6 8 mins UE/LE   Verbal cues for HEP review.   Manual: Percussive device to Rt QL in Lt sidelying for myofascial release.     TODAY'S TREATMENT:                                                                                   DATE: 05/18/23 TherEx Nustep L4x6 minutes BLEs only  SKTC 5x5-10 second holds  B Lumbar rotation stretch 5-10 seconds B PPT 12x3 second holds PPT + low range SLR x12 B QL stretch forward 3x30 seconds  Seated march + TA set  x15 B   TODAY'S TREATMENT:                                                                                   DATE:  05/09/23  Therex: HEP instruction/performance c cues for techniques, handout provided.  Trial set performed of each for comprehension and symptom assessment.  See below for exercise list  PATIENT EDUCATION:   Education details: HEP, POC, DN  Person educated: Patient Education method: Explanation, Demonstration, Verbal cues, and Handouts for HEP and DN Education comprehension: verbalized understanding, returned demonstration, and verbal cues required  HOME EXERCISE PROGRAM: Access Code: [email or web address] URL: [email or web address] Date: 05/09/2023 Prepared by: Chyrel Masson  Exercises - Supine Bridge  - 1-2 x daily - 7 x weekly - 2 sets - 10 reps - 5 seconds hold - Supine Lower Trunk Rotation  - 1-2 x daily - 7 x weekly - 3-5 reps - 10 seconds hold - Hooklying Single Knee to Chest Stretch  - 1-2 x daily - 7 x weekly - 3-5 sets - 10 seconds hold - Seated Hamstring Stretch  - 1-2 x daily - 7 x weekly - 3 reps - 20 seconds hold - Standing Lumbar  Extension at Wall - Forearms  - 1-2 x daily - 7 x weekly - 5-10 reps - 10 seconds hold - Sidelying Lumbar Rotation Stretch  - 1 x daily - 7 x weekly - 5 reps - 1 sets - 15 hold  ASSESSMENT:  CLINICAL  IMPRESSION: Lumbar mobility today showed pretty good  today with no specific pain complaints.  Mild tightness on Rt vs. Lt lumbar.  Use of percussive device in conservative myofascial release treatment today.  Added lumbar rotation stretch in side lying to progress mobility.   OBJECTIVE IMPAIRMENTS: decreased mobility, difficulty walking, decreased ROM, decreased strength, and pain.   ACTIVITY LIMITATIONS: lifting, bending, standing, and stairs  PARTICIPATION LIMITATIONS: driving and community activity, sleeping  PERSONAL FACTORS: 3+ comorbidities: see pertinent history above  are also affecting patient's functional outcome.   REHAB POTENTIAL: Good  CLINICAL DECISION MAKING: Stable/uncomplicated  EVALUATION COMPLEXITY: Low   GOALS: Goals reviewed with patient? Yes  SHORT TERM GOALS: (target date for Short term goals are 3 weeks 06/01/23)  1. Patient will demonstrate independent use of home exercise program to maintain progress from in clinic treatments.  Goal status:  on going 05/21/2023  LONG TERM GOALS: (target dates for all long term goals are 10 weeks  07/20/23 )   1. Patient will demonstrate/report pain at worst less than or equal to 2/10 to facilitate minimal limitation in daily activity secondary to pain symptoms.  Goal status: New   2. Patient will demonstrate independent use of home exercise program to facilitate ability to maintain/progress functional gains from skilled physical therapy services.  Goal status: New   3. Patient will demonstrate FOTO outcome > or = 68 % to indicate reduced disability due to condition.  Goal status: New   4. Patient will demonstrate lumbar extension 100 % WFL s symptoms to facilitate upright standing, walking posture at PLOF s  limitation.  Goal status: New   5.  Pt will be able to lift 20# from floor to counter height using correct body mechanics with no pain.   Goal status: New   6.  Pt will improve his hamstring flexibility to >/= 60 deg bilaterally to help improve overall functional mobility.  Goal status: New   7. Pt will improve his bil trunk rotation to [facility] for return to golf.  Goal status: New     PLAN:  PT FREQUENCY: 1-2x/week  PT DURATION: 10 weeks  PLANNED INTERVENTIONS: Therapeutic exercises, Therapeutic activity, Neuro Muscular re-education, Balance training, Gait training, Patient/Family education, Joint mobilization, Stair training, DME instructions, Dry Needling, Electrical stimulation, Cryotherapy, vasopneumatic device, Moist heat, Taping, Traction Ultrasound, Ionotophoresis 4mg /ml Dexamethasone, and aquatic therapy, Manual therapy.  All included unless contraindicated  PLAN FOR NEXT SESSION: Add sidelying lumbar stretch to HEP (medbridge was broken today).    Chyrel Masson, PT, DPT, OCS, ATC 05/21/23  4:23 PM       Referring diagnosis? M54.9 Treatment diagnosis? (if different than referring diagnosis) M54.9, M62.81, R26.2 What was this (referring dx) caused by? []  Surgery []  Fall [x]  Ongoing issue [x]  Arthritis []  Other: ____________  Laterality: []  Rt []  Lt []  Both  Check all possible CPT codes:  *CHOOSE 10 OR LESS*    []  97110 (Therapeutic Exercise)  []  92507 (SLP Treatment)  []  97112 (Neuro Re-ed)   []  92526 (Swallowing Treatment)   []  97116 (Gait Training)   []  [id number] (Cognitive Training, 1st 15 minutes) []  43329 (Manual Therapy)   []  97130 (Cognitive Training, each add'l 15 minutes)  []  97164 (Re-evaluation)                              []  Other, List CPT Code ____________  []  97530 (Therapeutic Activities)     []  97535 (  Self Care)   [x]  All codes above (97110 - 97535)  []  97012 (Mechanical Traction)  [x]  97014 (E-stim Unattended)  []  97032 (E-stim  manual)  []  97033 (Ionto)  []  97035 (Ultrasound) [x]  97750 (Physical Performance Training) []  [id number] (Aquatic Therapy) []  97016 (Vasopneumatic Device) []  [id number] (Paraffin) []  97034 (Contrast Bath) []  97597 (Wound Care 1st 20 sq cm) []  97598 (Wound Care each add'l 20 sq cm) []  97760 (Orthotic Fabrication, Fitting, Training Initial) []  [id number] (Prosthetic Management and Training Initial) []  [id number] (Orthotic or Prosthetic Training/ Modification Subsequent)

## 2023-05-21 NOTE — Telephone Encounter (Signed)
I called pt to follow up after he missed his appointment at 1:00. Pt stating he got his times mixed up and thought his appointment was 1:45. Pt was offered an opening appointment at 4:00 today and he accepted.  Narda Amber, PT, MPT 05/21/23 1:47 PM

## 2023-05-22 ENCOUNTER — Encounter: Payer: Medicare PPO | Admitting: Physical Therapy

## 2023-05-22 DIAGNOSIS — S0990XA Unspecified injury of head, initial encounter: Secondary | ICD-10-CM | POA: Diagnosis not present

## 2023-05-22 DIAGNOSIS — E860 Dehydration: Secondary | ICD-10-CM | POA: Diagnosis not present

## 2023-05-22 DIAGNOSIS — R55 Syncope and collapse: Secondary | ICD-10-CM | POA: Diagnosis not present

## 2023-05-22 DIAGNOSIS — Z743 Need for continuous supervision: Secondary | ICD-10-CM | POA: Diagnosis not present

## 2023-05-23 ENCOUNTER — Encounter: Payer: Medicare PPO | Admitting: Physical Therapy

## 2023-05-24 ENCOUNTER — Ambulatory Visit: Payer: Medicare PPO | Admitting: Physical Therapy

## 2023-05-24 ENCOUNTER — Encounter: Payer: Self-pay | Admitting: Physical Therapy

## 2023-05-24 DIAGNOSIS — M6281 Muscle weakness (generalized): Secondary | ICD-10-CM

## 2023-05-24 DIAGNOSIS — M5459 Other low back pain: Secondary | ICD-10-CM

## 2023-05-24 DIAGNOSIS — R262 Difficulty in walking, not elsewhere classified: Secondary | ICD-10-CM | POA: Diagnosis not present

## 2023-05-24 NOTE — Therapy (Signed)
OUTPATIENT PHYSICAL THERAPY TREATMENT    Patient Name: Ernest Hughes. MRN: [id number] DOB:1946/03/08, 77 y.o., male Today's Date: 05/24/2023  END OF SESSION:  PT End of Session - 05/24/23 1344     Visit Number 4    Number of Visits 20    Date for PT Re-Evaluation 07/20/23    Authorization Type [employer]    Authorization Time Period -07/20/2023    Authorization - Number of Visits 12    PT Start Time 1345    PT Stop Time 1425    PT Time Calculation (min) 40 min    Activity Tolerance Patient tolerated treatment well    Behavior During Therapy WFL for tasks assessed/performed                Past Medical History:  Diagnosis Date   Arthritis    Asthma    as a baby   BPH (benign prostatic hyperplasia)    Cancer (HCC)    PROSTATE--treated with radiation   Diverticulosis    GERD (gastroesophageal reflux disease)    Hyperlipidemia    Hypertension    Past Surgical History:  Procedure Laterality Date   BELPHAROPTOSIS REPAIR  12/2013   bilateral   cataract surgery Left 2019   COLONOSCOPY     Dr. Jarold Motto   Patient Active Problem List   Diagnosis Date Noted   Lipoma of back 09/13/2022   New onset type 2 diabetes mellitus (HCC) 09/13/2022   Benign prostatic hyperplasia with nocturia 08/29/2021   History of arthroscopic knee surgery 11/18/2020   Arthritis 05/10/2020   History of cataract surgery, unspecified laterality 04/28/2019   Erectile dysfunction 04/28/2019   History of prostate cancer 05/22/2011   GERD 02/02/2009   Hyperlipidemia associated with type 2 diabetes mellitus (HCC) 02/01/2009   Hypertension associated with diabetes (HCC) 02/01/2009   Diverticulosis of colon 02/01/2009    PCP: Ronnald Nian, MD   REFERRING PROVIDER: Ronnald Nian, MD   REFERRING DIAG:  Diagnosis  M54.9 (ICD-10-CM) - Musculoskeletal back pain    Rationale for Evaluation and Treatment: Rehabilitation  THERAPY DIAG:  Other low back pain  Muscle weakness  (generalized)  Difficulty in walking, not elsewhere classified  ONSET DATE: "a while"  SUBJECTIVE:  SUBJECTIVE STATEMENT: Passed out the other day when down in [place], [place] cutting down some trees.  Initial ED reports were due to dehydration; didn't eat or drink anything on the way down.  Only having some mild headaches since then.   PERTINENT HISTORY:  DM, cancer (prostate treated with radiation), GERD, BPH, Diverticulosis, arthritis, asthma, HTN, hyperlipidemia, left meniscus surgery  PAIN:  NPRS scale: 0/10 Pain location: Rt sided low back  Pain description: achy Aggravating factors: bending, prolonged standing, sitting Relieving factors: muscle relaxer, over the counter meds, walking  PRECAUTIONS: None  WEIGHT BEARING RESTRICTIONS: No  FALLS:  Has patient fallen in last 6 months? No  LIVING ENVIRONMENT: Lives with: lives with their family and lives with their spouse Lives in: House/apartment Stairs: 5 steps outside rail on both,  1 flight inside: rail on left Has following equipment at home: None  OCCUPATION: retired  PLOF: Independent  PATIENT GOALS: walk more get back to gym and playing golf  Next MD Visit: follow up as needed   OBJECTIVE:   DIAGNOSTIC FINDINGS: 12/14/2016 Scoliosis lumbar spine. Diffuse degenerative changes multilevel disc degeneration again noted. No change from prior exam. No acute bony abnormality identified. No evidence of fracture. Pelvic calcifications consistent with phleboliths.   IMPRESSION: Scoliosis lumbar spine. Diffuse degenerative change with multilevel disc  degeneration again noted. No change from prior exam. No acute abnormality identified.  PATIENT SURVEYS:  05/09/23: FOTO eval:    60%   SCREENING FOR RED FLAGS: Bowel or bladder incontinence: No Cauda equina syndrome: No  COGNITION: Overall cognitive status: WFL normal      SENSATION: WFL  MUSCLE LENGTH: Hamstrings: Right 58 deg; Left 45 deg    PALPATION: 05/09/23 TTP: Rt QL and bilateral lumbar paraspinals  LUMBAR ROM:   AROM 05/09/23 05/21/2023  Flexion 55 finger tips to above knees To ankles, no complaints noted.   Extension 15 75% WFL   Right lateral flexion 22 To knee joint no complaints.   Left lateral flexion 20 To lateral epicondyle of femur.   Right rotation Limited 50%   Left rotation Limited 25%    (Blank rows = not tested)  LOWER EXTREMITY ROM:      Right 05/09/23 Supine active Left 05/09/23 Supine active  Hip flexion 100 98  Hip extension    Hip abduction    Hip adduction    Hip internal rotation    Hip external rotation    Knee flexion 120 125  Knee extension    Ankle dorsiflexion    Ankle plantarflexion    Ankle inversion    Ankle eversion     (Blank rows = not tested)  LOWER EXTREMITY MMT:    MMT Right 05/09/23 Left 05/09/23  Hip flexion 4 4  Hip extension    Hip abduction    Hip adduction 5 5  Hip internal rotation    Hip external rotation    Knee flexion 5 5  Knee extension 5 5  Ankle dorsiflexion    Ankle plantarflexion    Ankle inversion    Ankle eversion     (Blank rows = not tested)  LUMBAR SPECIAL TESTS:  05/09/23 Slump test: Negative bilaterally  FUNCTIONAL TESTS:  05/09/23 5 times sit to stand: 17.8 seconds UE support  GAIT: 05/09/23 Distance walked: clinic distance Assistive device utilized: None Level of assistance: Complete Independence Comments: forward flexed at hips and head  TODAY'S TREATMENT: DATE:  05/24/23 TherEx Single knee to chest 5x15 sec bil Bridges 2x10; 5 sec hold Sidelying book openers 10 x 5 sec hold Sit to/from stand x 10 reps Rows L4 2x10 NuStep L6 x 8 min; UE/LE   10//02/2023 Therex: Lt sidelying regional rotation lumbar stretch 15 sec x 5 Supine bridge 2 x 10 SKC 15 sec x 5 bilateral Standing lumbar extension AROM x 5  Standing blue band rows 2 x 15 bilateral Standing blue band GH ext 2 x 15 bilateral Nustep lvl 6 8 mins UE/LE   Verbal cues for HEP review.   Manual: Percussive device to Rt QL in Lt sidelying for myofascial release.     05/18/23 TherEx Nustep L4x6 minutes BLEs only  SKTC 5x5-10 second holds  B Lumbar rotation stretch 5-10 seconds B PPT 12x3 second holds PPT + low range SLR x12 B QL stretch forward 3x30 seconds  Seated march + TA set  x15 B   05/09/23  Therex: HEP instruction/performance c cues for techniques, handout provided.  Trial set performed of each for comprehension and symptom assessment.  See below for exercise list  PATIENT EDUCATION:   Education details: HEP, POC, DN  Person educated: Patient Education method: Explanation, Demonstration, Verbal cues, and Handouts for HEP and DN Education comprehension: verbalized understanding, returned demonstration, and verbal cues required  HOME EXERCISE PROGRAM: Access Code: [email or web address] URL: [email or web address] Date: 05/09/2023 Prepared by: Chyrel Masson  Exercises - Supine Bridge  - 1-2 x daily - 7 x weekly - 2 sets - 10 reps - 5 seconds hold - Supine Lower Trunk Rotation  - 1-2 x daily - 7 x weekly - 3-5 reps - 10 seconds hold - Hooklying Single Knee to Chest Stretch  - 1-2 x daily - 7 x weekly - 3-5 sets - 10 seconds hold - Seated Hamstring Stretch  - 1-2 x daily - 7 x weekly - 3 reps - 20 seconds hold - Standing Lumbar Extension at Wall - Forearms  - 1-2  x daily - 7 x weekly - 5-10 reps - 10 seconds hold - Sidelying Lumbar Rotation Stretch  - 1 x daily - 7 x weekly - 5 reps - 1 sets - 15 hold  ASSESSMENT:  CLINICAL IMPRESSION: Pt without pain today and overall tolerated session well.  Pt will continue to benefit from PT to maximize function.  OBJECTIVE IMPAIRMENTS: decreased mobility, difficulty walking, decreased ROM, decreased strength, and pain.   ACTIVITY LIMITATIONS: lifting, bending, standing, and stairs  PARTICIPATION LIMITATIONS: driving and community activity, sleeping  PERSONAL FACTORS: 3+ comorbidities: see pertinent history above  are also affecting patient's functional outcome.   REHAB POTENTIAL: Good  CLINICAL DECISION MAKING: Stable/uncomplicated  EVALUATION COMPLEXITY: Low   GOALS: Goals reviewed with patient? Yes  SHORT TERM GOALS: (target date for Short term goals are 3 weeks 06/01/23)  1. Patient will demonstrate independent use of home exercise program to maintain progress from in clinic treatments.  Goal status:  on going 05/21/2023  LONG TERM GOALS: (target dates for all long term goals are 10 weeks  07/20/23 )   1. Patient will demonstrate/report pain at worst less than or equal to 2/10 to facilitate minimal limitation in daily activity secondary to pain symptoms.  Goal status: New   2. Patient will demonstrate independent use of home exercise program to facilitate ability to maintain/progress functional gains from skilled physical therapy services.  Goal status: New   3. Patient will demonstrate FOTO outcome > or =  68 % to indicate reduced disability due to condition.  Goal status: New   4. Patient will demonstrate lumbar extension 100 % WFL s symptoms to facilitate upright standing, walking posture at PLOF s limitation.  Goal status: New   5.  Pt will be able to lift 20# from floor to counter height using correct body mechanics with no pain.   Goal status: New   6.  Pt will improve his  hamstring flexibility to >/= 60 deg bilaterally to help improve overall functional mobility.  Goal status: New   7. Pt will improve his bil trunk rotation to [facility] for return to golf.  Goal status: New     PLAN:  PT FREQUENCY: 1-2x/week  PT DURATION: 10 weeks  PLANNED INTERVENTIONS: Therapeutic exercises, Therapeutic activity, Neuro Muscular re-education, Balance training, Gait training, Patient/Family education, Joint mobilization, Stair training, DME instructions, Dry Needling, Electrical stimulation, Cryotherapy, vasopneumatic device, Moist heat, Taping, Traction Ultrasound, Ionotophoresis 4mg /ml Dexamethasone, and aquatic therapy, Manual therapy.  All included unless contraindicated  PLAN FOR NEXT SESSION: continue flexibility, hip/core strengthening, manual/modalities PRN   Clarita Crane, PT, DPT 05/24/23 2:27 PM

## 2023-05-25 ENCOUNTER — Encounter: Payer: Medicare PPO | Admitting: Physical Therapy

## 2023-05-28 ENCOUNTER — Encounter: Payer: Self-pay | Admitting: Physical Therapy

## 2023-05-28 ENCOUNTER — Ambulatory Visit: Payer: Medicare PPO | Admitting: Physical Therapy

## 2023-05-28 DIAGNOSIS — M5459 Other low back pain: Secondary | ICD-10-CM

## 2023-05-28 DIAGNOSIS — M6281 Muscle weakness (generalized): Secondary | ICD-10-CM

## 2023-05-28 DIAGNOSIS — R262 Difficulty in walking, not elsewhere classified: Secondary | ICD-10-CM

## 2023-05-28 NOTE — Therapy (Signed)
OUTPATIENT PHYSICAL THERAPY TREATMENT    Patient Name: Ernest Hughes. MRN: [id number] DOB:Mar 28, 1946, 77 y.o., male Today's Date: 05/28/2023  END OF SESSION:  PT End of Session - 05/28/23 1138     Visit Number 5    Number of Visits 20    Date for PT Re-Evaluation 07/20/23    Authorization Type [employer]    Authorization Time Period -07/20/2023    Authorization - Visit Number 4    Authorization - Number of Visits 12    PT Start Time 1055    PT Stop Time 1137    PT Time Calculation (min) 42 min    Activity Tolerance Patient tolerated treatment well    Behavior During Therapy WFL for tasks assessed/performed                 Past Medical History:  Diagnosis Date   Arthritis    Asthma    as a baby   BPH (benign prostatic hyperplasia)    Cancer (HCC)    PROSTATE--treated with radiation   Diverticulosis    GERD (gastroesophageal reflux disease)    Hyperlipidemia    Hypertension    Past Surgical History:  Procedure Laterality Date   BELPHAROPTOSIS REPAIR  12/2013   bilateral   cataract surgery Left 2019   COLONOSCOPY     Dr. Jarold Motto   Patient Active Problem List   Diagnosis Date Noted   Lipoma of back 09/13/2022   New onset type 2 diabetes mellitus (HCC) 09/13/2022   Benign prostatic hyperplasia with nocturia 08/29/2021   History of arthroscopic knee surgery 11/18/2020   Arthritis 05/10/2020   History of cataract surgery, unspecified laterality 04/28/2019   Erectile dysfunction 04/28/2019   History of prostate cancer 05/22/2011   GERD 02/02/2009   Hyperlipidemia associated with type 2 diabetes mellitus (HCC) 02/01/2009   Hypertension associated with diabetes (HCC) 02/01/2009   Diverticulosis of colon 02/01/2009    PCP: Ronnald Nian, MD   REFERRING PROVIDER: Ronnald Nian, MD   REFERRING DIAG:  Diagnosis  M54.9 (ICD-10-CM) - Musculoskeletal back pain    Rationale for Evaluation and Treatment: Rehabilitation  THERAPY DIAG:  Other low  back pain  Muscle weakness (generalized)  Difficulty in walking, not elsewhere classified  ONSET DATE: "a while"  SUBJECTIVE:  SUBJECTIVE STATEMENT: Pt stating no more syncope episodes. Pt stating he has been staying hydrated. Pt also reporting he feels like the stretches are helping and he is able to sleep better.    PERTINENT HISTORY:  DM, cancer (prostate treated with radiation), GERD, BPH, Diverticulosis, arthritis, asthma, HTN, hyperlipidemia, left meniscus surgery  PAIN:  NPRS scale: 4/10 Pain location: Rt sided low back  Pain description: achy Aggravating factors: bending, prolonged standing, sitting Relieving factors: muscle relaxer, over the counter meds, walking  PRECAUTIONS: None  WEIGHT BEARING RESTRICTIONS: No  FALLS:  Has patient fallen in last 6 months? No  LIVING ENVIRONMENT: Lives with: lives with their family and lives with their spouse Lives in: House/apartment Stairs: 5 steps outside rail on both,  1 flight inside: rail on left Has following equipment at home: None  OCCUPATION: retired  PLOF: Independent  PATIENT GOALS: walk more get back to gym and playing golf  Next MD Visit: follow up as needed   OBJECTIVE:   DIAGNOSTIC FINDINGS: 12/14/2016 Scoliosis lumbar spine. Diffuse degenerative changes multilevel disc degeneration again noted. No change from prior exam. No acute bony abnormality identified. No evidence of fracture. Pelvic calcifications consistent with phleboliths.   IMPRESSION: Scoliosis lumbar spine. Diffuse degenerative change with multilevel disc degeneration again  noted. No change from prior exam. No acute abnormality identified.  PATIENT SURVEYS:  05/09/23: FOTO eval:    60%   SCREENING FOR RED FLAGS: Bowel or bladder incontinence: No Cauda equina syndrome: No  COGNITION: Overall cognitive status: WFL normal      SENSATION: WFL  MUSCLE LENGTH: Hamstrings: Right 58 deg; Left 45 deg    PALPATION: 05/09/23 TTP: Rt QL and bilateral lumbar paraspinals  LUMBAR ROM:   AROM 05/09/23 05/21/2023  Flexion 55 finger tips to above knees To ankles, no complaints noted.   Extension 15 75% WFL   Right lateral flexion 22 To knee joint no complaints.   Left lateral flexion 20 To lateral epicondyle of femur.   Right rotation Limited 50%   Left rotation Limited 25%    (Blank rows = not tested)  LOWER EXTREMITY ROM:      Right 05/09/23 Supine active Left 05/09/23 Supine active Rt / Left 05/28/23 Supine  active  Hip flexion 100 98 110 / 106  Hip extension     Hip abduction     Hip adduction     Hip internal rotation     Hip external rotation     Knee flexion 120 125   Knee extension     Ankle dorsiflexion     Ankle plantarflexion     Ankle inversion     Ankle eversion      (Blank rows = not tested)  LOWER EXTREMITY MMT:    MMT Right 05/09/23 Left 05/09/23  Hip flexion 4 4  Hip extension    Hip abduction    Hip adduction 5 5  Hip internal rotation    Hip external rotation    Knee flexion 5 5  Knee extension 5 5  Ankle dorsiflexion    Ankle plantarflexion    Ankle inversion    Ankle eversion     (Blank rows = not tested)  LUMBAR SPECIAL TESTS:  05/09/23 Slump test: Negative bilaterally  FUNCTIONAL TESTS:  05/09/23 5 times sit to stand: 17.8 seconds UE support  05/28/23:  5 times sit to stand: 10.56 seconds no UE support  GAIT: 05/09/23 Distance walked: clinic distance Assistive device utilized: None Level of assistance:  Complete Independence Comments: forward flexed at hips and head                                                                                                                                                                                                                   TODAY'S TREATMENT: DATE:  05/28/23 TherEx Recumbent bike: level 4 x 6 minutes Standing hip abduction: 2 x 10  Standing hip extension: 2 x 10  Standing trunk extension x 5 holding 10 sec Single knee to chest 5x15 sec bil Bridges 2 x10: 5 sec hold Sidelying book openers 10 x 5 sec hold Supine: glute stretch: x 2 holding 20 sec bil LE Sit to stand: 5 times (17.2 seconds no UE support), (10.56 seconds no UE support)     05/24/23 TherEx Single knee to chest 5x15 sec bil Bridges 2x10; 5 sec hold Sidelying book openers 10 x 5 sec hold Sit to/from stand x 10 reps Rows L4 2x10 NuStep L6 x 8 min; UE/LE   10//02/2023 Therex: Lt sidelying regional rotation lumbar stretch 15 sec x 5 Supine bridge 2 x 10 SKC 15 sec x 5 bilateral Standing lumbar extension AROM x 5  Standing blue band rows 2 x 15 bilateral Standing blue band GH ext 2 x 15 bilateral Nustep lvl 6 8 mins UE/LE   Verbal cues for HEP review.   Manual: Percussive device to Rt QL in Lt sidelying for myofascial release.     05/18/23 TherEx Nustep L4x6 minutes BLEs only  SKTC 5x5-10 second holds  B Lumbar rotation stretch 5-10 seconds B PPT 12x3 second holds PPT + low range SLR x12 B QL stretch forward 3x30 seconds  Seated march + TA set  x15 B     PATIENT EDUCATION:   Education details: HEP, POC, DN  Person educated: Patient Education method: Explanation, Demonstration, Verbal cues, and Handouts for HEP and DN Education comprehension: verbalized understanding, returned demonstration, and verbal cues required  HOME EXERCISE PROGRAM: Access Code: [email or web address] URL: [email or web address] Date: 05/09/2023 Prepared by: Chyrel Masson  Exercises - Supine Bridge  - 1-2 x daily - 7 x weekly - 2 sets - 10 reps - 5 seconds  hold - Supine Lower Trunk Rotation  - 1-2 x daily - 7 x weekly - 3-5 reps - 10 seconds hold - Hooklying Single Knee to Chest Stretch  - 1-2 x daily - 7 x weekly - 3-5 sets - 10 seconds hold - Seated Hamstring Stretch  - 1-2 x daily - 7  x weekly - 3 reps - 20 seconds hold - Standing Lumbar Extension at Wall - Forearms  - 1-2 x daily - 7 x weekly - 5-10 reps - 10 seconds hold - Sidelying Lumbar Rotation Stretch  - 1 x daily - 7 x weekly - 5 reps - 1 sets - 15 hold  ASSESSMENT:  CLINICAL IMPRESSION: Pt arriving reporting 4/10 pain in his hip today. Pt stating his sleeping is getting better and feels the stretches are helping.  Pt has improved his 5 time sit to stand to 10.56 seconds. Pt is continuing to make progress with therapy with improvements in bil hip flexion ROM. Recommend continued skilled PT interventions.       OBJECTIVE IMPAIRMENTS: decreased mobility, difficulty walking, decreased ROM, decreased strength, and pain.   ACTIVITY LIMITATIONS: lifting, bending, standing, and stairs  PARTICIPATION LIMITATIONS: driving and community activity, sleeping  PERSONAL FACTORS: 3+ comorbidities: see pertinent history above  are also affecting patient's functional outcome.   REHAB POTENTIAL: Good  CLINICAL DECISION MAKING: Stable/uncomplicated  EVALUATION COMPLEXITY: Low   GOALS: Goals reviewed with patient? Yes  SHORT TERM GOALS: (target date for Short term goals are 3 weeks 06/01/23)  1. Patient will demonstrate independent use of home exercise program to maintain progress from in clinic treatments.  Goal status:  on going 05/21/2023  LONG TERM GOALS: (target dates for all long term goals are 10 weeks  07/20/23 )   1. Patient will demonstrate/report pain at worst less than or equal to 2/10 to facilitate minimal limitation in daily activity secondary to pain symptoms.  Goal status: New   2. Patient will demonstrate independent use of home exercise program to facilitate ability  to maintain/progress functional gains from skilled physical therapy services.  Goal status: New   3. Patient will demonstrate FOTO outcome > or = 68 % to indicate reduced disability due to condition.  Goal status: New   4. Patient will demonstrate lumbar extension 100 % WFL s symptoms to facilitate upright standing, walking posture at PLOF s limitation.  Goal status: New   5.  Pt will be able to lift 20# from floor to counter height using correct body mechanics with no pain.   Goal status: New   6.  Pt will improve his hamstring flexibility to >/= 60 deg bilaterally to help improve overall functional mobility.  Goal status: New   7. Pt will improve his bil trunk rotation to [facility] for return to golf.  Goal status: New     PLAN:  PT FREQUENCY: 1-2x/week  PT DURATION: 10 weeks  PLANNED INTERVENTIONS: Therapeutic exercises, Therapeutic activity, Neuro Muscular re-education, Balance training, Gait training, Patient/Family education, Joint mobilization, Stair training, DME instructions, Dry Needling, Electrical stimulation, Cryotherapy, vasopneumatic device, Moist heat, Taping, Traction Ultrasound, Ionotophoresis 4mg /ml Dexamethasone, and aquatic therapy, Manual therapy.  All included unless contraindicated  PLAN FOR NEXT SESSION: continue flexibility, hip/core strengthening, manual/modalities PRN   Narda Amber, PT, MPT 05/28/23 11:40 AM   05/28/23 11:40 AM

## 2023-05-29 ENCOUNTER — Ambulatory Visit: Payer: Medicare PPO | Admitting: Family Medicine

## 2023-05-29 ENCOUNTER — Encounter: Payer: Self-pay | Admitting: Family Medicine

## 2023-05-29 VITALS — BP 136/80 | HR 90 | Ht 69.0 in | Wt 200.0 lb

## 2023-05-29 DIAGNOSIS — S0990XA Unspecified injury of head, initial encounter: Secondary | ICD-10-CM

## 2023-05-29 NOTE — Progress Notes (Signed)
Subjective:    Patient ID: Ernest Hughes., male    DOB: 1945-12-19, 77 y.o.   MRN: [id number]  HPI He is here for a recheck after having a syncopal episode, falling and hitting the occipital area of his head.  He was seen in an emergency room.  The emergency room record was reviewed.  He did have blood work as well as a CT scan, all of which was negative.  It is felt that he had become dehydrated contributing to his syncopal episode.  Presently he is having no headache, blurred or double vision, nausea vomiting   Review of Systems     Objective:    Physical Exam Alert and oriented x 3.  EOMI.  Moves all extremities. The emergency room record including lab and x-rays was reviewed.      Assessment & Plan:  Traumatic injury of head, initial encounter I explained that at this time the main issue is to be concerned about worsening headache, vomiting, loss of orientation.  He can certainly take Tylenol for various aches and pains.  Cautioned him on moving more slowly from 1 position to another to avoid postural changes.

## 2023-05-29 NOTE — Patient Instructions (Signed)
The issues are increasing headache nausea vomiting and loss of orientation .  Keep yourself hydrated and make sure you go from 1 position to another slowly

## 2023-05-30 ENCOUNTER — Encounter: Payer: Self-pay | Admitting: Physical Therapy

## 2023-05-30 ENCOUNTER — Ambulatory Visit: Payer: Medicare PPO | Admitting: Physical Therapy

## 2023-05-30 DIAGNOSIS — M6281 Muscle weakness (generalized): Secondary | ICD-10-CM | POA: Diagnosis not present

## 2023-05-30 DIAGNOSIS — M5459 Other low back pain: Secondary | ICD-10-CM | POA: Diagnosis not present

## 2023-05-30 DIAGNOSIS — R262 Difficulty in walking, not elsewhere classified: Secondary | ICD-10-CM | POA: Diagnosis not present

## 2023-05-30 NOTE — Therapy (Signed)
OUTPATIENT PHYSICAL THERAPY TREATMENT    Patient Name: Ernest Hughes. MRN: [id number] DOB:04-04-1946, 77 y.o., male Today's Date: 05/30/2023  END OF SESSION:  PT End of Session - 05/30/23 1133     Visit Number 6    Number of Visits 20    Date for PT Re-Evaluation 07/20/23    Authorization Type [employer]    Authorization Time Period -07/20/2023    Authorization - Visit Number 5    Authorization - Number of Visits 12    PT Start Time 1100    PT Stop Time 1140    PT Time Calculation (min) 40 min    Activity Tolerance Patient tolerated treatment well    Behavior During Therapy WFL for tasks assessed/performed                  Past Medical History:  Diagnosis Date   Arthritis    Asthma    as a baby   BPH (benign prostatic hyperplasia)    Cancer (HCC)    PROSTATE--treated with radiation   Diverticulosis    GERD (gastroesophageal reflux disease)    Hyperlipidemia    Hypertension    Past Surgical History:  Procedure Laterality Date   BELPHAROPTOSIS REPAIR  12/2013   bilateral   cataract surgery Left 2019   COLONOSCOPY     Dr. Jarold Motto   Patient Active Problem List   Diagnosis Date Noted   Lipoma of back 09/13/2022   New onset type 2 diabetes mellitus (HCC) 09/13/2022   Benign prostatic hyperplasia with nocturia 08/29/2021   History of arthroscopic knee surgery 11/18/2020   Arthritis 05/10/2020   History of cataract surgery, unspecified laterality 04/28/2019   Erectile dysfunction 04/28/2019   History of prostate cancer 05/22/2011   GERD 02/02/2009   Hyperlipidemia associated with type 2 diabetes mellitus (HCC) 02/01/2009   Hypertension associated with diabetes (HCC) 02/01/2009   Diverticulosis of colon 02/01/2009    PCP: Ronnald Nian, MD   REFERRING PROVIDER: Ronnald Nian, MD   REFERRING DIAG:  Diagnosis  M54.9 (ICD-10-CM) - Musculoskeletal back pain    Rationale for Evaluation and Treatment: Rehabilitation  THERAPY DIAG:  Other low  back pain  Muscle weakness (generalized)  Difficulty in walking, not elsewhere classified  ONSET DATE: "a while"  SUBJECTIVE:  SUBJECTIVE STATEMENT: Pt reports he has been showing progress but last night was a tough night.  Pt reporting he has been trying to wean off any pain meds. Pt stating using his chain saw last Monday. Pt reporting clean bill of health following his fall due to dehydration.   PERTINENT HISTORY:  DM, cancer (prostate treated with radiation), GERD, BPH, Diverticulosis, arthritis, asthma, HTN, hyperlipidemia, left meniscus surgery  PAIN:  NPRS scale: 6/10 Pain location: Rt sided low back  Pain description: achy Aggravating factors: bending, prolonged standing, sitting Relieving factors: muscle relaxer, over the counter meds, walking  PRECAUTIONS: None  WEIGHT BEARING RESTRICTIONS: No  FALLS:  Has patient fallen in last 6 months? No  LIVING ENVIRONMENT: Lives with: lives with their family and lives with their spouse Lives in: House/apartment Stairs: 5 steps outside rail on both,  1 flight inside: rail on left Has following equipment at home: None  OCCUPATION: retired  PLOF: Independent  PATIENT GOALS: walk more get back to gym and playing golf  Next MD Visit: follow up as needed   OBJECTIVE:   DIAGNOSTIC FINDINGS: 12/14/2016 Scoliosis lumbar spine. Diffuse degenerative changes multilevel disc degeneration again noted. No change from prior exam. No acute bony abnormality identified. No evidence of fracture. Pelvic calcifications consistent with phleboliths.   IMPRESSION: Scoliosis  lumbar spine. Diffuse degenerative change with multilevel disc degeneration again noted. No change from prior exam. No acute abnormality identified.  PATIENT SURVEYS:  05/09/23: FOTO eval:    60%   SCREENING FOR RED FLAGS: Bowel or bladder incontinence: No Cauda equina syndrome: No  COGNITION: Overall cognitive status: WFL normal      SENSATION: WFL  MUSCLE LENGTH: Hamstrings: Right 58 deg; Left 45 deg    PALPATION: 05/09/23 TTP: Rt QL and bilateral lumbar paraspinals  LUMBAR ROM:   AROM 05/09/23 05/21/2023  Flexion 55 finger tips to above knees To ankles, no complaints noted.   Extension 15 75% WFL   Right lateral flexion 22 To knee joint no complaints.   Left lateral flexion 20 To lateral epicondyle of femur.   Right rotation Limited 50%   Left rotation Limited 25%    (Blank rows = not tested)  LOWER EXTREMITY ROM:      Right 05/09/23 Supine active Left 05/09/23 Supine active Rt / Left 05/28/23 Supine  active  Hip flexion 100 98 110 / 106  Hip extension     Hip abduction     Hip adduction     Hip internal rotation     Hip external rotation     Knee flexion 120 125   Knee extension     Ankle dorsiflexion     Ankle plantarflexion     Ankle inversion     Ankle eversion      (Blank rows = not tested)  LOWER EXTREMITY MMT:    MMT Right 05/09/23 Left 05/09/23  Hip flexion 4 4  Hip extension    Hip abduction    Hip adduction 5 5  Hip internal rotation    Hip external rotation    Knee flexion 5 5  Knee extension 5 5  Ankle dorsiflexion    Ankle plantarflexion    Ankle inversion    Ankle eversion     (Blank rows = not tested)  LUMBAR SPECIAL TESTS:  05/09/23 Slump test: Negative bilaterally  FUNCTIONAL TESTS:  05/09/23 5 times sit to stand: 17.8 seconds UE support  05/28/23:  5 times sit to stand: 10.56 seconds no  UE support  GAIT: 05/09/23 Distance walked: clinic distance Assistive device utilized: None Level of assistance: Complete  Independence Comments: forward flexed at hips and head                                                                                                                                                                                                                  TODAY'S TREATMENT: DATE:  05/30/23 TherEx Nustep: level 5 x 8 minutes  Seated trunk flexion stretch over green physio-ball x 8, holding end range x 3-5 sec Seated QL stretch over green physio-ball x 3 to each side holding 15 sec Leg Press: 75# bil LE x 20 with slow eccentrics Standing golf swing trunk rotation: x 5 to each side using golf club around back  Standing hip extension:  x 15 bil  Standing trunk extension x 10  holding 5 sec Seated hamstring stretch: x 2 bil LE holding 30 sec Sidelying book openers 10 x 5 sec hold Manual Percussion to Rt QL  STM and active trigger point release     05/28/23 TherEx Recumbent bike: level 4 x 6 minutes Standing hip abduction: 2 x 10  Standing hip extension: 2 x 10  Standing trunk extension x 5 holding 10 sec Single knee to chest 5x15 sec bil Bridges 2 x10: 5 sec hold Sidelying book openers 10 x 5 sec hold Supine: glute stretch: x 2 holding 20 sec bil LE Sit to stand: 5 times (17.2 seconds no UE support), (10.56 seconds no UE support)     05/24/23 TherEx Single knee to chest 5x15 sec bil Bridges 2x10; 5 sec hold Sidelying book openers 10 x 5 sec hold Sit to/from stand x 10 reps Rows L4 2x10 NuStep L6 x 8 min; UE/LE   10//02/2023 Therex: Lt sidelying regional rotation lumbar stretch 15 sec x 5 Supine bridge 2 x 10 SKC 15 sec x 5 bilateral Standing lumbar extension AROM x 5  Standing blue band rows 2 x 15 bilateral Standing blue band GH ext 2 x 15 bilateral Nustep lvl 6 8 mins UE/LE   Verbal cues for HEP review.   Manual: Percussive device to Rt QL in Lt sidelying for myofascial release.     PATIENT EDUCATION:   Education details: HEP, POC, DN  Person  educated: Patient Education method: Explanation, Demonstration, Verbal cues, and Handouts for HEP and DN Education comprehension: verbalized understanding, returned demonstration, and verbal cues required  HOME EXERCISE PROGRAM: Access Code: [email or web address] URL: [email or web address] Date: 05/09/2023 Prepared  by: Chyrel Masson  Exercises - Supine Bridge  - 1-2 x daily - 7 x weekly - 2 sets - 10 reps - 5 seconds hold - Supine Lower Trunk Rotation  - 1-2 x daily - 7 x weekly - 3-5 reps - 10 seconds hold - Hooklying Single Knee to Chest Stretch  - 1-2 x daily - 7 x weekly - 3-5 sets - 10 seconds hold - Seated Hamstring Stretch  - 1-2 x daily - 7 x weekly - 3 reps - 20 seconds hold - Standing Lumbar Extension at Wall - Forearms  - 1-2 x daily - 7 x weekly - 5-10 reps - 10 seconds hold - Sidelying Lumbar Rotation Stretch  - 1 x daily - 7 x weekly - 5 reps - 1 sets - 15 hold  ASSESSMENT:  CLINICAL IMPRESSION: Pt tolerating exercises well with good response to manual therapy. We started adding in more functional stretching related to pt's recreational goals. Continue skilled PT to maximize pt's function.       OBJECTIVE IMPAIRMENTS: decreased mobility, difficulty walking, decreased ROM, decreased strength, and pain.   ACTIVITY LIMITATIONS: lifting, bending, standing, and stairs  PARTICIPATION LIMITATIONS: driving and community activity, sleeping  PERSONAL FACTORS: 3+ comorbidities: see pertinent history above  are also affecting patient's functional outcome.   REHAB POTENTIAL: Good  CLINICAL DECISION MAKING: Stable/uncomplicated  EVALUATION COMPLEXITY: Low   GOALS: Goals reviewed with patient? Yes  SHORT TERM GOALS: (target date for Short term goals are 3 weeks 06/01/23)  1. Patient will demonstrate independent use of home exercise program to maintain progress from in clinic treatments.  Goal status:  MET 05/30/23  LONG TERM GOALS: (target dates for all long term  goals are 10 weeks  07/20/23 )   1. Patient will demonstrate/report pain at worst less than or equal to 2/10 to facilitate minimal limitation in daily activity secondary to pain symptoms.  Goal status: on-going 05/30/23   2. Patient will demonstrate independent use of home exercise program to facilitate ability to maintain/progress functional gains from skilled physical therapy services.  Goal status: New   3. Patient will demonstrate FOTO outcome > or = 68 % to indicate reduced disability due to condition.  Goal status: New   4. Patient will demonstrate lumbar extension 100 % WFL s symptoms to facilitate upright standing, walking posture at PLOF s limitation.  Goal status: on-going 05/30/23   5.  Pt will be able to lift 20# from floor to counter height using correct body mechanics with no pain.   Goal status: New   6.  Pt will improve his hamstring flexibility to >/= 60 deg bilaterally to help improve overall functional mobility.  Goal status: New   7. Pt will improve his bil trunk rotation to [facility] for return to golf.  Goal status: on-going 05/30/23    PLAN:  PT FREQUENCY: 1-2x/week  PT DURATION: 10 weeks  PLANNED INTERVENTIONS: Therapeutic exercises, Therapeutic activity, Neuro Muscular re-education, Balance training, Gait training, Patient/Family education, Joint mobilization, Stair training, DME instructions, Dry Needling, Electrical stimulation, Cryotherapy, vasopneumatic device, Moist heat, Taping, Traction Ultrasound, Ionotophoresis 4mg /ml Dexamethasone, and aquatic therapy, Manual therapy.  All included unless contraindicated  PLAN FOR NEXT SESSION: continue flexibility, hip/core strengthening, manual/modalities PRN   Narda Amber, PT, MPT 05/30/23 11:54 AM   05/30/23 11:54 AM

## 2023-06-04 ENCOUNTER — Encounter: Payer: Self-pay | Admitting: Physical Therapy

## 2023-06-04 ENCOUNTER — Ambulatory Visit: Payer: Medicare PPO | Admitting: Physical Therapy

## 2023-06-04 DIAGNOSIS — M6281 Muscle weakness (generalized): Secondary | ICD-10-CM

## 2023-06-04 DIAGNOSIS — R262 Difficulty in walking, not elsewhere classified: Secondary | ICD-10-CM | POA: Diagnosis not present

## 2023-06-04 DIAGNOSIS — M5459 Other low back pain: Secondary | ICD-10-CM | POA: Diagnosis not present

## 2023-06-04 NOTE — Therapy (Signed)
OUTPATIENT PHYSICAL THERAPY TREATMENT    Patient Name: Ernest Hughes. MRN: [id number] DOB:[birth year], 77 y.o., male Today's Date: 06/04/2023  END OF SESSION:  PT End of Session - 06/04/23 1123     Visit Number 7    Number of Visits 20    Date for PT Re-Evaluation 07/20/23    Authorization Type [employer]    Authorization Time Period -07/20/2023    Authorization - Visit Number 6    Authorization - Number of Visits 12    PT Start Time 1102    PT Stop Time 1140    PT Time Calculation (min) 38 min    Activity Tolerance Patient tolerated treatment well    Behavior During Therapy WFL for tasks assessed/performed                   Past Medical History:  Diagnosis Date   Arthritis    Asthma    as a baby   BPH (benign prostatic hyperplasia)    Cancer (HCC)    PROSTATE--treated with radiation   Diverticulosis    GERD (gastroesophageal reflux disease)    Hyperlipidemia    Hypertension    Past Surgical History:  Procedure Laterality Date   BELPHAROPTOSIS REPAIR  12/2013   bilateral   cataract surgery Left 2019   COLONOSCOPY     Dr. Jarold Motto   Patient Active Problem List   Diagnosis Date Noted   Lipoma of back 09/13/2022   New onset type 2 diabetes mellitus (HCC) 09/13/2022   Benign prostatic hyperplasia with nocturia 08/29/2021   History of arthroscopic knee surgery 11/18/2020   Arthritis 05/10/2020   History of cataract surgery, unspecified laterality 04/28/2019   Erectile dysfunction 04/28/2019   History of prostate cancer 05/22/2011   GERD 02/02/2009   Hyperlipidemia associated with type 2 diabetes mellitus (HCC) 02/01/2009   Hypertension associated with diabetes (HCC) 02/01/2009   Diverticulosis of colon 02/01/2009    PCP: Ronnald Nian, MD   REFERRING PROVIDER: Ronnald Nian, MD   REFERRING DIAG:  Diagnosis  M54.9 (ICD-10-CM) - Musculoskeletal back pain    Rationale for Evaluation and Treatment: Rehabilitation  THERAPY DIAG:  Other  low back pain  Muscle weakness (generalized)  Difficulty in walking, not elsewhere classified  ONSET DATE: "a while"  SUBJECTIVE:  SUBJECTIVE STATEMENT: Pt reports he has been showing progress but last night was a tough night.  Pt reporting he has been trying to wean off any pain meds. Pt stating using his chain saw last Monday. Pt reporting clean bill of health following his fall due to dehydration.   PERTINENT HISTORY:  DM, cancer (prostate treated with radiation), GERD, BPH, Diverticulosis, arthritis, asthma, HTN, hyperlipidemia, left meniscus surgery  PAIN:  NPRS scale: 6/10 Pain location: Rt sided low back  Pain description: achy Aggravating factors: bending, prolonged standing, sitting Relieving factors: muscle relaxer, over the counter meds, walking  PRECAUTIONS: None  WEIGHT BEARING RESTRICTIONS: No  FALLS:  Has patient fallen in last 6 months? No  LIVING ENVIRONMENT: Lives with: lives with their family and lives with their spouse Lives in: House/apartment Stairs: 5 steps outside rail on both,  1 flight inside: rail on left Has following equipment at home: None  OCCUPATION: retired  PLOF: Independent  PATIENT GOALS: walk more get back to gym and playing golf  Next MD Visit: follow up as needed   OBJECTIVE:   DIAGNOSTIC FINDINGS: 12/14/2016 Scoliosis lumbar spine. Diffuse degenerative changes multilevel disc degeneration again noted. No change from prior exam. No acute bony abnormality identified. No evidence of fracture. Pelvic calcifications consistent with phleboliths.   IMPRESSION: Scoliosis  lumbar spine. Diffuse degenerative change with multilevel disc degeneration again noted. No change from prior exam. No acute abnormality identified.  PATIENT SURVEYS:  05/09/23: FOTO eval:    60%   SCREENING FOR RED FLAGS: Bowel or bladder incontinence: No Cauda equina syndrome: No  COGNITION: Overall cognitive status: WFL normal      SENSATION: WFL  MUSCLE LENGTH: Hamstrings: Right 58 deg; Left 45 deg    PALPATION: 05/09/23 TTP: Rt QL and bilateral lumbar paraspinals  LUMBAR ROM:   AROM 05/09/23 05/21/2023 06/04/23  Flexion 55 finger tips to above knees To ankles, no complaints noted.  Finger tips to top of ankles, no pain noted   Extension 15 75% WFL  75% WFL  Right lateral flexion 22 To knee joint no complaints.    Left lateral flexion 20 To lateral epicondyle of femur.    Right rotation Limited 50%  WFL  Left rotation Limited 25%  WFL   (Blank rows = not tested)  LOWER EXTREMITY ROM:      Right 05/09/23 Supine active Left 05/09/23 Supine active Rt / Left 05/28/23 Supine  active  Hip flexion 100 98 110 / 106  Hip extension     Hip abduction     Hip adduction     Hip internal rotation     Hip external rotation     Knee flexion 120 125   Knee extension     Ankle dorsiflexion     Ankle plantarflexion     Ankle inversion     Ankle eversion      (Blank rows = not tested)  LOWER EXTREMITY MMT:    MMT Right 05/09/23 Left 05/09/23  Hip flexion 4 4  Hip extension    Hip abduction    Hip adduction 5 5  Hip internal rotation    Hip external rotation    Knee flexion 5 5  Knee extension 5 5  Ankle dorsiflexion    Ankle plantarflexion    Ankle inversion    Ankle eversion     (Blank rows = not tested)  LUMBAR SPECIAL TESTS:  05/09/23 Slump test: Negative bilaterally  FUNCTIONAL TESTS:  05/09/23 5 times sit  to stand: 17.8 seconds UE support  05/28/23:  5 times sit to stand: 10.56 seconds no UE support  GAIT: 05/09/23 Distance walked: clinic  distance Assistive device utilized: None Level of assistance: Complete [place] Comments: forward flexed at hips and head                                                                                                                                                                                                                  TODAY'S TREATMENT: DATE:  06/04/23 TherEx Supine trunk rotation: x 3 holding 30 sec Supine SKTC: x 3 holding 20 sec bil Supine SLR: x 15 bil Leg Press: 87# bil LE x 20 with slow eccentrics Standing golf swing trunk rotation: x 5 to each side using golf club around back  Standing hip extension:  x 15 bil  Standing trunk extension x 10  holding 5 sec TRX squats 2 x 10  Standing trunk rotation using bar to help with visual rotation biofeedback x 5 bil holding 5 sec Hip hiking on 6 inch step: x 10 bil LE Lateral step ups x 10 bil c UE support Manual Percussion to Rt QL  STM and active trigger point release to lumbar paraspinals    05/30/23 TherEx Nustep: level 5 x 8 minutes  Seated trunk flexion stretch over green physio-ball x 8, holding end range x 3-5 sec Seated QL stretch over green physio-ball x 3 to each side holding 15 sec Leg Press: 75# bil LE x 20 with slow eccentrics Standing golf swing trunk rotation: x 5 to each side using golf club around back  Standing hip extension:  x 15 bil  Standing trunk extension x 10  holding 5 sec Seated hamstring stretch: x 2 bil LE holding 30 sec Sidelying book openers 10 x 5 sec hold Trunk flexion using stool and ball on top to work on abdominal activation  x10 Manual Percussion to Rt QL  STM and active trigger point release     05/28/23 TherEx Recumbent bike: level 4 x 6 minutes Standing hip abduction: 2 x 10  Standing hip extension: 2 x 10  Standing trunk extension x 5 holding 10 sec Single knee to chest 5x15 sec bil Bridges 2 x10: 5 sec hold Sidelying book openers 10 x 5 sec hold Supine: glute  stretch: x 2 holding 20 sec bil LE Sit to stand: 5 times (17.2 seconds no UE support), (10.56 seconds no UE support)     PATIENT EDUCATION:   Education details:  HEP, POC, DN  Person educated: Patient Education method: Explanation, Demonstration, Verbal cues, and Handouts for HEP and DN Education comprehension: verbalized understanding, returned demonstration, and verbal cues required  HOME EXERCISE PROGRAM: Access Code: [email or web address] URL: [email or web address] Date: 05/09/2023 Prepared by: Chyrel Masson  Exercises - Supine Bridge  - 1-2 x daily - 7 x weekly - 2 sets - 10 reps - 5 seconds hold - Supine Lower Trunk Rotation  - 1-2 x daily - 7 x weekly - 3-5 reps - 10 seconds hold - Hooklying Single Knee to Chest Stretch  - 1-2 x daily - 7 x weekly - 3-5 sets - 10 seconds hold - Seated Hamstring Stretch  - 1-2 x daily - 7 x weekly - 3 reps - 20 seconds hold - Standing Lumbar Extension at Wall - Forearms  - 1-2 x daily - 7 x weekly - 5-10 reps - 10 seconds hold - Sidelying Lumbar Rotation Stretch  - 1 x daily - 7 x weekly - 5 reps - 1 sets - 15 hold  ASSESSMENT:  CLINICAL IMPRESSION: Pt again with good tolerance to today's treatment. No reports of pain during session. Pt also with improvements in bil trunk rotation since his initial evaluation. Recommend continued skilled PT interventions toward pt's LTG's set.       OBJECTIVE IMPAIRMENTS: decreased mobility, difficulty walking, decreased ROM, decreased strength, and pain.   ACTIVITY LIMITATIONS: lifting, bending, standing, and stairs  PARTICIPATION LIMITATIONS: driving and community activity, sleeping  PERSONAL FACTORS: 3+ comorbidities: see pertinent history above  are also affecting patient's functional outcome.   REHAB POTENTIAL: Good  CLINICAL DECISION MAKING: Stable/uncomplicated  EVALUATION COMPLEXITY: Low   GOALS: Goals reviewed with patient? Yes  SHORT TERM GOALS: (target date for Short term goals  are 3 weeks 06/01/23)  1. Patient will demonstrate independent use of home exercise program to maintain progress from in clinic treatments.  Goal status:  MET 05/30/23  LONG TERM GOALS: (target dates for all long term goals are 10 weeks  07/20/23 )   1. Patient will demonstrate/report pain at worst less than or equal to 2/10 to facilitate minimal limitation in daily activity secondary to pain symptoms.  Goal status: on-going 05/30/23   2. Patient will demonstrate independent use of home exercise program to facilitate ability to maintain/progress functional gains from skilled physical therapy services.  Goal status: New   3. Patient will demonstrate FOTO outcome > or = 68 % to indicate reduced disability due to condition.  Goal status: New   4. Patient will demonstrate lumbar extension 100 % WFL s symptoms to facilitate upright standing, walking posture at PLOF s limitation.  Goal status: on-going 05/30/23   5.  Pt will be able to lift 20# from floor to counter height using correct body mechanics with no pain.   Goal status: New   6.  Pt will improve his hamstring flexibility to >/= 60 deg bilaterally to help improve overall functional mobility.  Goal status: New   7. Pt will improve his bil trunk rotation to [facility] for return to golf.  Goal status: on-going 05/30/23    PLAN:  PT FREQUENCY: 1-2x/week  PT DURATION: 10 weeks  PLANNED INTERVENTIONS: Therapeutic exercises, Therapeutic activity, Neuro Muscular re-education, Balance training, Gait training, Patient/Family education, Joint mobilization, Stair training, DME instructions, Dry Needling, Electrical stimulation, Cryotherapy, vasopneumatic device, Moist heat, Taping, Traction Ultrasound, Ionotophoresis 4mg /ml Dexamethasone, and aquatic therapy, Manual therapy.  All included unless contraindicated  PLAN FOR NEXT SESSION: continue flexibility,  hip/core strengthening, manual/modalities PRN   Narda Amber, PT, MPT 06/04/23  11:45 AM   06/04/23 11:45 AM

## 2023-06-06 ENCOUNTER — Encounter: Payer: Self-pay | Admitting: Physical Therapy

## 2023-06-06 ENCOUNTER — Ambulatory Visit: Payer: Medicare PPO | Admitting: Physical Therapy

## 2023-06-06 DIAGNOSIS — M6281 Muscle weakness (generalized): Secondary | ICD-10-CM | POA: Diagnosis not present

## 2023-06-06 DIAGNOSIS — R262 Difficulty in walking, not elsewhere classified: Secondary | ICD-10-CM

## 2023-06-06 DIAGNOSIS — M5459 Other low back pain: Secondary | ICD-10-CM

## 2023-06-06 NOTE — Therapy (Signed)
OUTPATIENT PHYSICAL THERAPY TREATMENT    Patient Name: Ernest Hughes. MRN: [id number] DOB:Dec 12, 1945, 77 y.o., male Today's Date: 06/06/2023  END OF SESSION:  PT End of Session - 06/06/23 1148     Visit Number 8    Number of Visits 20    Date for PT Re-Evaluation 07/20/23    Authorization Type [employer]    Authorization Time Period -07/20/2023    Authorization - Number of Visits 12    PT Start Time 1102    PT Stop Time 1145    PT Time Calculation (min) 43 min    Activity Tolerance Patient tolerated treatment well    Behavior During Therapy WFL for tasks assessed/performed                    Past Medical History:  Diagnosis Date   Arthritis    Asthma    as a baby   BPH (benign prostatic hyperplasia)    Cancer (HCC)    PROSTATE--treated with radiation   Diverticulosis    GERD (gastroesophageal reflux disease)    Hyperlipidemia    Hypertension    Past Surgical History:  Procedure Laterality Date   BELPHAROPTOSIS REPAIR  12/2013   bilateral   cataract surgery Left 2019   COLONOSCOPY     Dr. Jarold Motto   Patient Active Problem List   Diagnosis Date Noted   Lipoma of back 09/13/2022   New onset type 2 diabetes mellitus (HCC) 09/13/2022   Benign prostatic hyperplasia with nocturia 08/29/2021   History of arthroscopic knee surgery 11/18/2020   Arthritis 05/10/2020   History of cataract surgery, unspecified laterality 04/28/2019   Erectile dysfunction 04/28/2019   History of prostate cancer 05/22/2011   GERD 02/02/2009   Hyperlipidemia associated with type 2 diabetes mellitus (HCC) 02/01/2009   Hypertension associated with diabetes (HCC) 02/01/2009   Diverticulosis of colon 02/01/2009    PCP: Ronnald Nian, MD   REFERRING PROVIDER: Ronnald Nian, MD   REFERRING DIAG:  Diagnosis  M54.9 (ICD-10-CM) - Musculoskeletal back pain    Rationale for Evaluation and Treatment: Rehabilitation  THERAPY DIAG:  Other low back pain  Muscle weakness  (generalized)  Difficulty in walking, not elsewhere classified  ONSET DATE: "a while"  SUBJECTIVE:  SUBJECTIVE STATEMENT: Pt reporting 5/10 pain in his low back. Pt stating he feels like his sleeping patterns and positions may be affecting his back. Pt stating he and his wife are looking into investing into a firmer mattress.   PERTINENT HISTORY:  DM, cancer (prostate treated with radiation), GERD, BPH, Diverticulosis, arthritis, asthma, HTN, hyperlipidemia, left meniscus surgery  PAIN:  NPRS scale: 5/10 Pain location: Rt sided low back  Pain description: achy Aggravating factors: bending, prolonged standing, sitting Relieving factors: muscle relaxer, over the counter meds, walking  PRECAUTIONS: None  WEIGHT BEARING RESTRICTIONS: No  FALLS:  Has patient fallen in last 6 months? No  LIVING ENVIRONMENT: Lives with: lives with their family and lives with their spouse Lives in: House/apartment Stairs: 5 steps outside rail on both,  1 flight inside: rail on left Has following equipment at home: None  OCCUPATION: retired  PLOF: Independent  PATIENT GOALS: walk more get back to gym and playing golf  Next MD Visit: follow up as needed   OBJECTIVE:   DIAGNOSTIC FINDINGS: 12/14/2016 Scoliosis lumbar spine. Diffuse degenerative changes multilevel disc degeneration again noted. No change from prior exam. No acute bony abnormality identified. No evidence of fracture. Pelvic calcifications consistent with phleboliths.   IMPRESSION: Scoliosis lumbar spine. Diffuse degenerative change with multilevel disc degeneration  again noted. No change from prior exam. No acute abnormality identified.  PATIENT SURVEYS:  05/09/23: FOTO eval:    60%   SCREENING FOR RED FLAGS: Bowel or bladder incontinence: No Cauda equina syndrome: No  COGNITION: Overall cognitive status: WFL normal      SENSATION: WFL  MUSCLE LENGTH: Eval: Hamstrings: Right 58 deg; Left 45 deg     PALPATION: 05/09/23 TTP: Rt QL and bilateral lumbar paraspinals  LUMBAR ROM:   AROM 05/09/23 05/21/2023 06/04/23  Flexion 55 finger tips to above knees To ankles, no complaints noted.  Finger tips to top of ankles, no pain noted   Extension 15 75% WFL  75% WFL  Right lateral flexion 22 To knee joint no complaints.    Left lateral flexion 20 To lateral epicondyle of femur.    Right rotation Limited 50%  WFL  Left rotation Limited 25%  WFL   (Blank rows = not tested)  LOWER EXTREMITY ROM:      Right 05/09/23 Supine active Left 05/09/23 Supine active Rt / Left 05/28/23 Supine  active  Hip flexion 100 98 110 / 106  Hip extension     Hip abduction     Hip adduction     Hip internal rotation     Hip external rotation     Knee flexion 120 125   Knee extension     Ankle dorsiflexion     Ankle plantarflexion     Ankle inversion     Ankle eversion      (Blank rows = not tested)  LOWER EXTREMITY MMT:    MMT Right 05/09/23 Left 05/09/23  Hip flexion 4 4  Hip extension    Hip abduction    Hip adduction 5 5  Hip internal rotation    Hip external rotation    Knee flexion 5 5  Knee extension 5 5  Ankle dorsiflexion    Ankle plantarflexion    Ankle inversion    Ankle eversion     (Blank rows = not tested)  LUMBAR SPECIAL TESTS:  05/09/23 Slump test: Negative bilaterally  FUNCTIONAL TESTS:  05/09/23 5 times sit to stand: 17.8 seconds UE support 05/28/23:  5 times sit to stand: 10.56 seconds no UE support 06/06/23:  5 times sit to stand: 9.8 seconds No UE support   GAIT: 05/09/23 Distance walked: clinic  distance Assistive device utilized: None Level of assistance: Complete [place] Comments: forward flexed at hips and head                                                                                                                                                                                                                 TODAY'S TREATMENT: DATE:  06/06/23 TherEx Nustep: level 6 x 7 minutes Calf raises: x 20 holding 3 sec Leg Press: 93# bil LE 2 x 12 with slow eccentrics Dead lifts in lung position x 10 bil c 10# kettle bell , pt requiring technique correction and further tactile and verbal instructions Mini squats with 10# kettle  Step ups on 8 inch step c single UE support x 15 bil LE leading Standing trunk extension: x 10 holding 5 sec Bridge c clam shell x 15  Prone hip extension x 20  Manual Percussion to Rt QL  STM and active trigger point release to lumbar paraspinals Pt instructed in self STM using tennis ball in standing against the wall    TODAY'S TREATMENT: DATE:  06/04/23 TherEx Supine trunk rotation: x 3 holding 30 sec Supine SKTC: x 3 holding 20 sec bil Supine SLR: x 15 bil Leg Press: 87# bil LE x 20 with slow eccentrics Standing golf swing trunk rotation: x 5 to each side using golf club around back  Standing hip extension:  x 15 bil  Standing trunk extension x 10  holding 5 sec TRX squats 2 x 10  Standing trunk rotation using bar to help with visual rotation biofeedback x 5 bil holding 5 sec Hip hiking on 6 inch step: x 10 bil LE Lateral step ups x 10 bil c UE support Manual Percussion to Rt QL  STM and active trigger point release to lumbar paraspinals    05/30/23 TherEx Nustep: level 5 x 8 minutes  Seated trunk flexion stretch over green physio-ball x 8, holding end range x 3-5 sec Seated QL stretch over green physio-ball x 3 to each side holding 15 sec Leg Press: 75# bil LE x 20 with slow eccentrics Standing golf swing trunk rotation: x  5 to each side using golf club around back  Standing hip extension:  x 15 bil  Standing trunk extension x 10  holding 5 sec Seated hamstring stretch: x 2 bil LE  holding 30 sec Sidelying book openers 10 x 5 sec hold Trunk flexion using stool and ball on top to work on abdominal activation  x10 Manual Percussion to Rt QL  STM and active trigger point release       PATIENT EDUCATION:   Education details: HEP, POC, DN  Person educated: Patient Education method: Explanation, Demonstration, Verbal cues, and Handouts for HEP and DN Education comprehension: verbalized understanding, returned demonstration, and verbal cues required  HOME EXERCISE PROGRAM: Access Code: [email or web address] URL: [email or web address] Date: 05/09/2023 Prepared by: Chyrel Masson  Exercises - Supine Bridge  - 1-2 x daily - 7 x weekly - 2 sets - 10 reps - 5 seconds hold - Supine Lower Trunk Rotation  - 1-2 x daily - 7 x weekly - 3-5 reps - 10 seconds hold - Hooklying Single Knee to Chest Stretch  - 1-2 x daily - 7 x weekly - 3-5 sets - 10 seconds hold - Seated Hamstring Stretch  - 1-2 x daily - 7 x weekly - 3 reps - 20 seconds hold - Standing Lumbar Extension at Wall - Forearms  - 1-2 x daily - 7 x weekly - 5-10 reps - 10 seconds hold - Sidelying Lumbar Rotation Stretch  - 1 x daily - 7 x weekly - 5 reps - 1 sets - 15 hold  ASSESSMENT:  CLINICAL IMPRESSION: Pt tolerating all exercises well today. We discussed positioning for side sleeping using pillows for support. Pt has improved his 5 time sit to stand since his initial evaluation. Pt was issued a tennis ball for home self soft tissue mobilizations. Recommend continue skilled PT to maximize pt's function.       OBJECTIVE IMPAIRMENTS: decreased mobility, difficulty walking, decreased ROM, decreased strength, and pain.   ACTIVITY LIMITATIONS: lifting, bending, standing, and stairs  PARTICIPATION LIMITATIONS: driving and community activity,  sleeping  PERSONAL FACTORS: 3+ comorbidities: see pertinent history above  are also affecting patient's functional outcome.   REHAB POTENTIAL: Good  CLINICAL DECISION MAKING: Stable/uncomplicated  EVALUATION COMPLEXITY: Low   GOALS: Goals reviewed with patient? Yes  SHORT TERM GOALS: (target date for Short term goals are 3 weeks 06/01/23)  1. Patient will demonstrate independent use of home exercise program to maintain progress from in clinic treatments.  Goal status:  MET 05/30/23  LONG TERM GOALS: (target dates for all long term goals are 10 weeks  07/20/23 )   1. Patient will demonstrate/report pain at worst less than or equal to 2/10 to facilitate minimal limitation in daily activity secondary to pain symptoms.  Goal status: on-going 05/30/23   2. Patient will demonstrate independent use of home exercise program to facilitate ability to maintain/progress functional gains from skilled physical therapy services.  Goal status: New   3. Patient will demonstrate FOTO outcome > or = 68 % to indicate reduced disability due to condition.  Goal status: New   4. Patient will demonstrate lumbar extension 100 % WFL s symptoms to facilitate upright standing, walking posture at PLOF s limitation.  Goal status: on-going 05/30/23   5.  Pt will be able to lift 20# from floor to counter height using correct body mechanics with no pain.   Goal status: New   6.  Pt will improve his hamstring flexibility to >/= 60 deg bilaterally to help improve overall functional mobility.  Goal status: New   7. Pt will improve his bil trunk rotation to [facility] for return to golf.  Goal status: on-going 05/30/23  PLAN:  PT FREQUENCY: 1-2x/week  PT DURATION: 10 weeks  PLANNED INTERVENTIONS: Therapeutic exercises, Therapeutic activity, Neuro Muscular re-education, Balance training, Gait training, Patient/Family education, Joint mobilization, Stair training, DME instructions, Dry Needling, Electrical  stimulation, Cryotherapy, vasopneumatic device, Moist heat, Taping, Traction Ultrasound, Ionotophoresis 4mg /ml Dexamethasone, and aquatic therapy, Manual therapy.  All included unless contraindicated  PLAN FOR NEXT SESSION: continue flexibility, hip/core strengthening, manual/modalities PRN   Narda Amber, PT, MPT 06/06/23 11:49 AM   06/06/23 11:49 AM

## 2023-06-11 ENCOUNTER — Other Ambulatory Visit: Payer: Medicare PPO

## 2023-06-11 ENCOUNTER — Telehealth: Payer: Self-pay | Admitting: Family Medicine

## 2023-06-11 NOTE — Telephone Encounter (Signed)
Pt is wanting to know if he is up to date with his pneumonia shots?

## 2023-06-11 NOTE — Telephone Encounter (Signed)
Husband due for prevnar 20

## 2023-06-12 ENCOUNTER — Other Ambulatory Visit (INDEPENDENT_AMBULATORY_CARE_PROVIDER_SITE_OTHER): Payer: Medicare PPO

## 2023-06-12 DIAGNOSIS — Z23 Encounter for immunization: Secondary | ICD-10-CM

## 2023-06-13 ENCOUNTER — Encounter: Payer: Self-pay | Admitting: Physical Therapy

## 2023-06-13 ENCOUNTER — Ambulatory Visit: Payer: Medicare PPO | Admitting: Physical Therapy

## 2023-06-13 DIAGNOSIS — R262 Difficulty in walking, not elsewhere classified: Secondary | ICD-10-CM | POA: Diagnosis not present

## 2023-06-13 DIAGNOSIS — M6281 Muscle weakness (generalized): Secondary | ICD-10-CM

## 2023-06-13 DIAGNOSIS — M5459 Other low back pain: Secondary | ICD-10-CM

## 2023-06-13 NOTE — Therapy (Addendum)
OUTPATIENT PHYSICAL THERAPY TREATMENT    Patient Name: Ernest Hughes. MRN: [id number] DOB:1946-05-22, 77 y.o., male Today's Date: 06/13/2023  END OF SESSION:   1547      PT Visits / Re-Eval   Visit Number    9  Number of Visits    20  Date for PT Re-Evaluation    07/20/2023  Authorization   Authorization Type    [employer]  Authorization Time Period    -07/20/2023  Authorization - Visit Number    7  Authorization - Number of Visits    12  Progress Note Due on Visit -- --  --  PT Time Calculation   PT Start Time    1515  PT Stop Time    1557  PT Time Calculation (min)    42 min  PT - End of Session   Equipment Utilized During Treatment -- -- -- --  Activity Tolerance    Patient tolerated treatment well  Behavior During Therapy    WFL for tasks assessed/performed          Past Medical History:  Diagnosis Date   Arthritis    Asthma    as a baby   BPH (benign prostatic hyperplasia)    Cancer (HCC)    PROSTATE--treated with radiation   Diverticulosis    GERD (gastroesophageal reflux disease)    Hyperlipidemia    Hypertension    Past Surgical History:  Procedure Laterality Date   BELPHAROPTOSIS REPAIR  12/2013   bilateral   cataract surgery Left 2019   COLONOSCOPY     Dr. Jarold Motto   Patient Active Problem List   Diagnosis Date Noted   Lipoma of back 09/13/2022   New onset type 2 diabetes mellitus (HCC) 09/13/2022   Benign prostatic hyperplasia with nocturia 08/29/2021   History of arthroscopic knee surgery 11/18/2020   Arthritis 05/10/2020   History of cataract surgery, unspecified laterality 04/28/2019   Erectile dysfunction 04/28/2019   History of prostate cancer 05/22/2011   GERD 02/02/2009   Hyperlipidemia associated with type 2 diabetes mellitus (HCC) 02/01/2009   Hypertension associated with diabetes (HCC) 02/01/2009   Diverticulosis of colon 02/01/2009    PCP: Ronnald Nian, MD   REFERRING PROVIDER: Ronnald Nian, MD   REFERRING  DIAG:  Diagnosis  M54.9 (ICD-10-CM) - Musculoskeletal back pain    Rationale for Evaluation and Treatment: Rehabilitation  THERAPY DIAG:  Other low back pain  Muscle weakness (generalized)  Difficulty in walking, not elsewhere classified  ONSET DATE: "a while"  SUBJECTIVE:  SUBJECTIVE STATEMENT: He reports Rt side of low back is nagging him but he was able to walk 1.5 mile walk   PERTINENT HISTORY:  DM, cancer (prostate treated with radiation), GERD, BPH, Diverticulosis, arthritis, asthma, HTN, hyperlipidemia, left meniscus surgery  PAIN:  NPRS scale: 4/10 Pain location: Rt sided low back  Pain description: achy Aggravating factors: bending, prolonged standing, sitting Relieving factors: muscle relaxer, over the counter meds, walking  PRECAUTIONS: None  WEIGHT BEARING RESTRICTIONS: No  FALLS:  Has patient fallen in last 6 months? No  LIVING ENVIRONMENT: Lives with: lives with their family and lives with their spouse Lives in: House/apartment Stairs: 5 steps outside rail on both,  1 flight inside: rail on left Has following equipment at home: None  OCCUPATION: retired  PLOF: Independent  PATIENT GOALS: walk more get back to gym and playing golf  Next MD Visit: follow up as needed   OBJECTIVE:   DIAGNOSTIC FINDINGS: 12/14/2016 Scoliosis lumbar spine. Diffuse degenerative changes multilevel disc degeneration again noted. No change from prior exam. No acute bony abnormality identified. No evidence of fracture. Pelvic calcifications consistent with phleboliths.   IMPRESSION: Scoliosis lumbar spine.  Diffuse degenerative change with multilevel disc degeneration again noted. No change from prior exam. No acute abnormality identified.  PATIENT SURVEYS:  05/09/23: FOTO eval:    60%   SCREENING FOR RED FLAGS: Bowel or bladder incontinence: No Cauda equina syndrome: No  COGNITION: Overall cognitive status: WFL normal      SENSATION: WFL  MUSCLE LENGTH: Eval: Hamstrings: Right 58 deg; Left 45 deg     PALPATION: 05/09/23 TTP: Rt QL and bilateral lumbar paraspinals  LUMBAR ROM:   AROM 05/09/23 05/21/2023 06/04/23  Flexion 55 finger tips to above knees To ankles, no complaints noted.  Finger tips to top of ankles, no pain noted   Extension 15 75% WFL  75% WFL  Right lateral flexion 22 To knee joint no complaints.    Left lateral flexion 20 To lateral epicondyle of femur.    Right rotation Limited 50%  WFL  Left rotation Limited 25%  WFL   (Blank rows = not tested)  LOWER EXTREMITY ROM:      Right 05/09/23 Supine active Left 05/09/23 Supine active Rt / Left 05/28/23 Supine  active  Hip flexion 100 98 110 / 106  Hip extension     Hip abduction     Hip adduction     Hip internal rotation     Hip external rotation     Knee flexion 120 125   Knee extension     Ankle dorsiflexion     Ankle plantarflexion     Ankle inversion     Ankle eversion      (Blank rows = not tested)  LOWER EXTREMITY MMT:    MMT Right 05/09/23 Left 05/09/23  Hip flexion 4 4  Hip extension    Hip abduction    Hip adduction 5 5  Hip internal rotation    Hip external rotation    Knee flexion 5 5  Knee extension 5 5  Ankle dorsiflexion    Ankle plantarflexion    Ankle inversion    Ankle eversion     (Blank rows = not tested)  LUMBAR SPECIAL TESTS:  05/09/23 Slump test: Negative bilaterally  FUNCTIONAL TESTS:  05/09/23 5 times sit to stand: 17.8 seconds UE support 05/28/23:  5 times sit to stand: 10.56 seconds no UE support 06/06/23:  5 times sit to stand: 9.8  seconds No UE  support   GAIT: 05/09/23 Distance walked: clinic distance Assistive device utilized: None Level of assistance: Complete Independence Comments: forward flexed at hips and head                                                                                                                                                                                                                 TODAY'S TREATMENT: DATE:  06/13/23 TherEx Nustep: level 6 x 8 minutes Seated row machine 35# 2X15 Seated lat pulldown machine 35# 2X15 Leg Press: 100# bil LE 2 x 12 with slow eccentrics Dead lifts 8 inch step to waist 2X10 with 10#, cues and demo for form and body mechanics Step ups on 8 inch step c single UE support x 15 bil LE leading Standing trunk extension: x 10 holding 3 sec Supine low trunk rotations 5 sec X 10 bilat Supine SKTC stretch 15 sec  X3 bilat Bridge c clam shell x 10 holding 5 sec Prone hip extension 2X10 bilat  Manual Percussion to Rt QL    06/06/23 TherEx Nustep: level 6 x 7 minutes Calf raises: x 20 holding 3 sec Leg Press: 93# bil LE 2 x 12 with slow eccentrics Dead lifts in lung position x 10 bil c 10# kettle bell , pt requiring technique correction and further tactile and verbal instructions Mini squats with 10# kettle  Step ups on 8 inch step c single UE support x 15 bil LE leading Standing trunk extension: x 10 holding 5 sec Bridge c clam shell x 15  Prone hip extension x 20  Manual Percussion to Rt QL  STM and active trigger point release to lumbar paraspinals Pt instructed in self STM using tennis ball in standing against the wall    TODAY'S TREATMENT: DATE:  06/04/23 TherEx Supine trunk rotation: x 3 holding 30 sec Supine SKTC: x 3 holding 20 sec bil Supine SLR: x 15 bil Leg Press: 87# bil LE x 20 with slow eccentrics Standing golf swing trunk rotation: x 5 to each side using golf club around back  Standing hip extension:  x 15 bil  Standing trunk extension  x 10  holding 5 sec TRX squats 2 x 10  Standing trunk rotation using bar to help with visual rotation biofeedback x 5 bil holding 5 sec Hip hiking on 6 inch step: x 10 bil LE Lateral step ups x 10 bil c UE support Manual Percussion to Rt QL  STM and active trigger point release to  lumbar paraspinals    PATIENT EDUCATION:   Education details: HEP, POC, DN  Person educated: Patient Education method: Explanation, Demonstration, Verbal cues, and Handouts for HEP and DN Education comprehension: verbalized understanding, returned demonstration, and verbal cues required  HOME EXERCISE PROGRAM: Access Code: [email or web address] URL: [email or web address] Date: 05/09/2023 Prepared by: Chyrel Masson  Exercises - Supine Bridge  - 1-2 x daily - 7 x weekly - 2 sets - 10 reps - 5 seconds hold - Supine Lower Trunk Rotation  - 1-2 x daily - 7 x weekly - 3-5 reps - 10 seconds hold - Hooklying Single Knee to Chest Stretch  - 1-2 x daily - 7 x weekly - 3-5 sets - 10 seconds hold - Seated Hamstring Stretch  - 1-2 x daily - 7 x weekly - 3 reps - 20 seconds hold - Standing Lumbar Extension at Wall - Forearms  - 1-2 x daily - 7 x weekly - 5-10 reps - 10 seconds hold - Sidelying Lumbar Rotation Stretch  - 1 x daily - 7 x weekly - 5 reps - 1 sets - 15 hold  ASSESSMENT:  CLINICAL IMPRESSION: He had good tolerance to strength progressions today and mobility work. He will continue to benefit from skilled PT to improve his function. This was the last visit he had scheduled so he was insructed to set up more on his way out today.      OBJECTIVE IMPAIRMENTS: decreased mobility, difficulty walking, decreased ROM, decreased strength, and pain.   ACTIVITY LIMITATIONS: lifting, bending, standing, and stairs  PARTICIPATION LIMITATIONS: driving and community activity, sleeping  PERSONAL FACTORS: 3+ comorbidities: see pertinent history above  are also affecting patient's functional outcome.   REHAB  POTENTIAL: Good  CLINICAL DECISION MAKING: Stable/uncomplicated  EVALUATION COMPLEXITY: Low   GOALS: Goals reviewed with patient? Yes  SHORT TERM GOALS: (target date for Short term goals are 3 weeks 06/01/23)  1. Patient will demonstrate independent use of home exercise program to maintain progress from in clinic treatments.  Goal status:  MET 05/30/23  LONG TERM GOALS: (target dates for all long term goals are 10 weeks  07/20/23 )   1. Patient will demonstrate/report pain at worst less than or equal to 2/10 to facilitate minimal limitation in daily activity secondary to pain symptoms.  Goal status: on-going 05/30/23   2. Patient will demonstrate independent use of home exercise program to facilitate ability to maintain/progress functional gains from skilled physical therapy services.  Goal status: ongoing    3. Patient will demonstrate FOTO outcome > or = 68 % to indicate reduced disability due to condition.  Goal status: New   4. Patient will demonstrate lumbar extension 100 % WFL s symptoms to facilitate upright standing, walking posture at PLOF s limitation.  Goal status: on-going 05/30/23   5.  Pt will be able to lift 20# from floor to counter height using correct body mechanics with no pain.   Goal status: New   6.  Pt will improve his hamstring flexibility to >/= 60 deg bilaterally to help improve overall functional mobility.  Goal status: New   7. Pt will improve his bil trunk rotation to [facility] for return to golf.  Goal status: on-going 05/30/23    PLAN:  PT FREQUENCY: 1-2x/week  PT DURATION: 10 weeks  PLANNED INTERVENTIONS: Therapeutic exercises, Therapeutic activity, Neuro Muscular re-education, Balance training, Gait training, Patient/Family education, Joint mobilization, Stair training, DME instructions, Dry Needling, Electrical stimulation, Cryotherapy, vasopneumatic device, Moist heat, Taping, Traction Ultrasound,  Ionotophoresis 4mg /ml Dexamethasone, and  aquatic therapy, Manual therapy.  All included unless contraindicated  PLAN FOR NEXT SESSION: continue flexibility, hip/core strengthening, manual/modalities PRN   Ivery Quale, PT, DPT 06/13/23 3:04 PM

## 2023-06-27 ENCOUNTER — Encounter: Payer: Self-pay | Admitting: Physical Therapy

## 2023-06-27 ENCOUNTER — Ambulatory Visit: Payer: Medicare PPO | Admitting: Physical Therapy

## 2023-06-27 DIAGNOSIS — R262 Difficulty in walking, not elsewhere classified: Secondary | ICD-10-CM | POA: Diagnosis not present

## 2023-06-27 DIAGNOSIS — M5459 Other low back pain: Secondary | ICD-10-CM

## 2023-06-27 DIAGNOSIS — M6281 Muscle weakness (generalized): Secondary | ICD-10-CM | POA: Diagnosis not present

## 2023-06-27 NOTE — Therapy (Signed)
OUTPATIENT PHYSICAL THERAPY TREATMENT Progress Note reporting period 05/09/23 to 06/27/23  See below for objective and subjective measurements relating to patients progress with PT.    Patient Name: Ernest Hughes. MRN: [id number] DOB:January 22, 1946, 77 y.o., male Today's Date: 06/27/2023  END OF SESSION:  PT End of Session - 06/27/23 1116     Visit Number 10    Number of Visits 20    Date for PT Re-Evaluation 07/20/23    Authorization Type [employer]    Authorization Time Period -07/20/2023    Authorization - Visit Number 8    Authorization - Number of Visits 12    PT Start Time 1100    PT Stop Time 1145    PT Time Calculation (min) 45 min    Activity Tolerance Patient tolerated treatment well    Behavior During Therapy WFL for tasks assessed/performed                  Past Medical History:  Diagnosis Date   Arthritis    Asthma    as a baby   BPH (benign prostatic hyperplasia)    Cancer (HCC)    PROSTATE--treated with radiation   Diverticulosis    GERD (gastroesophageal reflux disease)    Hyperlipidemia    Hypertension    Past Surgical History:  Procedure Laterality Date   BELPHAROPTOSIS REPAIR  12/2013   bilateral   cataract surgery Left 2019   COLONOSCOPY     Dr. Jarold Motto   Patient Active Problem List   Diagnosis Date Noted   Lipoma of back 09/13/2022   New onset type 2 diabetes mellitus (HCC) 09/13/2022   Benign prostatic hyperplasia with nocturia 08/29/2021   History of arthroscopic knee surgery 11/18/2020   Arthritis 05/10/2020   History of cataract surgery, unspecified laterality 04/28/2019   Erectile dysfunction 04/28/2019   History of prostate cancer 05/22/2011   GERD 02/02/2009   Hyperlipidemia associated with type 2 diabetes mellitus (HCC) 02/01/2009   Hypertension associated with diabetes (HCC) 02/01/2009   Diverticulosis of colon 02/01/2009    PCP: Ronnald Nian, MD   REFERRING PROVIDER: Ronnald Nian, MD   REFERRING DIAG:   Diagnosis  M54.9 (ICD-10-CM) - Musculoskeletal back pain    Rationale for Evaluation and Treatment: Rehabilitation  THERAPY DIAG:  Other low back pain  Muscle weakness (generalized)  Difficulty in walking, not elsewhere classified  ONSET DATE: "a while"  SUBJECTIVE:  SUBJECTIVE STATEMENT: He reports overall making progress with PT and less pain  PERTINENT HISTORY:  DM, cancer (prostate treated with radiation), GERD, BPH, Diverticulosis, arthritis, asthma, HTN, hyperlipidemia, left meniscus surgery  PAIN:  NPRS scale: 3/10 Pain location: Rt sided low back  Pain description: achy Aggravating factors: bending, prolonged standing, sitting Relieving factors: muscle relaxer, over the counter meds, walking  PRECAUTIONS: None  WEIGHT BEARING RESTRICTIONS: No  FALLS:  Has patient fallen in last 6 months? No  LIVING ENVIRONMENT: Lives with: lives with their family and lives with their spouse Lives in: House/apartment Stairs: 5 steps outside rail on both,  1 flight inside: rail on left Has following equipment at home: None  OCCUPATION: retired  PLOF: Independent  PATIENT GOALS: walk more get back to gym and playing golf  Next MD Visit: follow up as needed   OBJECTIVE:   DIAGNOSTIC FINDINGS: 12/14/2016 Scoliosis lumbar spine. Diffuse degenerative changes multilevel disc degeneration again noted. No change from prior exam. No acute bony abnormality identified. No evidence of fracture. Pelvic calcifications consistent with phleboliths.   IMPRESSION: Scoliosis lumbar spine. Diffuse degenerative change with  multilevel disc degeneration again noted. No change from prior exam. No acute abnormality identified.  PATIENT SURVEYS:  05/09/23: FOTO eval:    60% 06/27/23 FOTO decreased to 59% but he admits he answered the question about doing vigorous activities wrong the first day at intake, he rated at little difficulty and should have been a lot of difficulty.    SCREENING FOR RED FLAGS: Bowel or bladder incontinence: No Cauda equina syndrome: No  COGNITION: Overall cognitive status: WFL normal      SENSATION: WFL  MUSCLE LENGTH: Eval: Hamstrings: Right 58 deg; Left 45 deg     PALPATION: 05/09/23 TTP: Rt QL and bilateral lumbar paraspinals  LUMBAR ROM:   AROM 05/09/23 05/21/2023 06/04/23 06/27/23  Flexion 55 finger tips to above knees To ankles, no complaints noted.  Finger tips to top of ankles, no pain noted  WNL  Extension 15 75% WFL  75% WFL WNL  Right lateral flexion 22 To knee joint no complaints.     Left lateral flexion 20 To lateral epicondyle of femur.     Right rotation Limited 50%  WFL WNL  Left rotation Limited 25%  WFL WNL   (Blank rows = not tested)  LOWER EXTREMITY ROM:      Right 05/09/23 Supine active Left 05/09/23 Supine active Rt / Left 05/28/23 Supine  active  Hip flexion 100 98 110 / 106  Hip extension     Hip abduction     Hip adduction     Hip internal rotation     Hip external rotation     Knee flexion 120 125   Knee extension     Ankle dorsiflexion     Ankle plantarflexion     Ankle inversion     Ankle eversion      (Blank rows = not tested)  LOWER EXTREMITY MMT:    MMT Right 05/09/23 Left 05/09/23 Rt/Lt 06/27/23  Hip flexion 4 4 5/5  Hip extension     Hip abduction     Hip adduction 5 5   Hip internal rotation     Hip external rotation     Knee flexion 5 5   Knee extension 5 5   Ankle dorsiflexion     Ankle plantarflexion     Ankle inversion     Ankle eversion      (Blank rows =  not tested)  LUMBAR SPECIAL TESTS:   05/09/23 Slump test: Negative bilaterally  FUNCTIONAL TESTS:  05/09/23 5 times sit to stand: 17.8 seconds UE support 05/28/23:  5 times sit to stand: 10.56 seconds no UE support 06/06/23:  5 times sit to stand: 9.8 seconds No UE support   GAIT: 05/09/23 Distance walked: clinic distance Assistive device utilized: None Level of assistance: Complete Independence Comments: forward flexed at hips and head                                                                                                                                                                                                                 TODAY'S TREATMENT: DATE:  06/27/23 TherEx Recumbent bike L3 X 9 minutes Seated row machine 35# 2X15 Seated lat pulldown machine 35# 2X15 Leg Press: 100# bil LE 2 x 15 with slow eccentrics Diagonal chops/golf downswing with cable rope attachment 15# X 15 bilat Dead lifts 8 inch step to waist 2X10 with 20#, cues and demo for form and body mechanics Step ups on 8 inch step c single UE support x 15 bil LE leading Standing trunk extension: x 10 holding 3 sec Supine low trunk rotations 5 sec X 10 bilat Supine SKTC stretch 20 sec  X2 bilat Bridge c clam shell x 10 holding 5 sec Standing lumbar extensions X 10   06/13/23 TherEx Nustep: level 6 x 8 minutes Seated row machine 35# 2X15 Seated lat pulldown machine 35# 2X15 Leg Press: 100# bil LE 2 x 12 with slow eccentrics Dead lifts 8 inch step to waist 2X10 with 10#, cues and demo for form and body mechanics Step ups on 8 inch step c single UE support x 15 bil LE leading Standing trunk extension: x 10 holding 3 sec Supine low trunk rotations 5 sec X 10 bilat Supine SKTC stretch 15 sec  X3 bilat Bridge c clam shell x 10 holding 5 sec Prone hip extension 2X10 bilat  Manual Percussion to Rt QL    06/06/23 TherEx Nustep: level 6 x 7 minutes Calf raises: x 20 holding 3 sec Leg Press: 93# bil LE 2 x 12 with slow  eccentrics Dead lifts in lung position x 10 bil c 10# kettle bell , pt requiring technique correction and further tactile and verbal instructions Mini squats with 10# kettle  Step ups on 8 inch step c single UE support x 15 bil LE leading Standing trunk extension: x 10 holding 5 sec Bridge c clam shell x 15  Prone  hip extension x 20  Manual Percussion to Rt QL  STM and active trigger point release to lumbar paraspinals Pt instructed in self STM using tennis ball in standing against the wall      PATIENT EDUCATION:   Education details: HEP, POC, DN  Person educated: Patient Education method: Explanation, Demonstration, Verbal cues, and Handouts for HEP and DN Education comprehension: verbalized understanding, returned demonstration, and verbal cues required  HOME EXERCISE PROGRAM: Access Code: [email or web address] URL: [email or web address] Date: 05/09/2023 Prepared by: Chyrel Masson  Exercises - Supine Bridge  - 1-2 x daily - 7 x weekly - 2 sets - 10 reps - 5 seconds hold - Supine Lower Trunk Rotation  - 1-2 x daily - 7 x weekly - 3-5 reps - 10 seconds hold - Hooklying Single Knee to Chest Stretch  - 1-2 x daily - 7 x weekly - 3-5 sets - 10 seconds hold - Seated Hamstring Stretch  - 1-2 x daily - 7 x weekly - 3 reps - 20 seconds hold - Standing Lumbar Extension at Wall - Forearms  - 1-2 x daily - 7 x weekly - 5-10 reps - 10 seconds hold - Sidelying Lumbar Rotation Stretch  - 1 x daily - 7 x weekly - 5 reps - 1 sets - 15 hold  ASSESSMENT:  CLINICAL IMPRESSION: He has made good overall progress lately with PT, he has met 4/7 PT goals. PT recommending to continue with current PT treatment plan to work towards achieving remaining functional goals.       OBJECTIVE IMPAIRMENTS: decreased mobility, difficulty walking, decreased ROM, decreased strength, and pain.   ACTIVITY LIMITATIONS: lifting, bending, standing, and stairs  PARTICIPATION LIMITATIONS: driving and  community activity, sleeping  PERSONAL FACTORS: 3+ comorbidities: see pertinent history above  are also affecting patient's functional outcome.   REHAB POTENTIAL: Good  CLINICAL DECISION MAKING: Stable/uncomplicated  EVALUATION COMPLEXITY: Low   GOALS: Goals reviewed with patient? Yes  SHORT TERM GOALS: (target date for Short term goals are 3 weeks 06/01/23)  1. Patient will demonstrate independent use of home exercise program to maintain progress from in clinic treatments.  Goal status:  MET 05/30/23  LONG TERM GOALS: (target dates for all long term goals are 10 weeks  07/20/23 )   1. Patient will demonstrate/report pain at worst less than or equal to 2/10 to facilitate minimal limitation in daily activity secondary to pain symptoms.  Goal status: on-going 06/27/23   2. Patient will demonstrate independent use of home exercise program to facilitate ability to maintain/progress functional gains from skilled physical therapy services.  Goal status: ongoing on-going 06/27/23   3. Patient will demonstrate FOTO outcome > or = 68 % to indicate reduced disability due to condition.  Goal status: on-going 06/27/23   4. Patient will demonstrate lumbar extension 100 % WFL s symptoms to facilitate upright standing, walking posture at PLOF s limitation.  Goal status: MET 06/27/23   5.  Pt will be able to lift 20# from floor to counter height using correct body mechanics with no pain.   Goal status: on-going 06/27/23, is doing this from 8 inches from floor   6.  Pt will improve his hamstring flexibility to >/= 60 deg bilaterally to help improve overall functional mobility.  Goal status: MET 06/27/23, now has 72 deg   7. Pt will improve his bil trunk rotation to [facility] for return to golf.  Goal status:MET 06/27/23   PLAN:  PT FREQUENCY: 1-2x/week  PT DURATION: 10  weeks  PLANNED INTERVENTIONS: Therapeutic exercises, Therapeutic activity, Neuro Muscular re-education, Balance  training, Gait training, Patient/Family education, Joint mobilization, Stair training, DME instructions, Dry Needling, Electrical stimulation, Cryotherapy, vasopneumatic device, Moist heat, Taping, Traction Ultrasound, Ionotophoresis 4mg /ml Dexamethasone, and aquatic therapy, Manual therapy.  All included unless contraindicated  PLAN FOR NEXT SESSION: continue flexibility, hip/core strengthening, manual/modalities PRN   Ivery Quale, PT, DPT 06/27/23 11:47 AM

## 2023-06-29 ENCOUNTER — Encounter: Payer: Self-pay | Admitting: Physical Therapy

## 2023-06-29 ENCOUNTER — Ambulatory Visit: Payer: Medicare PPO | Admitting: Physical Therapy

## 2023-06-29 DIAGNOSIS — M6281 Muscle weakness (generalized): Secondary | ICD-10-CM

## 2023-06-29 DIAGNOSIS — M5459 Other low back pain: Secondary | ICD-10-CM | POA: Diagnosis not present

## 2023-06-29 DIAGNOSIS — R262 Difficulty in walking, not elsewhere classified: Secondary | ICD-10-CM

## 2023-06-29 NOTE — Therapy (Signed)
OUTPATIENT PHYSICAL THERAPY TREATMENT     Patient Name: Ernest Hughes. MRN: [id number] DOB:[birth year], 77 y.o., male Today's Date: 06/29/2023  END OF SESSION:  PT End of Session - 06/29/23 1408     Visit Number 11    Number of Visits 20    Date for PT Re-Evaluation 07/20/23    Authorization Type [employer]    Authorization Time Period -07/20/2023    PT Start Time 1400   pt late/front desk backed up   PT Stop Time 1428    PT Time Calculation (min) 28 min    Activity Tolerance Patient tolerated treatment well    Behavior During Therapy WFL for tasks assessed/performed                   Past Medical History:  Diagnosis Date   Arthritis    Asthma    as a baby   BPH (benign prostatic hyperplasia)    Cancer (HCC)    PROSTATE--treated with radiation   Diverticulosis    GERD (gastroesophageal reflux disease)    Hyperlipidemia    Hypertension    Past Surgical History:  Procedure Laterality Date   BELPHAROPTOSIS REPAIR  12/2013   bilateral   cataract surgery Left 2019   COLONOSCOPY     Dr. Jarold Motto   Patient Active Problem List   Diagnosis Date Noted   Lipoma of back 09/13/2022   New onset type 2 diabetes mellitus (HCC) 09/13/2022   Benign prostatic hyperplasia with nocturia 08/29/2021   History of arthroscopic knee surgery 11/18/2020   Arthritis 05/10/2020   History of cataract surgery, unspecified laterality 04/28/2019   Erectile dysfunction 04/28/2019   History of prostate cancer 05/22/2011   GERD 02/02/2009   Hyperlipidemia associated with type 2 diabetes mellitus (HCC) 02/01/2009   Hypertension associated with diabetes (HCC) 02/01/2009   Diverticulosis of colon 02/01/2009    PCP: Ronnald Nian, MD   REFERRING PROVIDER: Ronnald Nian, MD   REFERRING DIAG:  Diagnosis  M54.9 (ICD-10-CM) - Musculoskeletal back pain    Rationale for Evaluation and Treatment: Rehabilitation  THERAPY DIAG:  Other low back pain  Muscle weakness  (generalized)  Difficulty in walking, not elsewhere classified  ONSET DATE: "a while"  SUBJECTIVE:  SUBJECTIVE STATEMENT:  I was sitting out waiting for 8 minutes, the lady in the front has been on the phone and she said she will just take my payment later. Let's do a short session.   PERTINENT HISTORY:  DM, cancer (prostate treated with radiation), GERD, BPH, Diverticulosis, arthritis, asthma, HTN, hyperlipidemia, left meniscus surgery  PAIN:  NPRS scale: 4/10 Pain location: Rt sided low back and side  Pain description: ache but its gotten better  Aggravating factors: bending, prolonged standing, sitting Relieving factors: muscle relaxer, over the counter meds, walking  PRECAUTIONS: None  WEIGHT BEARING RESTRICTIONS: No  FALLS:  Has patient fallen in last 6 months? No  LIVING ENVIRONMENT: Lives with: lives with their family and lives with their spouse Lives in: House/apartment Stairs: 5 steps outside rail on both,  1 flight inside: rail on left Has following equipment at home: None  OCCUPATION: retired  PLOF: Independent  PATIENT GOALS: walk more get back to gym and playing golf  Next MD Visit: follow up as needed   OBJECTIVE:   DIAGNOSTIC FINDINGS: 12/14/2016 Scoliosis lumbar spine. Diffuse degenerative changes multilevel disc degeneration again noted. No change from prior exam. No acute bony abnormality identified. No evidence of fracture. Pelvic calcifications consistent with phleboliths.   IMPRESSION: Scoliosis lumbar spine. Diffuse degenerative change with multilevel disc degeneration again noted.  No change from prior exam. No acute abnormality identified.  PATIENT SURVEYS:  05/09/23: FOTO eval:    60% 06/27/23 FOTO decreased to 59% but he admits he answered the question about doing vigorous activities wrong the first day at intake, he rated at little difficulty and should have been a lot of difficulty.    SCREENING FOR RED FLAGS: Bowel or bladder incontinence: No Cauda equina syndrome: No  COGNITION: Overall cognitive status: WFL normal      SENSATION: WFL  MUSCLE LENGTH: Eval: Hamstrings: Right 58 deg; Left 45 deg     PALPATION: 05/09/23 TTP: Rt QL and bilateral lumbar paraspinals  LUMBAR ROM:   AROM 05/09/23 05/21/2023 06/04/23 06/27/23  Flexion 55 finger tips to above knees To ankles, no complaints noted.  Finger tips to top of ankles, no pain noted  WNL  Extension 15 75% WFL  75% WFL WNL  Right lateral flexion 22 To knee joint no complaints.     Left lateral flexion 20 To lateral epicondyle of femur.     Right rotation Limited 50%  WFL WNL  Left rotation Limited 25%  WFL WNL   (Blank rows = not tested)  LOWER EXTREMITY ROM:      Right 05/09/23 Supine active Left 05/09/23 Supine active Rt / Left 05/28/23 Supine  active  Hip flexion 100 98 110 / 106  Hip extension     Hip abduction     Hip adduction     Hip internal rotation     Hip external rotation     Knee flexion 120 125   Knee extension     Ankle dorsiflexion     Ankle plantarflexion     Ankle inversion     Ankle eversion      (Blank rows = not tested)  LOWER EXTREMITY MMT:    MMT Right 05/09/23 Left 05/09/23 Rt/Lt 06/27/23  Hip flexion 4 4 5/5  Hip extension     Hip abduction     Hip adduction 5 5   Hip internal rotation     Hip external rotation     Knee flexion 5 5   Knee  extension 5 5   Ankle dorsiflexion     Ankle plantarflexion     Ankle inversion     Ankle eversion      (Blank rows = not tested)  LUMBAR SPECIAL TESTS:  05/09/23 Slump test: Negative  bilaterally  FUNCTIONAL TESTS:  05/09/23 5 times sit to stand: 17.8 seconds UE support 05/28/23:  5 times sit to stand: 10.56 seconds no UE support 06/06/23:  5 times sit to stand: 9.8 seconds No UE support   GAIT: 05/09/23 Distance walked: clinic distance Assistive device utilized: None Level of assistance: Complete Independence Comments: forward flexed at hips and head                                                                                                                                                                                                                 TODAY'S TREATMENT: DATE:   06/29/23  TherEx  DLs to 8 inch box 78# G95 good form today Standing lumbar extensions on wall x15 Rows 35# x15, 45# x10 Good mornings 10# KB 2x7 Lat pulls 35# x15 KB reverse chops with trunk rotation/pivot to further mimic golf swing x10 B- tried 15# KB, dropped to 10# due to some back discomfort  QL stretches 3 way, 2x30 seconds each (6 total)  Lumbar rotation stretch 5x10 seconds B     06/27/23 TherEx Recumbent bike L3 X 9 minutes Seated row machine 35# 2X15 Seated lat pulldown machine 35# 2X15 Leg Press: 100# bil LE 2 x 15 with slow eccentrics Diagonal chops/golf downswing with cable rope attachment 15# X 15 bilat Dead lifts 8 inch step to waist 2X10 with 20#, cues and demo for form and body mechanics Step ups on 8 inch step c single UE support x 15 bil LE leading Standing trunk extension: x 10 holding 3 sec Supine low trunk rotations 5 sec X 10 bilat Supine SKTC stretch 20 sec  X2 bilat Bridge c clam shell x 10 holding 5 sec Standing lumbar extensions X 10   06/13/23 TherEx Nustep: level 6 x 8 minutes Seated row machine 35# 2X15 Seated lat pulldown machine 35# 2X15 Leg Press: 100# bil LE 2 x 12 with slow eccentrics Dead lifts 8 inch step to waist 2X10 with 10#, cues and demo for form and body mechanics Step ups on 8 inch step c single UE support x 15 bil LE  leading Standing trunk extension: x 10 holding 3 sec Supine low trunk rotations 5 sec X 10 bilat Supine SKTC stretch 15  sec  X3 bilat Bridge c clam shell x 10 holding 5 sec Prone hip extension 2X10 bilat  Manual Percussion to Rt QL    06/06/23 TherEx Nustep: level 6 x 7 minutes Calf raises: x 20 holding 3 sec Leg Press: 93# bil LE 2 x 12 with slow eccentrics Dead lifts in lung position x 10 bil c 10# kettle bell , pt requiring technique correction and further tactile and verbal instructions Mini squats with 10# kettle  Step ups on 8 inch step c single UE support x 15 bil LE leading Standing trunk extension: x 10 holding 5 sec Bridge c clam shell x 15  Prone hip extension x 20  Manual Percussion to Rt QL  STM and active trigger point release to lumbar paraspinals Pt instructed in self STM using tennis ball in standing against the wall      PATIENT EDUCATION:   Education details: HEP, POC, DN  Person educated: Patient Education method: Explanation, Demonstration, Verbal cues, and Handouts for HEP and DN Education comprehension: verbalized understanding, returned demonstration, and verbal cues required  HOME EXERCISE PROGRAM: Access Code: [email or web address] URL: [email or web address] Date: 05/09/2023 Prepared by: Chyrel Masson  Exercises - Supine Bridge  - 1-2 x daily - 7 x weekly - 2 sets - 10 reps - 5 seconds hold - Supine Lower Trunk Rotation  - 1-2 x daily - 7 x weekly - 3-5 reps - 10 seconds hold - Hooklying Single Knee to Chest Stretch  - 1-2 x daily - 7 x weekly - 3-5 sets - 10 seconds hold - Seated Hamstring Stretch  - 1-2 x daily - 7 x weekly - 3 reps - 20 seconds hold - Standing Lumbar Extension at Wall - Forearms  - 1-2 x daily - 7 x weekly - 5-10 reps - 10 seconds hold - Sidelying Lumbar Rotation Stretch  - 1 x daily - 7 x weekly - 5 reps - 1 sets - 15 hold  ASSESSMENT:  CLINICAL IMPRESSION:  Pt arrives doing well but frustrated due to traffic  delay that made him late for appt- offered rescheduling vs short session, agreeable to short visit today with focus on exercise, thus deferred percussion tx today. Continued focus on functional strengthening with good biomechanics and postural training as time allowed, progressions as able. Continue POC.       OBJECTIVE IMPAIRMENTS: decreased mobility, difficulty walking, decreased ROM, decreased strength, and pain.   ACTIVITY LIMITATIONS: lifting, bending, standing, and stairs  PARTICIPATION LIMITATIONS: driving and community activity, sleeping  PERSONAL FACTORS: 3+ comorbidities: see pertinent history above  are also affecting patient's functional outcome.   REHAB POTENTIAL: Good  CLINICAL DECISION MAKING: Stable/uncomplicated  EVALUATION COMPLEXITY: Low   GOALS: Goals reviewed with patient? Yes  SHORT TERM GOALS: (target date for Short term goals are 3 weeks 06/01/23)  1. Patient will demonstrate independent use of home exercise program to maintain progress from in clinic treatments.  Goal status:  MET 05/30/23  LONG TERM GOALS: (target dates for all long term goals are 10 weeks  07/20/23 )   1. Patient will demonstrate/report pain at worst less than or equal to 2/10 to facilitate minimal limitation in daily activity secondary to pain symptoms.  Goal status: on-going 06/27/23   2. Patient will demonstrate independent use of home exercise program to facilitate ability to maintain/progress functional gains from skilled physical therapy services.  Goal status: ongoing on-going 06/27/23   3. Patient will demonstrate FOTO outcome > or = 68 % to  indicate reduced disability due to condition.  Goal status: on-going 06/27/23   4. Patient will demonstrate lumbar extension 100 % WFL s symptoms to facilitate upright standing, walking posture at PLOF s limitation.  Goal status: MET 06/27/23   5.  Pt will be able to lift 20# from floor to counter height using correct body  mechanics with no pain.   Goal status: on-going 06/27/23, is doing this from 8 inches from floor   6.  Pt will improve his hamstring flexibility to >/= 60 deg bilaterally to help improve overall functional mobility.  Goal status: MET 06/27/23, now has 72 deg   7. Pt will improve his bil trunk rotation to [facility] for return to golf.  Goal status:MET 06/27/23   PLAN:  PT FREQUENCY: 1-2x/week  PT DURATION: 10 weeks  PLANNED INTERVENTIONS: Therapeutic exercises, Therapeutic activity, Neuro Muscular re-education, Balance training, Gait training, Patient/Family education, Joint mobilization, Stair training, DME instructions, Dry Needling, Electrical stimulation, Cryotherapy, vasopneumatic device, Moist heat, Taping, Traction Ultrasound, Ionotophoresis 4mg /ml Dexamethasone, and aquatic therapy, Manual therapy.  All included unless contraindicated  PLAN FOR NEXT SESSION: continue flexibility, hip/core strengthening, manual/modalities PRN. Does he need HEP updates? Talk about transition to gym  Nedra Hai, PT, DPT 06/29/23 2:41 PM

## 2023-07-02 ENCOUNTER — Encounter: Payer: Self-pay | Admitting: Physical Therapy

## 2023-07-02 ENCOUNTER — Ambulatory Visit: Payer: Medicare PPO | Admitting: Physical Therapy

## 2023-07-02 DIAGNOSIS — M5459 Other low back pain: Secondary | ICD-10-CM

## 2023-07-02 DIAGNOSIS — M6281 Muscle weakness (generalized): Secondary | ICD-10-CM | POA: Diagnosis not present

## 2023-07-02 DIAGNOSIS — R262 Difficulty in walking, not elsewhere classified: Secondary | ICD-10-CM | POA: Diagnosis not present

## 2023-07-02 NOTE — Therapy (Addendum)
OUTPATIENT PHYSICAL THERAPY TREATMENT Re-certification      Patient Name: Ernest Hughes. MRN: [id number] DOB:05/07/1946, 77 y.o., male Today's Date: 07/02/2023  END OF SESSION:  PT End of Session - 07/02/23 0947     Visit Number 12    Number of Visits 20    Authorization Type [employer] resubmitted on 07/02/23 for 6 more visit    Authorization - Visit Number 12    Authorization - Number of Visits 12    PT Start Time 0930    PT Stop Time 1014    PT Time Calculation (min) 44 min    Activity Tolerance Patient tolerated treatment well    Behavior During Therapy WFL for tasks assessed/performed                   Past Medical History:  Diagnosis Date   Arthritis    Asthma    as a baby   BPH (benign prostatic hyperplasia)    Cancer (HCC)    PROSTATE--treated with radiation   Diverticulosis    GERD (gastroesophageal reflux disease)    Hyperlipidemia    Hypertension    Past Surgical History:  Procedure Laterality Date   BELPHAROPTOSIS REPAIR  12/2013   bilateral   cataract surgery Left 2019   COLONOSCOPY     Dr. Jarold Motto   Patient Active Problem List   Diagnosis Date Noted   Lipoma of back 09/13/2022   New onset type 2 diabetes mellitus (HCC) 09/13/2022   Benign prostatic hyperplasia with nocturia 08/29/2021   History of arthroscopic knee surgery 11/18/2020   Arthritis 05/10/2020   History of cataract surgery, unspecified laterality 04/28/2019   Erectile dysfunction 04/28/2019   History of prostate cancer 05/22/2011   GERD 02/02/2009   Hyperlipidemia associated with type 2 diabetes mellitus (HCC) 02/01/2009   Hypertension associated with diabetes (HCC) 02/01/2009   Diverticulosis of colon 02/01/2009    PCP: Ronnald Nian, MD   REFERRING PROVIDER: Ronnald Nian, MD   REFERRING DIAG:  Diagnosis  M54.9 (ICD-10-CM) - Musculoskeletal back pain    Rationale for Evaluation and Treatment: Rehabilitation  THERAPY DIAG:  Other low back  pain  Muscle weakness (generalized)  Difficulty in walking, not elsewhere classified  ONSET DATE: "a while"  SUBJECTIVE:  SUBJECTIVE STATEMENT: Pt arriving today still reporting 3-4/10 pain in his Rt side low back. Pt reporting pain down his Rt low back/flank with certain movements and actives.   PERTINENT HISTORY:  DM, cancer (prostate treated with radiation), GERD, BPH, Diverticulosis, arthritis, asthma, HTN, hyperlipidemia, left meniscus surgery  PAIN:  NPRS scale: 4/10 Pain location: Rt sided low back and side  Pain description: ache but its gotten better  Aggravating factors: bending, prolonged standing, sitting Relieving factors: muscle relaxer, over the counter meds, walking  PRECAUTIONS: None  WEIGHT BEARING RESTRICTIONS: No  FALLS:  Has patient fallen in last 6 months? No  LIVING ENVIRONMENT: Lives with: lives with their family and lives with their spouse Lives in: House/apartment Stairs: 5 steps outside rail on both,  1 flight inside: rail on left Has following equipment at home: None  OCCUPATION: retired  PLOF: Independent  PATIENT GOALS: walk more get back to gym and playing golf  Next MD Visit: follow up as needed   OBJECTIVE:   DIAGNOSTIC FINDINGS: 12/14/2016 Scoliosis lumbar spine. Diffuse degenerative changes multilevel disc degeneration again noted. No change from prior exam. No acute bony abnormality identified. No evidence of fracture. Pelvic calcifications consistent with phleboliths.   IMPRESSION: Scoliosis lumbar spine. Diffuse degenerative change with multilevel disc degeneration  again noted. No change from prior exam. No acute abnormality identified.  PATIENT SURVEYS:  05/09/23: FOTO eval:  60% 06/27/23 FOTO decreased to 59% but he admits he answered the question about doing vigorous activities wrong the first day at intake, he rated at little difficulty and should have been a lot of difficulty.    SCREENING FOR RED FLAGS: Bowel or bladder incontinence: No Cauda equina syndrome: No  COGNITION: Overall cognitive status: WFL normal      SENSATION: WFL  MUSCLE LENGTH: Eval: Hamstrings: Right 58 deg; Left 45 deg     PALPATION: 05/09/23 TTP: Rt QL and bilateral lumbar paraspinals  LUMBAR ROM:   AROM 05/09/23 05/21/2023 06/04/23 06/27/23 07/02/23  Flexion 55 finger tips to above knees To ankles, no complaints noted.  Finger tips to top of ankles, no pain noted  WNL WFL Finger tips 4 inches above ankles  Extension 15 75% WFL  75% WFL WNL 35  Right lateral flexion 22 To knee joint no complaints.    35  Left lateral flexion 20 To lateral epicondyle of femur.    32  Right rotation Limited 50%  WFL WNL WFL  Left rotation Limited 25%  WFL WNL WFL   (Blank rows = not tested)  LOWER EXTREMITY ROM:      Right 05/09/23 Supine active Left 05/09/23 Supine active Rt / Left 05/28/23 Supine  active Rt / Left 05/28/23 Supine  active  Hip flexion 100 98 110 / 106 110/ 108  Hip extension      Hip abduction      Hip adduction      Hip internal rotation      Hip external rotation      Knee flexion 120 125    Knee extension      Ankle dorsiflexion      Ankle plantarflexion      Ankle inversion      Ankle eversion       (Blank rows = not tested)  LOWER EXTREMITY MMT:    MMT Right 05/09/23 Left 05/09/23 Rt/Lt 06/27/23 Rt / Left 07/02/23  Hip flexion 4 4 5/5 5 / 5  Hip extension      Hip abduction  4 / 4  Hip adduction 5 5  5  / 5  Hip internal rotation      Hip external rotation      Knee flexion 5 5  5  / 5  Knee extension 5 5  5  / 5  Ankle  dorsiflexion      Ankle plantarflexion      Ankle inversion      Ankle eversion       (Blank rows = not tested)  LUMBAR SPECIAL TESTS:  05/09/23 Slump test: Negative bilaterally  FUNCTIONAL TESTS:  05/09/23 5 times sit to stand: 17.8 seconds UE support 05/28/23:  5 times sit to stand: 10.56 seconds no UE support 06/06/23:  5 times sit to stand: 9.8 seconds No UE support   GAIT: 05/09/23 Distance walked: clinic distance Assistive device utilized: None Level of assistance: Complete Independence Comments: forward flexed at hips and head                                                                                                                                                                                                                 TODAY'S TREATMENT: DATE:  07/02/23 TherEx Scifit  bike L3.5 X 8 minutes Hamstirng stretches: x 3 bil LE holding 30 sec Standing balance in parallel bars on Airex beam x 2 holding 60 sec with SBA Leg press: 100# bil LE 2 x 15  Leg Press: single LE 50# x 10 bil  Supine trunk rotation: x 3 bil holding 30 sec QL door stretch: x 3 holding 20 sec  -updated all pt's ROM      06/29/23  TherEx  DLs to 8 inch box 40# J81 good form today Standing lumbar extensions on wall x15 Rows 35# x15, 45# x10 Good mornings 10# KB 2x7 Lat pulls 35# x15 KB reverse chops with trunk rotation/pivot to further mimic golf swing x10 B- tried 15# KB, dropped to 10# due to some back discomfort  QL stretches 3 way, 2x30 seconds each (6 total)  Lumbar rotation stretch 5x10 seconds B     06/27/23 TherEx Recumbent bike L3 X 9 minutes Seated row machine 35# 2X15 Seated lat pulldown machine 35# 2X15 Leg Press: 100# bil LE 2 x 15 with slow eccentrics Diagonal chops/golf downswing with cable rope attachment 15# X 15 bilat Dead lifts 8 inch step to waist 2X10 with 20#, cues and demo for form and body mechanics Step ups on 8 inch step c single UE support x 15 bil  LE leading Standing trunk  extension: x 10 holding 3 sec Supine low trunk rotations 5 sec X 10 bilat Supine SKTC stretch 20 sec  X2 bilat Bridge c clam shell x 10 holding 5 sec Standing lumbar extensions X 10   06/13/23 TherEx Nustep: level 6 x 8 minutes Seated row machine 35# 2X15 Seated lat pulldown machine 35# 2X15 Leg Press: 100# bil LE 2 x 12 with slow eccentrics Dead lifts 8 inch step to waist 2X10 with 10#, cues and demo for form and body mechanics Step ups on 8 inch step c single UE support x 15 bil LE leading Standing trunk extension: x 10 holding 3 sec Supine low trunk rotations 5 sec X 10 bilat Supine SKTC stretch 15 sec  X3 bilat Bridge c clam shell x 10 holding 5 sec Prone hip extension 2X10 bilat  Manual Percussion to Rt QL       PATIENT EDUCATION:   Education details: HEP, POC, DN  Person educated: Patient Education method: Programmer, multimedia, Demonstration, Verbal cues, and Handouts for HEP and DN Education comprehension: verbalized understanding, returned demonstration, and verbal cues required  HOME EXERCISE PROGRAM: Access Code: [email or web address] URL: [email or web address] Date: 05/09/2023 Prepared by: Chyrel Masson  Exercises - Supine Bridge  - 1-2 x daily - 7 x weekly - 2 sets - 10 reps - 5 seconds hold - Supine Lower Trunk Rotation  - 1-2 x daily - 7 x weekly - 3-5 reps - 10 seconds hold - Hooklying Single Knee to Chest Stretch  - 1-2 x daily - 7 x weekly - 3-5 sets - 10 seconds hold - Seated Hamstring Stretch  - 1-2 x daily - 7 x weekly - 3 reps - 20 seconds hold - Standing Lumbar Extension at Wall - Forearms  - 1-2 x daily - 7 x weekly - 5-10 reps - 10 seconds hold - Sidelying Lumbar Rotation Stretch  - 1 x daily - 7 x weekly - 5 reps - 1 sets - 15 hold  ASSESSMENT:  CLINICAL IMPRESSION: Pt feels he has made progress since beginning physical therapy. Pt however still reporting pain in his Rt side low back which is now mostly concentrated to Rt  Quadratus Lumborum and lumbar paraspinals. Pt has improved his LE strength and still needs focusing on Rt hip abductors for improved functional mobility.  I am requesting 6 additional visits in order for pt to maximize his function and meet all LTG's set.       OBJECTIVE IMPAIRMENTS: decreased mobility, difficulty walking, decreased ROM, decreased strength, and pain.   ACTIVITY LIMITATIONS: lifting, bending, standing, and stairs  PARTICIPATION LIMITATIONS: driving and community activity, sleeping  PERSONAL FACTORS: 3+ comorbidities: see pertinent history above  are also affecting patient's functional outcome.   REHAB POTENTIAL: Good  CLINICAL DECISION MAKING: Stable/uncomplicated  EVALUATION COMPLEXITY: Low   GOALS: Goals reviewed with patient? Yes  SHORT TERM GOALS: (target date for Short term goals are 3 weeks 06/01/23)  1. Patient will demonstrate independent use of home exercise program to maintain progress from in clinic treatments.  Goal status:  MET 05/30/23  LONG TERM GOALS: (target date 07/27/23 )   1. Patient will demonstrate/report pain at worst less than or equal to 2/10 to facilitate minimal limitation in daily activity secondary to pain symptoms.  Goal status: on-going 06/27/23   2. Patient will demonstrate independent use of home exercise program to facilitate ability to maintain/progress functional gains from skilled physical therapy services.  Goal status: ongoing on-going 06/27/23   3. Patient will  demonstrate FOTO outcome > or = 68 % to indicate reduced disability due to condition.  Goal status: on-going 06/27/23   4. Patient will demonstrate lumbar extension 100 % WFL s symptoms to facilitate upright standing, walking posture at PLOF s limitation.  Goal status: MET 06/27/23   5.  Pt will be able to lift 20# from floor to counter height using correct body mechanics with no pain.   Goal status: on-going 06/27/23, is doing this from 8 inches from  floor   6.  Pt will improve his hamstring flexibility to >/= 60 deg bilaterally to help improve overall functional mobility.  Goal status: MET 06/27/23, now has 72 deg   7. Pt will improve his bil trunk rotation to [facility] for return to golf.  Goal status:MET 06/27/23   PLAN:  PT FREQUENCY: 1-2x/week  PT DURATION: 3 weeks (07/27/23)  PLANNED INTERVENTIONS: Therapeutic exercises, Therapeutic activity, Neuro Muscular re-education, Balance training, Gait training, Patient/Family education, Joint mobilization, Stair training, DME instructions, Dry Needling, Electrical stimulation, Cryotherapy, vasopneumatic device, Moist heat, Taping, Traction Ultrasound, Ionotophoresis 4mg /ml Dexamethasone, and aquatic therapy, Manual therapy.  All included unless contraindicated  PLAN FOR NEXT SESSION: continue flexibility, hip/core strengthening focus on hip abd, manual/modalities PRN, Begin transition to gym based program.   Narda Amber, PT, MPT 07/02/23 10:02 AM   07/02/23 10:02 AM   Referring diagnosis? M54.9 Treatment diagnosis? (if different than referring diagnosis) M54.9, M62.81, R26.2 What was this (referring dx) caused by? []  Surgery []  Fall [x]  Ongoing issue [x]  Arthritis []  Other: ____________   Laterality: []  Rt []  Lt []  Both   Check all possible CPT codes:                   *CHOOSE 10 OR LESS*                                [x]  97110 (Therapeutic Exercise)                   []  19147 (SLP Treatment)      [x]  97112 (Neuro Re-ed)                                []  92526 (Swallowing Treatment)                  [x]  97116 (Gait Training)                                []  [id number] (Cognitive Training, 1st 15 minutes) [x]  97140 (Manual Therapy)                           []  97130 (Cognitive Training, each add'l 15 minutes)         []  97164 (Re-evaluation)                              []  Other, List CPT Code ____________  [x]  97530 (Therapeutic Activities)                                             [x]  97535 (Self Care)               []   All codes above (97110 - 97535)             []  97012 (Mechanical Traction)             [x]  97014 (E-stim Unattended)             []  97032 (E-stim manual)             []  84696 (Ionto)             []  97035 (Ultrasound) [x]  97750 (Physical Performance Training) []  [id number] (Aquatic Therapy) []  97016 (Vasopneumatic Device) []  [id number] (Paraffin) []  97034 (Contrast Bath) []  97597 (Wound Care 1st 20 sq cm) []  97598 (Wound Care each add'l 20 sq cm) []  97760 (Orthotic Fabrication, Fitting, Training Initial) []  [id number] (Prosthetic Management and Training Initial) []  [id number] (Orthotic or Prosthetic Training/ Modification Subsequent)

## 2023-07-02 NOTE — Addendum Note (Signed)
Addended by: Narda Amber R on: 07/02/2023 12:22 PM   Modules accepted: Orders

## 2023-07-03 ENCOUNTER — Encounter: Payer: Medicare PPO | Admitting: Physical Therapy

## 2023-07-05 ENCOUNTER — Encounter: Payer: Self-pay | Admitting: Physical Therapy

## 2023-07-05 ENCOUNTER — Ambulatory Visit: Payer: Medicare PPO | Admitting: Physical Therapy

## 2023-07-05 DIAGNOSIS — M5459 Other low back pain: Secondary | ICD-10-CM

## 2023-07-05 DIAGNOSIS — R262 Difficulty in walking, not elsewhere classified: Secondary | ICD-10-CM

## 2023-07-05 DIAGNOSIS — M6281 Muscle weakness (generalized): Secondary | ICD-10-CM

## 2023-07-05 NOTE — Therapy (Addendum)
OUTPATIENT PHYSICAL THERAPY TREATMENT   Patient Name: Ernest Hughes. MRN: [id number] DOB:15-Sep-1945, 77 y.o., male Today's Date: 07/05/2023  END OF SESSION:  PT End of Session - 07/05/23 1059     Visit Number 13    Number of Visits 20    Date for PT Re-Evaluation 07/27/23    Authorization Type 6 more visits until 12/30    Authorization Time Period 07/27/23    Authorization - Visit Number 1    Authorization - Number of Visits 6    Progress Note Due on Visit --   at discharge   PT Start Time 1047    PT Stop Time 1132    PT Time Calculation (min) 45 min    Activity Tolerance Patient tolerated treatment well    Behavior During Therapy WFL for tasks assessed/performed                   Past Medical History:  Diagnosis Date   Arthritis    Asthma    as a baby   BPH (benign prostatic hyperplasia)    Cancer (HCC)    PROSTATE--treated with radiation   Diverticulosis    GERD (gastroesophageal reflux disease)    Hyperlipidemia    Hypertension    Past Surgical History:  Procedure Laterality Date   BELPHAROPTOSIS REPAIR  12/2013   bilateral   cataract surgery Left 2019   COLONOSCOPY     Dr. Jarold Motto   Patient Active Problem List   Diagnosis Date Noted   Lipoma of back 09/13/2022   New onset type 2 diabetes mellitus (HCC) 09/13/2022   Benign prostatic hyperplasia with nocturia 08/29/2021   History of arthroscopic knee surgery 11/18/2020   Arthritis 05/10/2020   History of cataract surgery, unspecified laterality 04/28/2019   Erectile dysfunction 04/28/2019   History of prostate cancer 05/22/2011   GERD 02/02/2009   Hyperlipidemia associated with type 2 diabetes mellitus (HCC) 02/01/2009   Hypertension associated with diabetes (HCC) 02/01/2009   Diverticulosis of colon 02/01/2009    PCP: Ronnald Nian, MD   REFERRING PROVIDER: Ronnald Nian, MD   REFERRING DIAG:  Diagnosis  M54.9 (ICD-10-CM) - Musculoskeletal back pain    Rationale for  Evaluation and Treatment: Rehabilitation  THERAPY DIAG:  Other low back pain  Muscle weakness (generalized)  Difficulty in walking, not elsewhere classified  ONSET DATE: "a while"  SUBJECTIVE:  SUBJECTIVE STATEMENT: Pt arriving today still reporting 3-4/10 pain in his Rt side low back.  PERTINENT HISTORY:  DM, cancer (prostate treated with radiation), GERD, BPH, Diverticulosis, arthritis, asthma, HTN, hyperlipidemia, left meniscus surgery  PAIN:  NPRS scale: 3-4/10 Pain location: Rt sided low back and side  Pain description: ache but its gotten better  Aggravating factors: bending, prolonged standing, sitting Relieving factors: muscle relaxer, over the counter meds, walking  PRECAUTIONS: None  WEIGHT BEARING RESTRICTIONS: No  FALLS:  Has patient fallen in last 6 months? No  LIVING ENVIRONMENT: Lives with: lives with their family and lives with their spouse Lives in: House/apartment Stairs: 5 steps outside rail on both,  1 flight inside: rail on left Has following equipment at home: None  OCCUPATION: retired  PLOF: Independent  PATIENT GOALS: walk more get back to gym and playing golf  Next MD Visit: follow up as needed   OBJECTIVE:   DIAGNOSTIC FINDINGS: 12/14/2016 Scoliosis lumbar spine. Diffuse degenerative changes multilevel disc degeneration again noted. No change from prior exam. No acute bony abnormality identified. No evidence of fracture. Pelvic calcifications consistent with phleboliths.   IMPRESSION: Scoliosis lumbar spine. Diffuse degenerative change with multilevel disc degeneration again  noted. No change from prior exam. No acute abnormality identified.  PATIENT SURVEYS:  05/09/23: FOTO eval:  60% 06/27/23 FOTO decreased to 59% but he admits he answered the question about doing vigorous activities wrong the first day at intake, he rated at little difficulty and should have been a lot of difficulty.    SCREENING FOR RED FLAGS: Bowel or bladder incontinence: No Cauda equina syndrome: No  COGNITION: Overall cognitive status: WFL normal      SENSATION: WFL  MUSCLE LENGTH: Eval: Hamstrings: Right 58 deg; Left 45 deg     PALPATION: 05/09/23 TTP: Rt QL and bilateral lumbar paraspinals  LUMBAR ROM:   AROM 05/09/23 05/21/2023 06/04/23 06/27/23 07/02/23  Flexion 55 finger tips to above knees To ankles, no complaints noted.  Finger tips to top of ankles, no pain noted  WNL WFL Finger tips 4 inches above ankles  Extension 15 75% WFL  75% WFL WNL 35  Right lateral flexion 22 To knee joint no complaints.    35  Left lateral flexion 20 To lateral epicondyle of femur.    32  Right rotation Limited 50%  WFL WNL WFL  Left rotation Limited 25%  WFL WNL WFL   (Blank rows = not tested)  LOWER EXTREMITY ROM:      Right 05/09/23 Supine active Left 05/09/23 Supine active Rt / Left 05/28/23 Supine  active Rt / Left 05/28/23 Supine  active  Hip flexion 100 98 110 / 106 110/ 108  Hip extension      Hip abduction      Hip adduction      Hip internal rotation      Hip external rotation      Knee flexion 120 125    Knee extension      Ankle dorsiflexion      Ankle plantarflexion      Ankle inversion      Ankle eversion       (Blank rows = not tested)  LOWER EXTREMITY MMT:    MMT Right 05/09/23 Left 05/09/23 Rt/Lt 06/27/23 Rt / Left 07/02/23  Hip flexion 4 4 5/5 5 / 5  Hip extension      Hip abduction    4 / 4  Hip adduction 5 5  5  /  5  Hip internal rotation      Hip external rotation      Knee flexion 5 5  5  / 5  Knee extension 5 5  5  / 5  Ankle  dorsiflexion      Ankle plantarflexion      Ankle inversion      Ankle eversion       (Blank rows = not tested)  LUMBAR SPECIAL TESTS:  05/09/23 Slump test: Negative bilaterally  FUNCTIONAL TESTS:  05/09/23 5 times sit to stand: 17.8 seconds UE support 05/28/23:  5 times sit to stand: 10.56 seconds no UE support 06/06/23:  5 times sit to stand: 9.8 seconds No UE support   GAIT: 05/09/23 Distance walked: clinic distance Assistive device utilized: None Level of assistance: Complete Independence Comments: forward flexed at hips and head                                                                                                                                                                                                                 TODAY'S TREATMENT: DATE:  07/05/23 TherEx Recumbent bike L3 X10 min Hamstirng stretches: x 3 bil LE holding 30 sec Tandem walk on  Airex beam x 5 with intermittent UE support PRN Lateral walk on  Airex beam x 5 with intermittent UE support PRN Leg press: 100# bil LE 2 x 15  Leg Press: single LE 50# x 15 bil  Seated row machine 35# 2X15 Seated lat pulldown machine 35# 2X10 Dead lifts from 6 inch inch step to waist 2X10 with 20#, cues and demo for form and body mechanics Step ups on 6 inch step without UE support x 10 bil LE leading Supine low trunk rotations 10 sec X 5 bilat Supine SKTC stretch 20 sec  X2 bilat Bridge c clam shell with green band x 10 holding 5 sec QL door stretch: x 3 holding 20 sec  07/02/23 TherEx Scifit  bike L3.5 X 8 minutes Hamstirng stretches: x 3 bil LE holding 30 sec Standing balance in parallel bars on Airex beam x 2 holding 60 sec with SBA Leg press: 100# bil LE 2 x 15  Leg Press: single LE 50# x 10 bil  Supine trunk rotation: x 3 bil holding 30 sec QL door stretch: x 3 holding 20 sec  -updated all pt's ROM      06/29/23  TherEx  DLs to 8 inch box 16# X09 good form today Standing lumbar  extensions on wall x15 Rows 35# x15,  45# x10 Good mornings 10# KB 2x7 Lat pulls 35# x15 KB reverse chops with trunk rotation/pivot to further mimic golf swing x10 B- tried 15# KB, dropped to 10# due to some back discomfort  QL stretches 3 way, 2x30 seconds each (6 total)  Lumbar rotation stretch 5x10 seconds B       PATIENT EDUCATION:   Education details: HEP, POC, DN  Person educated: Patient Education method: Explanation, Demonstration, Verbal cues, and Handouts for HEP and DN Education comprehension: verbalized understanding, returned demonstration, and verbal cues required  HOME EXERCISE PROGRAM: Access Code: [email or web address] URL: [email or web address] Date: 05/09/2023 Prepared by: Chyrel Masson  Exercises - Supine Bridge  - 1-2 x daily - 7 x weekly - 2 sets - 10 reps - 5 seconds hold - Supine Lower Trunk Rotation  - 1-2 x daily - 7 x weekly - 3-5 reps - 10 seconds hold - Hooklying Single Knee to Chest Stretch  - 1-2 x daily - 7 x weekly - 3-5 sets - 10 seconds hold - Seated Hamstring Stretch  - 1-2 x daily - 7 x weekly - 3 reps - 20 seconds hold - Standing Lumbar Extension at Wall - Forearms  - 1-2 x daily - 7 x weekly - 5-10 reps - 10 seconds hold - Sidelying Lumbar Rotation Stretch  - 1 x daily - 7 x weekly - 5 reps - 1 sets - 15 hold  ASSESSMENT:  CLINICAL IMPRESSION: Continued to work to improve strength and ROM to improve overall function and work toward his PT goals. He had good response to this and will try to not take any meds prior to next visit.   OBJECTIVE IMPAIRMENTS: decreased mobility, difficulty walking, decreased ROM, decreased strength, and pain.   ACTIVITY LIMITATIONS: lifting, bending, standing, and stairs  PARTICIPATION LIMITATIONS: driving and community activity, sleeping  PERSONAL FACTORS: 3+ comorbidities: see pertinent history above  are also affecting patient's functional outcome.   REHAB POTENTIAL: Good  CLINICAL DECISION MAKING:  Stable/uncomplicated  EVALUATION COMPLEXITY: Low   GOALS: Goals reviewed with patient? Yes  SHORT TERM GOALS: (target date for Short term goals are 3 weeks 06/01/23)  1. Patient will demonstrate independent use of home exercise program to maintain progress from in clinic treatments.  Goal status:  MET 05/30/23  LONG TERM GOALS: (target date 07/27/23 )   1. Patient will demonstrate/report pain at worst less than or equal to 2/10 to facilitate minimal limitation in daily activity secondary to pain symptoms.  Goal status: on-going 06/27/23   2. Patient will demonstrate independent use of home exercise program to facilitate ability to maintain/progress functional gains from skilled physical therapy services.  Goal status: ongoing on-going 06/27/23   3. Patient will demonstrate FOTO outcome > or = 68 % to indicate reduced disability due to condition.  Goal status: on-going 06/27/23   4. Patient will demonstrate lumbar extension 100 % WFL s symptoms to facilitate upright standing, walking posture at PLOF s limitation.  Goal status: MET 06/27/23   5.  Pt will be able to lift 20# from floor to counter height using correct body mechanics with no pain.   Goal status: on-going 06/27/23, is doing this from 8 inches from floor   6.  Pt will improve his hamstring flexibility to >/= 60 deg bilaterally to help improve overall functional mobility.  Goal status: MET 06/27/23, now has 72 deg   7. Pt will improve his bil trunk rotation to [facility] for return to golf.  Goal status:MET  06/27/23   PLAN:  PT FREQUENCY: 1-2x/week  PT DURATION: 3 weeks (07/27/23)  PLANNED INTERVENTIONS: Therapeutic exercises, Therapeutic activity, Neuro Muscular re-education, Balance training, Gait training, Patient/Family education, Joint mobilization, Stair training, DME instructions, Dry Needling, Electrical stimulation, Cryotherapy, vasopneumatic device, Moist heat, Taping, Traction Ultrasound, Ionotophoresis  4mg /ml Dexamethasone, and aquatic therapy, Manual therapy.  All included unless contraindicated  PLAN FOR NEXT SESSION: continue flexibility, hip/core strengthening focus on hip abd, manual/modalities PRN, Begin transition to gym based program.   Ivery Quale, PT, DPT 07/05/23 11:34 AM    Referring diagnosis? M54.9 Treatment diagnosis? (if different than referring diagnosis) M54.9, M62.81, R26.2 What was this (referring dx) caused by? []  Surgery []  Fall [x]  Ongoing issue [x]  Arthritis []  Other: ____________   Laterality: []  Rt []  Lt []  Both   Check all possible CPT codes:                   *CHOOSE 10 OR LESS*                                [x]  10932 (Therapeutic Exercise)                   []  92507 (SLP Treatment)      [x]  97112 (Neuro Re-ed)                                []  92526 (Swallowing Treatment)                  [x]  97116 (Gait Training)                                []  [id number] (Cognitive Training, 1st 15 minutes) [x]  97140 (Manual Therapy)                           []  97130 (Cognitive Training, each add'l 15 minutes)         []  97164 (Re-evaluation)                              []  Other, List CPT Code ____________  [x]  97530 (Therapeutic Activities)                                            [x]  97535 (Self Care)               []  All codes above (97110 - 97535)             []  97012 (Mechanical Traction)             [x]  97014 (E-stim Unattended)             []  97032 (E-stim manual)             []  97033 (Ionto)             []  97035 (Ultrasound) [x]  97750 (Physical Performance Training) []  [id number] (Aquatic Therapy) []  97016 (Vasopneumatic Device) []  [id number] (Paraffin) []  97034 (Contrast Bath) []  97597 (Wound Care 1st 20 sq cm) []  97598 (Wound Care each add'l 20 sq cm) []  97760 (Orthotic  Fabrication, Fitting, Training Initial) []  [id number] (Prosthetic Management and Training Initial) []  [id number] (Orthotic or Prosthetic Training/ Modification Subsequent)

## 2023-07-09 ENCOUNTER — Ambulatory Visit: Payer: Medicare PPO | Admitting: Physical Therapy

## 2023-07-09 ENCOUNTER — Encounter: Payer: Self-pay | Admitting: Physical Therapy

## 2023-07-09 DIAGNOSIS — R262 Difficulty in walking, not elsewhere classified: Secondary | ICD-10-CM

## 2023-07-09 DIAGNOSIS — M5459 Other low back pain: Secondary | ICD-10-CM | POA: Diagnosis not present

## 2023-07-09 DIAGNOSIS — M6281 Muscle weakness (generalized): Secondary | ICD-10-CM

## 2023-07-09 NOTE — Therapy (Signed)
OUTPATIENT PHYSICAL THERAPY TREATMENT   Patient Name: Ernest Hughes. MRN: [id number] DOB:May 06, 1946, 77 y.o., male Today's Date: 07/09/2023  END OF SESSION:  PT End of Session - 07/09/23 1053     Visit Number 14    Number of Visits 20    Date for PT Re-Evaluation 07/27/23    Authorization Type 6 more visits until 12/30    Authorization Time Period 07/27/23    Authorization - Visit Number 2    Authorization - Number of Visits 6    Progress Note Due on Visit --   at discharge   PT Start Time 1046    PT Stop Time 1130    PT Time Calculation (min) 44 min    Activity Tolerance Patient tolerated treatment well    Behavior During Therapy WFL for tasks assessed/performed                    Past Medical History:  Diagnosis Date   Arthritis    Asthma    as a baby   BPH (benign prostatic hyperplasia)    Cancer (HCC)    PROSTATE--treated with radiation   Diverticulosis    GERD (gastroesophageal reflux disease)    Hyperlipidemia    Hypertension    Past Surgical History:  Procedure Laterality Date   BELPHAROPTOSIS REPAIR  12/2013   bilateral   cataract surgery Left 2019   COLONOSCOPY     Dr. Jarold Motto   Patient Active Problem List   Diagnosis Date Noted   Lipoma of back 09/13/2022   New onset type 2 diabetes mellitus (HCC) 09/13/2022   Benign prostatic hyperplasia with nocturia 08/29/2021   History of arthroscopic knee surgery 11/18/2020   Arthritis 05/10/2020   History of cataract surgery, unspecified laterality 04/28/2019   Erectile dysfunction 04/28/2019   History of prostate cancer 05/22/2011   GERD 02/02/2009   Hyperlipidemia associated with type 2 diabetes mellitus (HCC) 02/01/2009   Hypertension associated with diabetes (HCC) 02/01/2009   Diverticulosis of colon 02/01/2009    PCP: Ronnald Nian, MD   REFERRING PROVIDER: Ronnald Nian, MD   REFERRING DIAG:  Diagnosis  M54.9 (ICD-10-CM) - Musculoskeletal back pain    Rationale for  Evaluation and Treatment: Rehabilitation  THERAPY DIAG:  Other low back pain  Muscle weakness (generalized)  Difficulty in walking, not elsewhere classified  ONSET DATE: "a while"  SUBJECTIVE:  SUBJECTIVE STATEMENT: Pt arriving stating the pain is not too bad, he did not take any meds before coming in to see how he does without them  PERTINENT HISTORY:  DM, cancer (prostate treated with radiation), GERD, BPH, Diverticulosis, arthritis, asthma, HTN, hyperlipidemia, left meniscus surgery  PAIN:  NPRS scale: 5/10 Pain location: Rt sided low back and side  Pain description: ache but its gotten better  Aggravating factors: bending, prolonged standing, sitting Relieving factors: muscle relaxer, over the counter meds, walking  PRECAUTIONS: None  WEIGHT BEARING RESTRICTIONS: No  FALLS:  Has patient fallen in last 6 months? No  LIVING ENVIRONMENT: Lives with: lives with their family and lives with their spouse Lives in: House/apartment Stairs: 5 steps outside rail on both,  1 flight inside: rail on left Has following equipment at home: None  OCCUPATION: retired  PLOF: Independent  PATIENT GOALS: walk more get back to gym and playing golf  Next MD Visit: follow up as needed   OBJECTIVE:   DIAGNOSTIC FINDINGS: 12/14/2016 Scoliosis lumbar spine. Diffuse degenerative changes multilevel disc degeneration again noted. No change from prior exam. No acute bony abnormality identified. No evidence of fracture. Pelvic calcifications consistent with phleboliths.   IMPRESSION: Scoliosis lumbar spine. Diffuse degenerative  change with multilevel disc degeneration again noted. No change from prior exam. No acute abnormality identified.  PATIENT SURVEYS:  05/09/23: FOTO eval:  60% 06/27/23 FOTO decreased to 59% but he admits he answered the question about doing vigorous activities wrong the first day at intake, he rated at little difficulty and should have been a lot of difficulty.    SCREENING FOR RED FLAGS: Bowel or bladder incontinence: No Cauda equina syndrome: No  COGNITION: Overall cognitive status: WFL normal      SENSATION: WFL  MUSCLE LENGTH: Eval: Hamstrings: Right 58 deg; Left 45 deg     PALPATION: 05/09/23 TTP: Rt QL and bilateral lumbar paraspinals  LUMBAR ROM:   AROM 05/09/23 05/21/2023 06/04/23 06/27/23 07/02/23  Flexion 55 finger tips to above knees To ankles, no complaints noted.  Finger tips to top of ankles, no pain noted  WNL WFL Finger tips 4 inches above ankles  Extension 15 75% WFL  75% WFL WNL 35  Right lateral flexion 22 To knee joint no complaints.    35  Left lateral flexion 20 To lateral epicondyle of femur.    32  Right rotation Limited 50%  WFL WNL WFL  Left rotation Limited 25%  WFL WNL WFL   (Blank rows = not tested)  LOWER EXTREMITY ROM:      Right 05/09/23 Supine active Left 05/09/23 Supine active Rt / Left 05/28/23 Supine  active Rt / Left 05/28/23 Supine  active  Hip flexion 100 98 110 / 106 110/ 108  Hip extension      Hip abduction      Hip adduction      Hip internal rotation      Hip external rotation      Knee flexion 120 125    Knee extension      Ankle dorsiflexion      Ankle plantarflexion      Ankle inversion      Ankle eversion       (Blank rows = not tested)  LOWER EXTREMITY MMT:    MMT Right 05/09/23 Left 05/09/23 Rt/Lt 06/27/23 Rt / Left 07/02/23  Hip flexion 4 4 5/5 5 / 5  Hip extension      Hip abduction  4 / 4  Hip adduction 5 5  5  / 5  Hip internal rotation      Hip external rotation      Knee flexion 5 5  5   / 5  Knee extension 5 5  5  / 5  Ankle dorsiflexion      Ankle plantarflexion      Ankle inversion      Ankle eversion       (Blank rows = not tested)  LUMBAR SPECIAL TESTS:  05/09/23 Slump test: Negative bilaterally  FUNCTIONAL TESTS:  05/09/23 5 times sit to stand: 17.8 seconds UE support 05/28/23:  5 times sit to stand: 10.56 seconds no UE support 06/06/23:  5 times sit to stand: 9.8 seconds No UE support   GAIT: 05/09/23 Distance walked: clinic distance Assistive device utilized: None Level of assistance: Complete Independence Comments: forward flexed at hips and head                                                                                                                                                                                                                 TODAY'S TREATMENT: DATE:  07/09/23 TherEx Recumbent bike L3 X10 min Hamstirng stretches: x 3 bil LE holding 30 sec Tandem walk on  Airex beam x 5 with intermittent UE support PRN Lateral walk on  Airex beam x 5 with intermittent UE support PRN Dead lifts from 4 inch inch step to waist 2X10 with 20#, cues and demo for form and body mechanics Step ups on 6 inch step without UE support x 15 bil LE leading Leg press: 100# bil LE 2 x 15  Leg Press: single LE 50#  2x 15 bil  Seated chest press machine 20# 2X15 Seated row machine 35# 2X15 Seated lat pulldown machine 35# 2X10 Supine low trunk rotations 10 sec X 5 bilat Supine SKTC stretch 20 sec  X2 bilat Bridge c clam shell with green band x 10 holding 5 sec QL door stretch: x 3 holding 20 sec  07/05/23 TherEx Recumbent bike L3 X10 min Hamstirng stretches: x 3 bil LE holding 30 sec Tandem walk on  Airex beam x 5 with intermittent UE support PRN Lateral walk on  Airex beam x 5 with intermittent UE support PRN Leg press: 100# bil LE 2 x 15  Leg Press: single LE 50# x 15 bil  Seated row machine 35# 2X15 Seated lat pulldown machine 35# 2X10 Dead lifts  from 6 inch inch step to waist 2X10 with 20#,  cues and demo for form and body mechanics Step ups on 6 inch step without UE support x 10 bil LE leading Supine low trunk rotations 10 sec X 5 bilat Supine SKTC stretch 20 sec  X2 bilat Bridge c clam shell with green band x 10 holding 5 sec QL door stretch: x 3 holding 20 sec    PATIENT EDUCATION:   Education details: HEP, POC, DN  Person educated: Patient Education method: Explanation, Demonstration, Verbal cues, and Handouts for HEP and DN Education comprehension: verbalized understanding, returned demonstration, and verbal cues required  HOME EXERCISE PROGRAM: Access Code: [email or web address] URL: [email or web address] Date: 05/09/2023 Prepared by: Chyrel Masson  Exercises - Supine Bridge  - 1-2 x daily - 7 x weekly - 2 sets - 10 reps - 5 seconds hold - Supine Lower Trunk Rotation  - 1-2 x daily - 7 x weekly - 3-5 reps - 10 seconds hold - Hooklying Single Knee to Chest Stretch  - 1-2 x daily - 7 x weekly - 3-5 sets - 10 seconds hold - Seated Hamstring Stretch  - 1-2 x daily - 7 x weekly - 3 reps - 20 seconds hold - Standing Lumbar Extension at Wall - Forearms  - 1-2 x daily - 7 x weekly - 5-10 reps - 10 seconds hold - Sidelying Lumbar Rotation Stretch  - 1 x daily - 7 x weekly - 5 reps - 1 sets - 15 hold  ASSESSMENT:  CLINICAL IMPRESSION: He showed good tolerance to session without taking any tyelenol before hand. He has one more visit scheduled and we will assess his readiness to transition to HEP.   OBJECTIVE IMPAIRMENTS: decreased mobility, difficulty walking, decreased ROM, decreased strength, and pain.   ACTIVITY LIMITATIONS: lifting, bending, standing, and stairs  PARTICIPATION LIMITATIONS: driving and community activity, sleeping  PERSONAL FACTORS: 3+ comorbidities: see pertinent history above  are also affecting patient's functional outcome.   REHAB POTENTIAL: Good  CLINICAL DECISION MAKING:  Stable/uncomplicated  EVALUATION COMPLEXITY: Low   GOALS: Goals reviewed with patient? Yes  SHORT TERM GOALS: (target date for Short term goals are 3 weeks 06/01/23)  1. Patient will demonstrate independent use of home exercise program to maintain progress from in clinic treatments.  Goal status:  MET 05/30/23  LONG TERM GOALS: (target date 07/27/23 )   1. Patient will demonstrate/report pain at worst less than or equal to 2/10 to facilitate minimal limitation in daily activity secondary to pain symptoms.  Goal status: on-going 06/27/23   2. Patient will demonstrate independent use of home exercise program to facilitate ability to maintain/progress functional gains from skilled physical therapy services.  Goal status: ongoing on-going 06/27/23   3. Patient will demonstrate FOTO outcome > or = 68 % to indicate reduced disability due to condition.  Goal status: on-going 06/27/23   4. Patient will demonstrate lumbar extension 100 % WFL s symptoms to facilitate upright standing, walking posture at PLOF s limitation.  Goal status: MET 06/27/23   5.  Pt will be able to lift 20# from floor to counter height using correct body mechanics with no pain.   Goal status: on-going 06/27/23, is doing this from 8 inches from floor   6.  Pt will improve his hamstring flexibility to >/= 60 deg bilaterally to help improve overall functional mobility.  Goal status: MET 06/27/23, now has 72 deg   7. Pt will improve his bil trunk rotation to [facility] for return to golf.  Goal status:MET 06/27/23   PLAN:  PT FREQUENCY: 1-2x/week  PT DURATION: 3 weeks (07/27/23)  PLANNED INTERVENTIONS: Therapeutic exercises, Therapeutic activity, Neuro Muscular re-education, Balance training, Gait training, Patient/Family education, Joint mobilization, Stair training, DME instructions, Dry Needling, Electrical stimulation, Cryotherapy, vasopneumatic device, Moist heat, Taping, Traction Ultrasound, Ionotophoresis  4mg /ml Dexamethasone, and aquatic therapy, Manual therapy.  All included unless contraindicated  PLAN FOR NEXT SESSION: is he ready to transition to independent program?  Ivery Quale, PT, DPT 07/09/23 10:53 AM    Referring diagnosis? M54.9 Treatment diagnosis? (if different than referring diagnosis) M54.9, M62.81, R26.2 What was this (referring dx) caused by? []  Surgery []  Fall [x]  Ongoing issue [x]  Arthritis []  Other: ____________   Laterality: []  Rt []  Lt []  Both   Check all possible CPT codes:                   *CHOOSE 10 OR LESS*                                [x]  97110 (Therapeutic Exercise)                   []  92507 (SLP Treatment)      [x]  97112 (Neuro Re-ed)                                []  92526 (Swallowing Treatment)                  [x]  97116 (Gait Training)                                []  [id number] (Cognitive Training, 1st 15 minutes) [x]  97140 (Manual Therapy)                           []  97130 (Cognitive Training, each add'l 15 minutes)         []  97164 (Re-evaluation)                              []  Other, List CPT Code ____________  [x]  97530 (Therapeutic Activities)                                            [x]  97535 (Self Care)               []  All codes above (97110 - 97535)             []  97012 (Mechanical Traction)             [x]  97014 (E-stim Unattended)             []  97032 (E-stim manual)             []  97033 (Ionto)             []  97035 (Ultrasound) [x]  97750 (Physical Performance Training) []  [id number] (Aquatic Therapy) []  97016 (Vasopneumatic Device) []  [id number] (Paraffin) []  97034 (Contrast Bath) []  97597 (Wound Care 1st 20 sq cm) []  97598 (Wound Care each add'l 20 sq cm) []  97760 (Orthotic Fabrication, Fitting, Training Initial) []  [id number] (Prosthetic Management and Training Initial) []  [id number] (Orthotic  or Prosthetic Training/ Modification Subsequent)

## 2023-07-11 ENCOUNTER — Ambulatory Visit: Payer: Medicare PPO | Admitting: Physical Therapy

## 2023-07-11 ENCOUNTER — Encounter: Payer: Self-pay | Admitting: Physical Therapy

## 2023-07-11 DIAGNOSIS — M6281 Muscle weakness (generalized): Secondary | ICD-10-CM | POA: Diagnosis not present

## 2023-07-11 DIAGNOSIS — M5459 Other low back pain: Secondary | ICD-10-CM

## 2023-07-11 DIAGNOSIS — R262 Difficulty in walking, not elsewhere classified: Secondary | ICD-10-CM

## 2023-07-11 NOTE — Therapy (Addendum)
OUTPATIENT PHYSICAL THERAPY TREATMENT  / DISCHARGE   Patient Name: Ernest Hughes. MRN: [id number] DOB:11/16/1945, 77 y.o., male Today's Date: 07/11/2023  END OF SESSION:  PT End of Session - 07/11/23 1133     Visit Number 15    Number of Visits 20    Date for PT Re-Evaluation 07/27/23    Authorization Type 6 more visits until 12/30    Authorization Time Period 07/27/23    Authorization - Visit Number 3    Authorization - Number of Visits 6    Progress Note Due on Visit --   at discharge   PT Start Time 1055    PT Stop Time 1140    PT Time Calculation (min) 45 min    Activity Tolerance Patient tolerated treatment well    Behavior During Therapy WFL for tasks assessed/performed                     Past Medical History:  Diagnosis Date   Arthritis    Asthma    as a baby   BPH (benign prostatic hyperplasia)    Cancer (HCC)    PROSTATE--treated with radiation   Diverticulosis    GERD (gastroesophageal reflux disease)    Hyperlipidemia    Hypertension    Past Surgical History:  Procedure Laterality Date   BELPHAROPTOSIS REPAIR  12/2013   bilateral   cataract surgery Left 2019   COLONOSCOPY     Dr. Jarold Motto   Patient Active Problem List   Diagnosis Date Noted   Lipoma of back 09/13/2022   New onset type 2 diabetes mellitus (HCC) 09/13/2022   Benign prostatic hyperplasia with nocturia 08/29/2021   History of arthroscopic knee surgery 11/18/2020   Arthritis 05/10/2020   History of cataract surgery, unspecified laterality 04/28/2019   Erectile dysfunction 04/28/2019   History of prostate cancer 05/22/2011   GERD 02/02/2009   Hyperlipidemia associated with type 2 diabetes mellitus (HCC) 02/01/2009   Hypertension associated with diabetes (HCC) 02/01/2009   Diverticulosis of colon 02/01/2009    PCP: Ronnald Nian, MD   REFERRING PROVIDER: Ronnald Nian, MD   REFERRING DIAG:  Diagnosis  M54.9 (ICD-10-CM) - Musculoskeletal back pain     Rationale for Evaluation and Treatment: Rehabilitation  THERAPY DIAG:  Other low back pain  Muscle weakness (generalized)  Difficulty in walking, not elsewhere classified  ONSET DATE: "a while"  SUBJECTIVE:  SUBJECTIVE STATEMENT: He says did okay without taking the tylenol before coming in. He wants to hold PT up to 30 days to trial independent program   PERTINENT HISTORY:  DM, cancer (prostate treated with radiation), GERD, BPH, Diverticulosis, arthritis, asthma, HTN, hyperlipidemia, left meniscus surgery  PAIN:  NPRS scale: 5/10 Pain location: Rt sided low back and side  Pain description: ache but its gotten better  Aggravating factors: bending, prolonged standing, sitting Relieving factors: muscle relaxer, over the counter meds, walking  PRECAUTIONS: None  WEIGHT BEARING RESTRICTIONS: No  FALLS:  Has patient fallen in last 6 months? No  LIVING ENVIRONMENT: Lives with: lives with their family and lives with their spouse Lives in: House/apartment Stairs: 5 steps outside rail on both,  1 flight inside: rail on left Has following equipment at home: None  OCCUPATION: retired  PLOF: Independent  PATIENT GOALS: walk more get back to gym and playing golf  Next MD Visit: follow up as needed   OBJECTIVE:   DIAGNOSTIC FINDINGS: 12/14/2016 Scoliosis lumbar spine. Diffuse degenerative changes multilevel disc degeneration again noted. No change from prior exam. No acute bony abnormality identified. No evidence of fracture. Pelvic calcifications consistent with phleboliths.   IMPRESSION: Scoliosis lumbar  spine. Diffuse degenerative change with multilevel disc degeneration again noted. No change from prior exam. No acute abnormality identified.  PATIENT SURVEYS:  05/09/23: FOTO eval:  60% 06/27/23 FOTO decreased to 59% but he admits he answered the question about doing vigorous activities wrong the first day at intake, he rated at little difficulty and should have been a lot of difficulty.    SCREENING FOR RED FLAGS: Bowel or bladder incontinence: No Cauda equina syndrome: No  COGNITION: Overall cognitive status: WFL normal      SENSATION: WFL  MUSCLE LENGTH: Eval: Hamstrings: Right 58 deg; Left 45 deg     PALPATION: 05/09/23 TTP: Rt QL and bilateral lumbar paraspinals  LUMBAR ROM:   AROM 05/09/23 05/21/2023 06/04/23 06/27/23 07/02/23  Flexion 55 finger tips to above knees To ankles, no complaints noted.  Finger tips to top of ankles, no pain noted  WNL WFL Finger tips 4 inches above ankles  Extension 15 75% WFL  75% WFL WNL 35  Right lateral flexion 22 To knee joint no complaints.    35  Left lateral flexion 20 To lateral epicondyle of femur.    32  Right rotation Limited 50%  WFL WNL WFL  Left rotation Limited 25%  WFL WNL WFL   (Blank rows = not tested)  LOWER EXTREMITY ROM:      Right 05/09/23 Supine active Left 05/09/23 Supine active Rt / Left 05/28/23 Supine  active Rt / Left 05/28/23 Supine  active  Hip flexion 100 98 110 / 106 110/ 108  Hip extension      Hip abduction      Hip adduction      Hip internal rotation      Hip external rotation      Knee flexion 120 125    Knee extension      Ankle dorsiflexion      Ankle plantarflexion      Ankle inversion      Ankle eversion       (Blank rows = not tested)  LOWER EXTREMITY MMT:    MMT Right 05/09/23 Left 05/09/23 Rt/Lt 06/27/23 Rt / Left 07/02/23  Hip flexion 4 4 5/5 5 / 5  Hip extension      Hip abduction  4 / 4  Hip adduction 5 5  5  / 5  Hip internal rotation      Hip external  rotation      Knee flexion 5 5  5  / 5  Knee extension 5 5  5  / 5  Ankle dorsiflexion      Ankle plantarflexion      Ankle inversion      Ankle eversion       (Blank rows = not tested)  LUMBAR SPECIAL TESTS:  05/09/23 Slump test: Negative bilaterally  FUNCTIONAL TESTS:  05/09/23 5 times sit to stand: 17.8 seconds UE support 05/28/23:  5 times sit to stand: 10.56 seconds no UE support 06/06/23:  5 times sit to stand: 9.8 seconds No UE support   GAIT: 05/09/23 Distance walked: clinic distance Assistive device utilized: None Level of assistance: Complete Independence Comments: forward flexed at hips and head                                                                                                                                                                                                                 TODAY'S TREATMENT: DATE:  07/11/23 TherEx Recumbent bike L3 X10 min Hamstirng stretches: x 3 bil LE holding 30 sec Tandem walk on  Airex beam x 5 with intermittent UE support PRN Lateral walk on  Airex beam x 5 with intermittent UE support PRN Dead lifts from 4 inch inch step to waist 2X10 with 20#, cues and demo for form and body mechanics Step ups on 6 inch step without UE support x 15 bil LE leading Leg press: 100# bil LE 2 x 15  Leg Press: single LE 50#  2x 15 bil  Seated chest press machine 20# 2X15 Seated row machine 35# 2X15 Seated lat pulldown machine 35# 2X10 Supine low trunk rotations 10 sec X 5 bilat Supine SKTC stretch 20 sec  X3 bilat Bridge c clam shell with green band x 1  07/09/23 TherEx Recumbent bike L3 X10 min Hamstirng stretches: x 3 bil LE holding 30 sec Tandem walk on  Airex beam x 5 with intermittent UE support PRN Lateral walk on  Airex beam x 5 with intermittent UE support PRN Dead lifts from 4 inch inch step to waist 2X10 with 20#, cues and demo for form and body mechanics Step ups on 6 inch step without UE support x 15 bil LE  leading Leg press: 100# bil LE 2 x 15  Leg Press: single LE 50#  2x 15 bil  Seated chest press machine 20# 2X15 Seated row machine 35# 2X15 Seated lat pulldown machine 35# 2X10 Supine low trunk rotations 10 sec X 5 bilat Supine SKTC stretch 20 sec  X2 bilat Bridge c clam shell with green band x 10 holding 5 sec QL door stretch: x 3 holding 20 sec  07/05/23 TherEx Recumbent bike L3 X10 min Hamstirng stretches: x 3 bil LE holding 30 sec Tandem walk on  Airex beam x 5 with intermittent UE support PRN Lateral walk on  Airex beam x 5 with intermittent UE support PRN Leg press: 100# bil LE 2 x 15  Leg Press: single LE 50# x 15 bil  Seated row machine 35# 2X15 Seated lat pulldown machine 35# 2X10 Dead lifts from 6 inch inch step to waist 2X10 with 20#, cues and demo for form and body mechanics Step ups on 6 inch step without UE support x 10 bil LE leading Supine low trunk rotations 10 sec X 5 bilat Supine SKTC stretch 20 sec  X2 bilat Bridge c clam shell with green band x 10 holding 5 sec QL door stretch: x 3 holding 20 sec    PATIENT EDUCATION:   Education details: HEP, POC, DN  Person educated: Patient Education method: Programmer, multimedia, Demonstration, Verbal cues, and Handouts for HEP and DN Education comprehension: verbalized understanding, returned demonstration, and verbal cues required  HOME EXERCISE PROGRAM: Access Code: [email or web address] URL: [email or web address] Date: 05/09/2023 Prepared by: Chyrel Masson  Exercises - Supine Bridge  - 1-2 x daily - 7 x weekly - 2 sets - 10 reps - 5 seconds hold - Supine Lower Trunk Rotation  - 1-2 x daily - 7 x weekly - 3-5 reps - 10 seconds hold - Hooklying Single Knee to Chest Stretch  - 1-2 x daily - 7 x weekly - 3-5 sets - 10 seconds hold - Seated Hamstring Stretch  - 1-2 x daily - 7 x weekly - 3 reps - 20 seconds hold - Standing Lumbar Extension at Wall - Forearms  - 1-2 x daily - 7 x weekly - 5-10 reps - 10 seconds hold -  Sidelying Lumbar Rotation Stretch  - 1 x daily - 7 x weekly - 5 reps - 1 sets - 15 hold  ASSESSMENT:  CLINICAL IMPRESSION: He has made good progress overall. At this point he will will try independent program for up to 30 days and can return at any point if needed within the 30 days.   OBJECTIVE IMPAIRMENTS: decreased mobility, difficulty walking, decreased ROM, decreased strength, and pain.   ACTIVITY LIMITATIONS: lifting, bending, standing, and stairs  PARTICIPATION LIMITATIONS: driving and community activity, sleeping  PERSONAL FACTORS: 3+ comorbidities: see pertinent history above  are also affecting patient's functional outcome.   REHAB POTENTIAL: Good  CLINICAL DECISION MAKING: Stable/uncomplicated  EVALUATION COMPLEXITY: Low   GOALS: Goals reviewed with patient? Yes  SHORT TERM GOALS: (target date for Short term goals are 3 weeks 06/01/23)  1. Patient will demonstrate independent use of home exercise program to maintain progress from in clinic treatments.  Goal status:  MET 05/30/23  LONG TERM GOALS: (target date 07/27/23 )   1. Patient will demonstrate/report pain at worst less than or equal to 2/10 to facilitate minimal limitation in daily activity secondary to pain symptoms.  Goal status: on-going 06/27/23   2. Patient will demonstrate independent use of home exercise program to facilitate ability to maintain/progress functional gains from skilled physical therapy services.  Goal status: ongoing on-going 06/27/23  3. Patient will demonstrate FOTO outcome > or = 68 % to indicate reduced disability due to condition.  Goal status: on-going 06/27/23   4. Patient will demonstrate lumbar extension 100 % WFL s symptoms to facilitate upright standing, walking posture at PLOF s limitation.  Goal status: MET 06/27/23   5.  Pt will be able to lift 20# from floor to counter height using correct body mechanics with no pain.   Goal status: on-going 06/27/23, is doing  this from 8 inches from floor   6.  Pt will improve his hamstring flexibility to >/= 60 deg bilaterally to help improve overall functional mobility.  Goal status: MET 06/27/23, now has 72 deg   7. Pt will improve his bil trunk rotation to [facility] for return to golf.  Goal status:MET 06/27/23   PLAN:  PT FREQUENCY: 1-2x/week  PT DURATION: 3 weeks (07/27/23)  PLANNED INTERVENTIONS: Therapeutic exercises, Therapeutic activity, Neuro Muscular re-education, Balance training, Gait training, Patient/Family education, Joint mobilization, Stair training, DME instructions, Dry Needling, Electrical stimulation, Cryotherapy, vasopneumatic device, Moist heat, Taping, Traction Ultrasound, Ionotophoresis 4mg /ml Dexamethasone, and aquatic therapy, Manual therapy.  All included unless contraindicated  PLAN FOR NEXT SESSION:  transition to independent program and keep open 30 days. DC after that if no return.   Ivery Quale, PT, DPT 07/11/23 11:36 AM   PHYSICAL THERAPY DISCHARGE SUMMARY  Visits from Start of Care: 15  Current functional level related to goals / functional outcomes: See note   Remaining deficits: See note   Education / Equipment: HEP  Patient goals were met. Patient is being discharged due to not returning since the last visit.  Chyrel Masson, PT, DPT, OCS, ATC 08/09/23  1:23 PM

## 2023-07-25 DIAGNOSIS — Z961 Presence of intraocular lens: Secondary | ICD-10-CM | POA: Diagnosis not present

## 2023-07-25 DIAGNOSIS — H0102A Squamous blepharitis right eye, upper and lower eyelids: Secondary | ICD-10-CM | POA: Diagnosis not present

## 2023-07-25 DIAGNOSIS — H0102B Squamous blepharitis left eye, upper and lower eyelids: Secondary | ICD-10-CM | POA: Diagnosis not present

## 2023-07-25 DIAGNOSIS — H04522 Eversion of left lacrimal punctum: Secondary | ICD-10-CM | POA: Diagnosis not present

## 2023-07-25 DIAGNOSIS — H10413 Chronic giant papillary conjunctivitis, bilateral: Secondary | ICD-10-CM | POA: Diagnosis not present

## 2023-07-25 DIAGNOSIS — H47323 Drusen of optic disc, bilateral: Secondary | ICD-10-CM | POA: Diagnosis not present

## 2023-07-25 DIAGNOSIS — H43811 Vitreous degeneration, right eye: Secondary | ICD-10-CM | POA: Diagnosis not present

## 2023-09-11 ENCOUNTER — Ambulatory Visit: Payer: Medicare PPO

## 2023-09-11 DIAGNOSIS — Z0000 Encounter for general adult medical examination without abnormal findings: Secondary | ICD-10-CM | POA: Diagnosis not present

## 2023-09-11 NOTE — Patient Instructions (Signed)
[name] , Thank you for taking time to come for your Medicare Wellness Visit. I appreciate your ongoing commitment to your health goals. Please review the following plan we discussed and let me know if I can assist you in the future.   Referrals/Orders/Follow-Ups/Clinician Recommendations: would like PSA checked. Urologist has left the practice.  This is a list of the screening recommended for you and due dates:  Health Maintenance  Topic Date Due   Complete foot exam   Never done   Eye exam for diabetics  Never done   COVID-19 Vaccine (7 - 2024-25 season) 06/12/2023   Yearly kidney function blood test for diabetes  09/13/2023   Yearly kidney health urinalysis for diabetes  02/14/2024*   Hemoglobin A1C  10/29/2023   Medicare Annual Wellness Visit  09/10/2024   DTaP/Tdap/Td vaccine (3 - Td or Tdap) 05/13/2029   Pneumonia Vaccine  Completed   Flu Shot  Completed   Hepatitis C Screening  Completed   Zoster (Shingles) Vaccine  Completed   HPV Vaccine  Aged Out   Cologuard (Stool DNA test)  Discontinued  *Topic was postponed. The date shown is not the original due date.    Advanced directives: (In Chart) A copy of your advanced directives are scanned into your chart should your provider ever need it.  Next Medicare Annual Wellness Visit scheduled for next year: Yes  insert Preventive Care attachment Insert FALL PREVENTION attachment if needed

## 2023-09-11 NOTE — Progress Notes (Signed)
Subjective:   Ernest Hughes. is a 78 y.o. male who presents for Medicare Annual/Subsequent preventive examination.  Visit Complete: Virtual I connected with  Earnest Rosier. on 09/11/23 by a audio enabled telemedicine application and verified that I am speaking with the correct person using two identifiers.  Interactive audio and video telecommunications were attempted between this provider and patient, however failed, due to patient having technical difficulties OR patient did not have access to video capability.  We continued and completed visit with audio only.  Patient Location: Home  Provider Location: Office/Clinic  I discussed the limitations of evaluation and management by telemedicine. The patient expressed understanding and agreed to proceed.  Vital Signs: Because this visit was a virtual/telehealth visit, some criteria may be missing or patient reported. Any vitals not documented were not able to be obtained and vitals that have been documented are patient reported.  Patient Medicare AWV questionnaire was completed by the patient on 09/11/2023; I have confirmed that all information answered by patient is correct and no changes since this date.  Cardiac Risk Factors include: advanced age (>41men, >44 women);diabetes mellitus;dyslipidemia;hypertension     Objective:    Today's Vitals   There is no height or weight on file to calculate BMI.     09/11/2023   11:48 AM 05/09/2023   12:04 PM 09/05/2022   11:58 AM 08/29/2021    9:07 AM 05/10/2020   10:34 AM 12/05/2019    1:22 PM 04/28/2019    1:16 PM  Advanced Directives  Does Patient Have a Medical Advance Directive? Yes Yes Yes Yes Yes Yes No  Type of Estate agent of [place];Living will Healthcare Power of [place];Living will Healthcare Power of [place];Living will Living will;Healthcare Power of [employer] Power of [place];Living will Healthcare Power of [place];Living will   Does  patient want to make changes to medical advance directive?  No - Patient declined No - Patient declined No - Patient declined     Copy of Healthcare Power of Attorney in Chart? Yes - validated most recent copy scanned in chart (See row information) No - copy requested Yes - validated most recent copy scanned in chart (See row information) Yes - validated most recent copy scanned in chart (See row information) Yes - validated most recent copy scanned in chart (See row information) Yes - validated most recent copy scanned in chart (See row information)   Would patient like information on creating a medical advance directive?       No - Patient declined    Current Medications (verified) Outpatient Encounter Medications as of 09/11/2023  Medication Sig   acetaminophen (TYLENOL) 500 MG tablet Take 500 mg by mouth every 6 (six) hours as needed.   alum & mag hydroxide-simeth (MAALOX PLUS) 400-400-40 MG/5ML suspension Take 5 mLs by mouth every 6 (six) hours as needed for indigestion.   amLODipine-olmesartan (AZOR) 5-20 MG tablet Take 1 tablet by mouth daily.   atorvastatin (LIPITOR) 10 MG tablet Take 1 tablet (10 mg total) by mouth daily.   cetaphil (CETAPHIL) lotion Apply 1 Application topically daily.   diclofenac sodium (VOLTAREN) 1 % GEL Apply topically 4 (four) times daily.   erythromycin ophthalmic ointment Place 1 Application into the left eye as needed.   Ibuprofen (ADVIL PO) Take by mouth.   loratadine (CLARITIN) 10 MG tablet Take 10 mg by mouth daily.   Polyvinyl Alcohol-Povidone (REFRESH OP)    psyllium (METAMUCIL) 58.6 % powder Take 1 packet  by mouth daily.   tamsulosin (FLOMAX) 0.4 MG CAPS capsule Take 1 capsule (0.4 mg total) by mouth 2 (two) times daily.   doxepin (SINEQUAN) 10 MG capsule Take 10-30 mg by mouth as needed. (Patient not taking: Reported on 09/11/2023)   Ginkgo Biloba 40 MG TABS Take 120 mg by mouth daily. (Patient not taking: Reported on 09/11/2023)   levocetirizine (XYZAL) 5  MG tablet Take 5 mg by mouth daily. (Patient not taking: Reported on 09/11/2023)   tadalafil (CIALIS) 5 MG tablet Take 1 tablet (5 mg total) by mouth daily. (Patient not taking: Reported on 09/11/2023)   No facility-administered encounter medications on file as of 09/11/2023.    Allergies (verified) Patient has no known allergies.   History: Past Medical History:  Diagnosis Date   Arthritis    Asthma    as a baby   BPH (benign prostatic hyperplasia)    Cancer (HCC)    PROSTATE--treated with radiation   Diverticulosis    GERD (gastroesophageal reflux disease)    Hyperlipidemia    Hypertension    Past Surgical History:  Procedure Laterality Date   BELPHAROPTOSIS REPAIR  12/2013   bilateral   cataract surgery Left 2019   COLONOSCOPY     Dr. Jarold Motto   Family History  Problem Relation Age of Onset   Cancer Mother    Cancer Father    Prostate cancer Father    Heart disease Sister    Cancer Maternal Aunt    Cancer Paternal Aunt    Cancer Paternal Grandfather    Prostate cancer Brother    Colon polyps Neg Hx    Colon cancer Neg Hx    Rectal cancer Neg Hx    Stomach cancer Neg Hx    Social History   Socioeconomic History   Marital status: Married    Spouse name: Alona Bene   Number of children: 2   Years of education: Not on file   Highest education level: Not on file  Occupational History   Not on file  Tobacco Use   Smoking status: Never   Smokeless tobacco: Never  Vaping Use   Vaping status: Never Used  Substance and Sexual Activity   Alcohol use: Yes    Comment: has some wine once in a while    Drug use: No   Sexual activity: Not Currently  Other Topics Concern   Not on file  Social History Narrative   One son lives with him at times, other son lives in [place]   Social Drivers of Health   Financial Resource Strain: Low Risk  (09/11/2023)   Overall Financial Resource Strain (CARDIA)    Difficulty of Paying Living Expenses: Not hard at all  Food Insecurity: No  Food Insecurity (09/11/2023)   Hunger Vital Sign    Worried About Running Out of Food in the Last Year: Never true    Ran Out of Food in the Last Year: Never true  Transportation Needs: No Transportation Needs (09/11/2023)   PRAPARE - Administrator, Civil Service (Medical): No    Lack of Transportation (Non-Medical): No  Physical Activity: Sufficiently Active (09/11/2023)   Exercise Vital Sign    Days of Exercise per Week: 5 days    Minutes of Exercise per Session: 60 min  Stress: Stress Concern Present (09/11/2023)   [employer] of Occupational Health - Occupational Stress Questionnaire    Feeling of Stress : To some extent  Social Connections: Socially Integrated (09/11/2023)  Social Advertising account executive [NHANES]    Frequency of Communication with Friends and Family: Twice a week    Frequency of Social Gatherings with Friends and Family: Once a week    Attends Religious Services: More than 4 times per year    Active Member of [employer] or Organizations: Yes    Attends Engineer, structural: More than 4 times per year    Marital Status: Married    Tobacco Counseling Counseling given: Not Answered   Clinical Intake:  Pre-visit preparation completed: Yes  Pain : No/denies pain     Nutritional Risks: None Diabetes: Yes CBG done?: No Did pt. bring in CBG monitor from home?: No  How often do you need to have someone help you when you read instructions, pamphlets, or other written materials from your doctor or pharmacy?: 1 - Never  Interpreter Needed?: No  Information entered by :: NAllen LPN   Activities of Daily Living    09/11/2023   10:46 AM  In your present state of health, do you have any difficulty performing the following activities:  Hearing? 0  Vision? 1  Comment due to dry eyes  Difficulty concentrating or making decisions? 0  Walking or climbing stairs? 1  Comment had knee surgery  Dressing or bathing? 0  Doing errands,  shopping? 0  Preparing Food and eating ? N  Using the Toilet? N  In the past six months, have you accidently leaked urine? N  Do you have problems with loss of bowel control? N  Managing your Medications? N  Managing your Finances? N  Housekeeping or managing your Housekeeping? N    Patient Care Team: Ronnald Nian, MD as PCP - General (Family Medicine) Myrtie Neither, Andreas Blower, MD as Consulting Physician (Gastroenterology)  Indicate any recent Medical Services you may have received from other than [facility] providers in the past year (date may be approximate).     Assessment:   This is a routine wellness examination for [place].  Hearing/Vision screen Hearing Screening - Comments:: Denies hearing issues Vision Screening - Comments:: Regular eye exams, [facility]   Goals Addressed             This Visit's Progress    Patient Stated       09/11/2023, start eating healthier       Depression Screen    09/11/2023   11:51 AM 09/05/2022   11:55 AM 08/29/2021    9:05 AM 05/16/2021    9:45 AM 05/10/2020   10:35 AM 04/28/2019   11:11 AM 12/12/2017   10:40 AM  PHQ 2/9 Scores  PHQ - 2 Score 0 0 0 0 1 0 0  PHQ- 9 Score 2          Fall Risk    09/11/2023   10:46 AM 05/01/2023    9:17 AM 09/05/2022   11:44 AM 09/04/2022   11:38 PM 08/29/2021    9:07 AM  Fall Risk   Falls in the past year? 1 0 0 0 0  Comment passed out      Number falls in past yr: 0 0 0 0 0  Injury with Fall? 0 0 0 0 0  Risk for fall due to : Medication side effect No Fall Risks No Fall Risks  No Fall Risks  Follow up Falls prevention discussed;Falls evaluation completed Falls evaluation completed Falls prevention discussed  Falls evaluation completed    MEDICARE RISK AT HOME: Medicare Risk at Home  Any stairs in or around the home?: (Patient-Rptd) Yes If so, are there any without handrails?: (Patient-Rptd) No Home free of loose throw rugs in walkways, pet beds, electrical cords, etc?: (Patient-Rptd) Yes Adequate  lighting in your home to reduce risk of falls?: (Patient-Rptd) Yes Life alert?: (Patient-Rptd) No Use of a cane, walker or w/c?: (Patient-Rptd) No Grab bars in the bathroom?: (Patient-Rptd) Yes Shower chair or bench in shower?: (Patient-Rptd) No Elevated toilet seat or a handicapped toilet?: (Patient-Rptd) No  TIMED UP AND GO:  Was the test performed?  No    Cognitive Function:    04/28/2019   11:23 AM  MMSE - Mini Mental State Exam  Orientation to time 5  Orientation to Place 5  Registration 3  Attention/ Calculation 5  Recall 2  Language- name 2 objects 2  Language- repeat 1  Language- follow 3 step command 3  Language- read & follow direction 1  Write a sentence 1  Copy design 1  Total score 29        09/11/2023   11:57 AM 09/05/2022   12:02 PM  6CIT Screen  What Year? 0 points 0 points  What month? 0 points 0 points  What time? 0 points 0 points  Count back from 20 0 points 0 points  Months in reverse 2 points 0 points  Repeat phrase 0 points 2 points  Total Score 2 points 2 points    Immunizations Immunization History  Administered Date(s) Administered   Fluad Quad(high Dose 65+) 04/15/2019, 05/25/2020   Fluad Trivalent(High Dose 65+) 05/01/2023   Influenza Split 05/22/2011, 05/02/2012   Influenza Whole 08/05/2003, 06/11/2006   Influenza, High Dose Seasonal PF 05/19/2013, 05/05/2014, 04/20/2015, 06/16/2016, 04/24/2017   Influenza-Unspecified 06/14/2018, 04/28/2021, 02/23/2022   Moderna SARS-COV2 Booster Vaccination 05/04/2022   PFIZER Comirnaty(Gray Top)Covid-19 Tri-Sucrose Vaccine 12/09/2020   PFIZER(Purple Top)SARS-COV-2 Vaccination 09/03/2019, 09/24/2019, 08/10/2020   PNEUMOCOCCAL CONJUGATE-20 06/12/2023   PPD Test 11/01/2009   Pfizer Covid-19 Vaccine Bivalent Booster 49yrs & up 05/05/2021   Pfizer(Comirnaty)Fall Seasonal Vaccine 12 years and older 04/17/2023   Pneumococcal Conjugate-13 01/29/2008   Pneumococcal Polysaccharide-23 05/05/2014   Rsv,  Bivalent, Protein Subunit Rsvpref,pf (Abrysvo) 05/31/2022   Td 06/14/2005   Tdap 05/14/2019   Zoster Recombinant(Shingrix) 06/06/2017, 11/07/2017   Zoster, Live 06/11/2006    TDAP status: Up to date  Flu Vaccine status: Up to date  Pneumococcal vaccine status: Up to date  Covid-19 vaccine status: Completed vaccines  Qualifies for Shingles Vaccine? Yes   Zostavax completed Yes   Shingrix Completed?: Yes  Screening Tests Health Maintenance  Topic Date Due   FOOT EXAM  Never done   OPHTHALMOLOGY EXAM  Never done   COVID-19 Vaccine (7 - 2024-25 season) 06/12/2023   Diabetic kidney evaluation - eGFR measurement  09/13/2023   Diabetic kidney evaluation - Urine ACR  02/14/2024 (Originally 01/06/1964)   HEMOGLOBIN A1C  10/29/2023   Medicare Annual Wellness (AWV)  09/10/2024   DTaP/Tdap/Td (3 - Td or Tdap) 05/13/2029   Pneumonia Vaccine 70+ Years old  Completed   INFLUENZA VACCINE  Completed   Hepatitis C Screening  Completed   Zoster Vaccines- Shingrix  Completed   HPV VACCINES  Aged Out   Fecal DNA (Cologuard)  Discontinued    Health Maintenance  Health Maintenance Due  Topic Date Due   FOOT EXAM  Never done   OPHTHALMOLOGY EXAM  Never done   COVID-19 Vaccine (7 - 2024-25 season) 06/12/2023   Diabetic kidney evaluation - eGFR measurement  09/13/2023    Colorectal cancer screening: No longer required.   Lung Cancer Screening: (Low Dose CT Chest recommended if Age 29-80 years, 20 pack-year currently smoking OR have quit w/in 15years.) does not qualify.   Lung Cancer Screening Referral: no  Additional Screening:  Hepatitis C Screening: does qualify; Completed 06/16/2016  Vision Screening: Recommended annual ophthalmology exams for early detection of glaucoma and other disorders of the eye. Is the patient up to date with their annual eye exam?  No  Who is the provider or what is the name of the office in which the patient attends annual eye exams? [facility] If pt  is not established with a provider, would they like to be referred to a provider to establish care? No .   Dental Screening: Recommended annual dental exams for proper oral hygiene  Diabetic Foot Exam: Diabetic Foot Exam: Overdue, Pt has been advised about the importance in completing this exam. Pt is scheduled for diabetic foot exam on next appointment.  Community Resource Referral / Chronic Care Management: CRR required this visit?  No   CCM required this visit?  No     Plan:     I have personally reviewed and noted the following in the patient's chart:   Medical and social history Use of alcohol, tobacco or illicit drugs  Current medications and supplements including opioid prescriptions. Patient is not currently taking opioid prescriptions. Functional ability and status Nutritional status Physical activity Advanced directives List of other physicians Hospitalizations, surgeries, and ER visits in previous 12 months Vitals Screenings to include cognitive, depression, and falls Referrals and appointments  In addition, I have reviewed and discussed with patient certain preventive protocols, quality metrics, and best practice recommendations. A written personalized care plan for preventive services as well as general preventive health recommendations were provided to patient.     Barb Merino, LPN   [id number]   After Visit Summary: (MyChart) Due to this being a telephonic visit, the after visit summary with patients personalized plan was offered to patient via MyChart   Nurse Notes: Wants PSA checked. Has been getting tired between 2-4 pm. Needing to take a nap.

## 2023-09-12 ENCOUNTER — Encounter: Payer: Self-pay | Admitting: Family Medicine

## 2023-09-12 ENCOUNTER — Ambulatory Visit: Payer: Medicare PPO | Admitting: Family Medicine

## 2023-09-12 VITALS — BP 132/84 | HR 88 | Ht 69.0 in | Wt 204.2 lb

## 2023-09-12 DIAGNOSIS — E785 Hyperlipidemia, unspecified: Secondary | ICD-10-CM | POA: Diagnosis not present

## 2023-09-12 DIAGNOSIS — B351 Tinea unguium: Secondary | ICD-10-CM

## 2023-09-12 DIAGNOSIS — E1169 Type 2 diabetes mellitus with other specified complication: Secondary | ICD-10-CM | POA: Diagnosis not present

## 2023-09-12 DIAGNOSIS — E7800 Pure hypercholesterolemia, unspecified: Secondary | ICD-10-CM | POA: Insufficient documentation

## 2023-09-12 DIAGNOSIS — E1159 Type 2 diabetes mellitus with other circulatory complications: Secondary | ICD-10-CM | POA: Diagnosis not present

## 2023-09-12 DIAGNOSIS — I152 Hypertension secondary to endocrine disorders: Secondary | ICD-10-CM

## 2023-09-12 DIAGNOSIS — M1990 Unspecified osteoarthritis, unspecified site: Secondary | ICD-10-CM | POA: Diagnosis not present

## 2023-09-12 DIAGNOSIS — Z8546 Personal history of malignant neoplasm of prostate: Secondary | ICD-10-CM

## 2023-09-12 DIAGNOSIS — E119 Type 2 diabetes mellitus without complications: Secondary | ICD-10-CM | POA: Diagnosis not present

## 2023-09-12 DIAGNOSIS — N5201 Erectile dysfunction due to arterial insufficiency: Secondary | ICD-10-CM | POA: Diagnosis not present

## 2023-09-12 DIAGNOSIS — N401 Enlarged prostate with lower urinary tract symptoms: Secondary | ICD-10-CM

## 2023-09-12 DIAGNOSIS — K219 Gastro-esophageal reflux disease without esophagitis: Secondary | ICD-10-CM | POA: Diagnosis not present

## 2023-09-12 DIAGNOSIS — Z0000 Encounter for general adult medical examination without abnormal findings: Secondary | ICD-10-CM

## 2023-09-12 DIAGNOSIS — Z9849 Cataract extraction status, unspecified eye: Secondary | ICD-10-CM

## 2023-09-12 DIAGNOSIS — R351 Nocturia: Secondary | ICD-10-CM

## 2023-09-12 LAB — LIPID PANEL

## 2023-09-12 LAB — POCT UA - MICROALBUMIN
Albumin/Creatinine Ratio, Urine, POC: 22
Creatinine, POC: 51.3 mg/dL
Microalbumin Ur, POC: 11.3 mg/L

## 2023-09-12 LAB — POCT GLYCOSYLATED HEMOGLOBIN (HGB A1C): Hemoglobin A1C: 6.1 % — AB (ref 4.0–5.6)

## 2023-09-12 MED ORDER — ATORVASTATIN CALCIUM 10 MG PO TABS: 10.0000 mg | ORAL_TABLET | Freq: Every day | ORAL | 3 refills | Status: DC

## 2023-09-12 MED ORDER — AMLODIPINE-OLMESARTAN 5-20 MG PO TABS: 1.0000 | ORAL_TABLET | Freq: Every day | ORAL | 3 refills | Status: AC

## 2023-09-12 NOTE — Progress Notes (Signed)
Complete physical exam  Patient: Ernest Hughes.   DOB: [birth year]   78 y.o. Male  MRN: [id number]  Subjective:    Chief Complaint  Patient presents with   Annual Exam    Fasting.    Diabetes    Ernest Hughes. is a 78 y.o. male who presents today for a complete physical exam. He reports consuming a general diet. Home exercise routine includes walking 2-3 hrs per week. Gym/ health club routine includes low impact aerobics, mod to heavy weightlifting, and stationary bike. He generally feels well. He reports sleeping fairly well. Ernest Hughes  History of prostate cancer and subsequent radiation.  He has been on Flomax twice per day for quite some time.  He does have some difficulty with nocturia x 3.  Is also had difficulty with ADD and has not had much success with Cialis.  He does have a history of cataracts and has had surgery.Ernest Hughes  He also has a history of diabetes however his last few A1c hemoglobin A1c's look good.  His arthritis symptoms seem to be under better control.  His reflux seems to be causing very little difficulty.  He is retired.  Home life is quite stable.  Otherwise family and social history as well as health maintenance and immunizations was reviewed   Most recent fall risk assessment:    09/11/2023   10:46 AM  Fall Risk   Falls in the past year? 1  Comment passed out  Number falls in past yr: 0  Injury with Fall? 0  Risk for fall due to : Medication side effect  Follow up Falls prevention discussed;Falls evaluation completed     Most recent depression screenings:    09/11/2023   11:51 AM 09/05/2022   11:55 AM  PHQ 2/9 Scores  PHQ - 2 Score 0 0  PHQ- 9 Score 2     Vision:Within last year and Dental: No current dental problems and Last dental visit: October 2024    Patient Care Team: Ronnald Nian, MD as PCP - General (Family Medicine) Myrtie Neither Andreas Blower, MD as Consulting Physician (Gastroenterology)   Outpatient Medications Prior to Visit  Medication Sig    acetaminophen (TYLENOL) 500 MG tablet Take 500 mg by mouth every 6 (six) hours as needed.   alum & mag hydroxide-simeth (MAALOX PLUS) 400-400-40 MG/5ML suspension Take 5 mLs by mouth every 6 (six) hours as needed for indigestion.   amLODipine-olmesartan (AZOR) 5-20 MG tablet Take 1 tablet by mouth daily.   atorvastatin (LIPITOR) 10 MG tablet Take 1 tablet (10 mg total) by mouth daily.   cetaphil (CETAPHIL) lotion Apply 1 Application topically daily.   diclofenac sodium (VOLTAREN) 1 % GEL Apply topically 4 (four) times daily.   erythromycin ophthalmic ointment Place 1 Application into the left eye as needed.   Ginkgo Biloba 40 MG TABS Take 120 mg by mouth daily.   Ibuprofen (ADVIL PO) Take by mouth.   levocetirizine (XYZAL) 5 MG tablet Take 5 mg by mouth daily.   loratadine (CLARITIN) 10 MG tablet Take 10 mg by mouth daily.   Polyvinyl Alcohol-Povidone (REFRESH OP)    psyllium (METAMUCIL) 58.6 % powder Take 1 packet by mouth daily.   tadalafil (CIALIS) 5 MG tablet Take 1 tablet (5 mg total) by mouth daily.   tamsulosin (FLOMAX) 0.4 MG CAPS capsule Take 1 capsule (0.4 mg total) by mouth 2 (two) times daily.   doxepin (SINEQUAN) 10 MG capsule Take 10-30 mg by  mouth as needed. (Patient not taking: Reported on 09/12/2023)   No facility-administered medications prior to visit.    Review of Systems  All other systems reviewed and are negative.         Objective:       Physical Exam  Alert and in no distress. Tympanic membranes and canals are normal. Pharyngeal area is normal. Neck is supple without adenopathy or thyromegaly. Cardiac exam shows a regular sinus rhythm without murmurs or gallops. Lungs are clear to auscultation.  Foot exam is normal except for onychomycosis Hemoglobin A1c is 6.1     Assessment & Plan:    Routine general medical examination at a health care facility  Arthritis  Benign prostatic hyperplasia with nocturia  Erectile dysfunction due to arterial  insufficiency  Gastroesophageal reflux disease without esophagitis  History of prostate cancer - Plan: PSA  Hyperlipidemia associated with type 2 diabetes mellitus (HCC) - Plan: Lipid panel, atorvastatin (LIPITOR) 10 MG tablet  Hypertension associated with diabetes (HCC) - Plan: CBC with Differential/Platelet, Comprehensive metabolic panel, amLODipine-olmesartan (AZOR) 5-20 MG tablet  Type 2 diabetes mellitus without complication, without long-term current use of insulin (HCC) - Plan: POCT glycosylated hemoglobin (Hb A1C), CBC with Differential/Platelet, Comprehensive metabolic panel, Lipid panel, POCT UA - Microalbumin  History of cataract surgery, unspecified laterality  Onychomycosis PSA ordered at his request.  Discussed treatment of the onychomycosis and at this time no therapies will be prescribed.  He was comfortable with that.  He is to discuss with urology the erectile dysfunction.  He will continue on his present medication regimen. Immunization History  Administered Date(s) Administered   Fluad Quad(high Dose 65+) 04/15/2019, 05/25/2020   Fluad Trivalent(High Dose 65+) 05/01/2023   Influenza Split 05/22/2011, 05/02/2012   Influenza Whole 08/05/2003, 06/11/2006   Influenza, High Dose Seasonal PF 05/19/2013, 05/05/2014, 04/20/2015, 06/16/2016, 04/24/2017   Influenza-Unspecified 06/14/2018, 04/28/2021, 02/23/2022   Moderna SARS-COV2 Booster Vaccination 05/04/2022   PFIZER Comirnaty(Gray Top)Covid-19 Tri-Sucrose Vaccine 12/09/2020   PFIZER(Purple Top)SARS-COV-2 Vaccination 09/03/2019, 09/24/2019, 08/10/2020   PNEUMOCOCCAL CONJUGATE-20 06/12/2023   PPD Test 11/01/2009   Pfizer Covid-19 Vaccine Bivalent Booster 40yrs & up 05/05/2021   Pfizer(Comirnaty)Fall Seasonal Vaccine 12 years and older 04/17/2023   Pneumococcal Conjugate-13 01/29/2008   Pneumococcal Polysaccharide-23 05/05/2014   Rsv, Bivalent, Protein Subunit Rsvpref,pf (Abrysvo) 05/31/2022   Td 06/14/2005   Tdap  05/14/2019   Zoster Recombinant(Shingrix) 06/06/2017, 11/07/2017   Zoster, Live 06/11/2006    Health Maintenance  Topic Date Due   FOOT EXAM  Never done   OPHTHALMOLOGY EXAM  Never done   COVID-19 Vaccine (7 - 2024-25 season) 06/12/2023   Diabetic kidney evaluation - eGFR measurement  09/13/2023   Diabetic kidney evaluation - Urine ACR  02/14/2024 (Originally 01/06/1964)   HEMOGLOBIN A1C  10/29/2023   Medicare Annual Wellness (AWV)  09/10/2024   DTaP/Tdap/Td (3 - Td or Tdap) 05/13/2029   Pneumonia Vaccine 33+ Years old  Completed   INFLUENZA VACCINE  Completed   Hepatitis C Screening  Completed   Zoster Vaccines- Shingrix  Completed   HPV VACCINES  Aged Out   Fecal DNA (Cologuard)  Discontinued    Discussed health benefits of physical activity, and encouraged him to engage in regular exercise appropriate for his age and condition.  Problem List Items Addressed This Visit       Cardiovascular and Mediastinum   Hypertension associated with diabetes [facility])     Digestive   GERD     Endocrine   Hyperlipidemia associated with  type 2 diabetes mellitus (HCC)   New onset type 2 diabetes mellitus (HCC)     Musculoskeletal and Integument   Arthritis     Other   History of prostate cancer   Erectile dysfunction   Benign prostatic hyperplasia with nocturia   Other Visit Diagnoses       Routine general medical examination at a health care facility    -  Primary      No follow-ups on file.     Lawernce Pitts, CMA

## 2023-09-12 NOTE — Patient Instructions (Signed)
Cut back on fluids within several hours of bedtime and make sure you empty your bladder before you go to bed

## 2023-09-13 ENCOUNTER — Encounter: Payer: Self-pay | Admitting: Family Medicine

## 2023-09-13 LAB — LIPID PANEL
Cholesterol, Total: 132 mg/dL (ref 100–199)
HDL: 55 mg/dL (ref >39)
LDL CALC COMMENT:: 2.4 ratio (ref 0.0–5.0)
LDL Chol Calc (NIH): 61 mg/dL (ref 0–99)
Triglycerides: 83 mg/dL (ref 0–149)
VLDL Cholesterol Cal: 16 mg/dL (ref 5–40)

## 2023-09-13 LAB — COMPREHENSIVE METABOLIC PANEL
ALT: 15 IU/L (ref 0–44)
AST: 17 IU/L (ref 0–40)
Albumin: 4.2 g/dL (ref 3.8–4.8)
Alkaline Phosphatase: 79 IU/L (ref 44–121)
BUN/Creatinine Ratio: 16 (ref 10–24)
BUN: 15 mg/dL (ref 8–27)
Bilirubin Total: 0.5 mg/dL (ref 0.0–1.2)
CO2: 24 mmol/L (ref 20–29)
Calcium: 8.9 mg/dL (ref 8.6–10.2)
Chloride: 103 mmol/L (ref 96–106)
Creatinine, Ser: 0.95 mg/dL (ref 0.76–1.27)
Globulin, Total: 3.3 g/dL (ref 1.5–4.5)
Glucose: 97 mg/dL (ref 70–99)
Potassium: 3.5 mmol/L (ref 3.5–5.2)
Sodium: 140 mmol/L (ref 134–144)
Total Protein: 7.5 g/dL (ref 6.0–8.5)
eGFR: 82 mL/min/{1.73_m2} (ref >59)

## 2023-09-13 LAB — CBC WITH DIFFERENTIAL/PLATELET
Basophils Absolute: 0 10*3/uL (ref 0.0–0.2)
Basos: 0 %
EOS (ABSOLUTE): 0.1 10*3/uL (ref 0.0–0.4)
Eos: 1 %
Hematocrit: 44.6 % (ref 37.5–51.0)
Hemoglobin: 14.8 g/dL (ref 13.0–17.7)
Immature Grans (Abs): 0 10*3/uL (ref 0.0–0.1)
Immature Granulocytes: 0 %
Lymphocytes Absolute: 2.3 10*3/uL (ref 0.7–3.1)
Lymphs: 49 %
MCH: 29.4 pg (ref 26.6–33.0)
MCHC: 33.2 g/dL (ref 31.5–35.7)
MCV: 89 fL (ref 79–97)
Monocytes Absolute: 0.3 10*3/uL (ref 0.1–0.9)
Monocytes: 7 %
Neutrophils Absolute: 2 10*3/uL (ref 1.4–7.0)
Neutrophils: 43 %
Platelets: 161 10*3/uL (ref 150–450)
RBC: 5.04 x10E6/uL (ref 4.14–5.80)
RDW: 14.3 % (ref 11.6–15.4)
WBC: 4.7 10*3/uL (ref 3.4–10.8)

## 2023-09-13 LAB — PSA: Prostate Specific Ag, Serum: 0.2 ng/mL (ref 0.0–4.0)

## 2023-09-21 DIAGNOSIS — C61 Malignant neoplasm of prostate: Secondary | ICD-10-CM | POA: Diagnosis not present

## 2023-09-28 DIAGNOSIS — R351 Nocturia: Secondary | ICD-10-CM | POA: Diagnosis not present

## 2023-09-28 DIAGNOSIS — N401 Enlarged prostate with lower urinary tract symptoms: Secondary | ICD-10-CM | POA: Diagnosis not present

## 2023-09-28 DIAGNOSIS — N5201 Erectile dysfunction due to arterial insufficiency: Secondary | ICD-10-CM | POA: Diagnosis not present

## 2023-09-28 DIAGNOSIS — C61 Malignant neoplasm of prostate: Secondary | ICD-10-CM | POA: Diagnosis not present

## 2023-10-09 ENCOUNTER — Encounter: Payer: Self-pay | Admitting: Internal Medicine

## 2023-11-06 ENCOUNTER — Ambulatory Visit: Payer: Medicare PPO | Admitting: Family Medicine

## 2023-11-20 ENCOUNTER — Ambulatory Visit
Admission: RE | Admit: 2023-11-20 | Discharge: 2023-11-20 | Disposition: A | Discharge status: Home / Self Care | Source: Ambulatory Visit | Attending: Family Medicine | Admitting: Family Medicine

## 2023-11-20 ENCOUNTER — Ambulatory Visit: Admitting: Family Medicine

## 2023-11-20 ENCOUNTER — Encounter: Payer: Self-pay | Admitting: Family Medicine

## 2023-11-20 VITALS — BP 130/86 | HR 67 | Wt 204.4 lb

## 2023-11-20 DIAGNOSIS — Z8546 Personal history of malignant neoplasm of prostate: Secondary | ICD-10-CM | POA: Diagnosis not present

## 2023-11-20 DIAGNOSIS — M25552 Pain in left hip: Secondary | ICD-10-CM | POA: Diagnosis not present

## 2023-11-20 DIAGNOSIS — M25562 Pain in left knee: Secondary | ICD-10-CM

## 2023-11-20 MED ORDER — TRAMADOL HCL 50 MG PO TABS: 50.0000 mg | ORAL_TABLET | Freq: Three times a day (TID) | ORAL | 0 refills | Status: AC | PRN

## 2023-11-20 NOTE — Progress Notes (Signed)
   Subjective:    Patient ID: Ernest Hughes., male    DOB: [birth year], 78 y.o.   MRN: [id number]  HPI He complains of recent onset of left hip pain that he describes as throbbing and can last the entire day.  He states it does seem to get slightly worse with sitting and with standing but does not bother him as much when he walks.  He has tried ibuprofen but apparently this causes difficulty with urinary symptoms.  He has a previous history of prostate cancer but recently saw urology and apparently his PSA was in the normal range.  No history of recent injury or change in physical activity.   Review of Systems     Objective:    Physical Exam There is some pain with motion of the hip.  Knee and ankle motion causes no symptoms.  Pulses are normal in the femoral and dorsalis pedis areas.  Reflexes are normal.       Assessment & Plan:  Left hip pain - Plan: DG Hip Unilat W OR W/O Pelvis 2-3 Views Left, traMADol (ULTRAM) 50 MG tablet  History of prostate cancer He states that his pain is his entire leg but it seems to be mainly worse with motion of the hip.  Since he has not gotten good relief with the NSAID I will go with tramadol.  4/10 explained that the hip x-ray is negative.  He then explained that it seemed to be more in the knee area or even distal to that.  It was again difficult to get him to localize but seems to be mainly now an issue in the area of the knee.

## 2023-11-22 NOTE — Addendum Note (Signed)
 Addended by: Ronnald Nian on: 11/22/2023 05:28 PM   Modules accepted: Orders

## 2023-11-23 ENCOUNTER — Telehealth: Payer: Self-pay | Admitting: Internal Medicine

## 2023-11-23 ENCOUNTER — Ambulatory Visit
Admission: RE | Admit: 2023-11-23 | Discharge: 2023-11-23 | Disposition: A | Discharge status: Home / Self Care | Source: Ambulatory Visit | Attending: Family Medicine | Admitting: Family Medicine

## 2023-11-23 DIAGNOSIS — M25562 Pain in left knee: Secondary | ICD-10-CM | POA: Diagnosis not present

## 2023-11-23 DIAGNOSIS — M1712 Unilateral primary osteoarthritis, left knee: Secondary | ICD-10-CM | POA: Diagnosis not present

## 2023-11-23 NOTE — Telephone Encounter (Signed)
 Copied from CRM [id number]. Topic: Clinical - Lab/Test Results >> Nov 22, 2023  9:09 AM Geroge Baseman wrote: Reason for CRM: Patient is wanting to know if he can possibly get his imaging results today. I informed him at this time the results were "not released". Would like a call back as soon as the results are available.

## 2023-11-26 ENCOUNTER — Encounter: Payer: Self-pay | Admitting: Family Medicine

## 2023-11-28 ENCOUNTER — Encounter: Payer: Self-pay | Admitting: Family Medicine

## 2023-11-28 ENCOUNTER — Ambulatory Visit: Admitting: Family Medicine

## 2023-11-28 VITALS — BP 110/82 | HR 92 | Wt 205.4 lb

## 2023-11-28 DIAGNOSIS — M1712 Unilateral primary osteoarthritis, left knee: Secondary | ICD-10-CM

## 2023-11-28 MED ORDER — LIDOCAINE HCL 2 % IJ SOLN
3.0000 mL | Freq: Once | INTRAMUSCULAR | Status: AC
  Administered 2023-11-28: 60 mg via INTRADERMAL

## 2023-11-28 MED ORDER — TRIAMCINOLONE ACETONIDE 40 MG/ML IJ SUSP
40.0000 mg | Freq: Once | INTRAMUSCULAR | Status: AC
  Administered 2023-11-28: 40 mg via INTRAMUSCULAR

## 2023-11-28 NOTE — Addendum Note (Signed)
 Addended by: Eddye Goodie on: 11/28/2023 04:50 PM   Modules accepted: Orders

## 2023-11-28 NOTE — Progress Notes (Signed)
   Subjective:    Patient ID: Ernest Poles., male    DOB: [birth year], 78 y.o.   MRN: [id number]  HPI He is here for a joint injection.  He was seen recently for evaluation of hip and knee discomfort.  The x-ray did show some tricompartmental changes.   Review of Systems     Objective:    Physical Exam Alert and in no distress otherwise not examined       Assessment & Plan:  Arthritis of knee, left The knee was prepped with Betadine laterally and the joint line was identified without difficulty.  40 mg of Kenalog and 3 cc of Xylocaine was injected into the joint without difficulty.  He tolerated the procedure well and did note within several minutes at least a 50% reduction in his pain.  Discussed the possibility of giving another shot hopefully at a much later distance down the road.

## 2024-01-14 DIAGNOSIS — M25462 Effusion, left knee: Secondary | ICD-10-CM | POA: Diagnosis not present

## 2024-01-22 DIAGNOSIS — M1712 Unilateral primary osteoarthritis, left knee: Secondary | ICD-10-CM | POA: Diagnosis not present

## 2024-01-22 DIAGNOSIS — R262 Difficulty in walking, not elsewhere classified: Secondary | ICD-10-CM | POA: Diagnosis not present

## 2024-01-30 DIAGNOSIS — R262 Difficulty in walking, not elsewhere classified: Secondary | ICD-10-CM | POA: Diagnosis not present

## 2024-01-30 DIAGNOSIS — M1712 Unilateral primary osteoarthritis, left knee: Secondary | ICD-10-CM | POA: Diagnosis not present

## 2024-02-01 LAB — LAB REPORT - SCANNED: Creatinine, POC: 13 mg/dL

## 2024-02-21 DIAGNOSIS — H43811 Vitreous degeneration, right eye: Secondary | ICD-10-CM | POA: Diagnosis not present

## 2024-02-21 DIAGNOSIS — H0102A Squamous blepharitis right eye, upper and lower eyelids: Secondary | ICD-10-CM | POA: Diagnosis not present

## 2024-02-21 DIAGNOSIS — Z961 Presence of intraocular lens: Secondary | ICD-10-CM | POA: Diagnosis not present

## 2024-02-21 DIAGNOSIS — H47323 Drusen of optic disc, bilateral: Secondary | ICD-10-CM | POA: Diagnosis not present

## 2024-02-21 DIAGNOSIS — H0102B Squamous blepharitis left eye, upper and lower eyelids: Secondary | ICD-10-CM | POA: Diagnosis not present

## 2024-02-21 DIAGNOSIS — H10413 Chronic giant papillary conjunctivitis, bilateral: Secondary | ICD-10-CM | POA: Diagnosis not present

## 2024-02-21 DIAGNOSIS — D3102 Benign neoplasm of left conjunctiva: Secondary | ICD-10-CM | POA: Diagnosis not present

## 2024-02-21 DIAGNOSIS — H04522 Eversion of left lacrimal punctum: Secondary | ICD-10-CM | POA: Diagnosis not present

## 2024-03-12 DIAGNOSIS — L731 Pseudofolliculitis barbae: Secondary | ICD-10-CM | POA: Diagnosis not present

## 2024-03-12 DIAGNOSIS — L219 Seborrheic dermatitis, unspecified: Secondary | ICD-10-CM | POA: Diagnosis not present

## 2024-03-12 DIAGNOSIS — L304 Erythema intertrigo: Secondary | ICD-10-CM | POA: Diagnosis not present

## 2024-03-12 DIAGNOSIS — L509 Urticaria, unspecified: Secondary | ICD-10-CM | POA: Diagnosis not present

## 2024-03-17 DIAGNOSIS — M1712 Unilateral primary osteoarthritis, left knee: Secondary | ICD-10-CM | POA: Diagnosis not present

## 2024-04-21 ENCOUNTER — Telehealth: Payer: Self-pay

## 2024-04-21 NOTE — Telephone Encounter (Signed)
 Called patient advised him his prescription will be in the office.

## 2024-04-21 NOTE — Telephone Encounter (Signed)
 Copied from CRM [id number]. Topic: Clinical - Medication Question >> Apr 21, 2024 10:45 AM Graeme ORN wrote: Reason for CRM: Patient requesting Covid Vaccine for him and his spouse. Called [facility] - not available. Would like to have Rxs sent to [facility] if possible. Thank You

## 2024-04-22 DIAGNOSIS — H0288B Meibomian gland dysfunction left eye, upper and lower eyelids: Secondary | ICD-10-CM | POA: Diagnosis not present

## 2024-04-22 DIAGNOSIS — H0279 Other degenerative disorders of eyelid and periocular area: Secondary | ICD-10-CM | POA: Diagnosis not present

## 2024-04-22 DIAGNOSIS — H04222 Epiphora due to insufficient drainage, left lacrimal gland: Secondary | ICD-10-CM | POA: Diagnosis not present

## 2024-04-22 DIAGNOSIS — H04221 Epiphora due to insufficient drainage, right lacrimal gland: Secondary | ICD-10-CM | POA: Diagnosis not present

## 2024-04-22 DIAGNOSIS — H02115 Cicatricial ectropion of left lower eyelid: Secondary | ICD-10-CM | POA: Diagnosis not present

## 2024-04-22 DIAGNOSIS — H02145 Spastic ectropion of left lower eyelid: Secondary | ICD-10-CM | POA: Diagnosis not present

## 2024-04-22 DIAGNOSIS — H02135 Senile ectropion of left lower eyelid: Secondary | ICD-10-CM | POA: Diagnosis not present

## 2024-04-22 DIAGNOSIS — H0288A Meibomian gland dysfunction right eye, upper and lower eyelids: Secondary | ICD-10-CM | POA: Diagnosis not present

## 2024-04-22 DIAGNOSIS — H04223 Epiphora due to insufficient drainage, bilateral lacrimal glands: Secondary | ICD-10-CM | POA: Diagnosis not present

## 2024-08-04 LAB — OPHTHALMOLOGY REPORT-SCANNED

## 2024-08-19 DIAGNOSIS — E1169 Type 2 diabetes mellitus with other specified complication: Secondary | ICD-10-CM

## 2024-08-19 NOTE — Telephone Encounter (Signed)
"  Pt has an appt in April   "

## 2024-09-16 ENCOUNTER — Ambulatory Visit: Payer: Medicare PPO | Admitting: *Deleted

## 2024-09-16 VITALS — BP 144/84 | Ht 69.5 in | Wt 206.8 lb

## 2024-09-16 DIAGNOSIS — Z0000 Encounter for general adult medical examination without abnormal findings: Secondary | ICD-10-CM

## 2024-10-22 ENCOUNTER — Ambulatory Visit: Payer: Medicare PPO | Admitting: Family Medicine
